# Patient Record
Sex: Female | Born: 1985 | Race: White | Hispanic: No | Marital: Married | Smoking: Former smoker
Health system: Southern US, Community
[De-identification: ages and names within clinical notes are randomized; demographics above are authoritative.]

## PROBLEM LIST (undated history)

## (undated) ENCOUNTER — Inpatient Hospital Stay (HOSPITAL_COMMUNITY): Payer: Self-pay

## (undated) DIAGNOSIS — G43909 Migraine, unspecified, not intractable, without status migrainosus: Secondary | ICD-10-CM

## (undated) DIAGNOSIS — F419 Anxiety disorder, unspecified: Secondary | ICD-10-CM

## (undated) DIAGNOSIS — Z8659 Personal history of other mental and behavioral disorders: Secondary | ICD-10-CM

## (undated) DIAGNOSIS — K529 Noninfective gastroenteritis and colitis, unspecified: Secondary | ICD-10-CM

## (undated) DIAGNOSIS — D241 Benign neoplasm of right breast: Secondary | ICD-10-CM

## (undated) DIAGNOSIS — Z3490 Encounter for supervision of normal pregnancy, unspecified, unspecified trimester: Secondary | ICD-10-CM

## (undated) DIAGNOSIS — K219 Gastro-esophageal reflux disease without esophagitis: Secondary | ICD-10-CM

## (undated) DIAGNOSIS — O24419 Gestational diabetes mellitus in pregnancy, unspecified control: Secondary | ICD-10-CM

## (undated) DIAGNOSIS — F3281 Premenstrual dysphoric disorder: Secondary | ICD-10-CM

## (undated) DIAGNOSIS — Z8632 Personal history of gestational diabetes: Principal | ICD-10-CM

## (undated) DIAGNOSIS — D242 Benign neoplasm of left breast: Secondary | ICD-10-CM

## (undated) DIAGNOSIS — Z8759 Personal history of other complications of pregnancy, childbirth and the puerperium: Secondary | ICD-10-CM

## (undated) HISTORY — DX: Benign neoplasm of right breast: D24.2

## (undated) HISTORY — DX: Personal history of other complications of pregnancy, childbirth and the puerperium: Z87.59

## (undated) HISTORY — DX: Gestational diabetes mellitus in pregnancy, unspecified control: O24.419

## (undated) HISTORY — DX: Encounter for supervision of normal pregnancy, unspecified, unspecified trimester: Z34.90

## (undated) HISTORY — DX: Personal history of other mental and behavioral disorders: Z86.59

## (undated) HISTORY — DX: Personal history of gestational diabetes: Z86.32

## (undated) HISTORY — PX: SEPTOPLASTY: SUR1290

## (undated) HISTORY — PX: MANDIBLE SURGERY: SHX707

## (undated) HISTORY — DX: Premenstrual dysphoric disorder: F32.81

## (undated) HISTORY — DX: Benign neoplasm of right breast: D24.1

## (undated) HISTORY — DX: Noninfective gastroenteritis and colitis, unspecified: K52.9

---

## 2011-09-14 ENCOUNTER — Encounter: Payer: Self-pay | Admitting: Family

## 2011-09-14 ENCOUNTER — Ambulatory Visit (INDEPENDENT_AMBULATORY_CARE_PROVIDER_SITE_OTHER): Payer: Medicaid Other | Admitting: Family

## 2011-09-14 VITALS — BP 133/83 | Wt 186.0 lb

## 2011-09-14 DIAGNOSIS — Z3490 Encounter for supervision of normal pregnancy, unspecified, unspecified trimester: Secondary | ICD-10-CM

## 2011-09-14 DIAGNOSIS — Z3480 Encounter for supervision of other normal pregnancy, unspecified trimester: Secondary | ICD-10-CM

## 2011-09-14 HISTORY — DX: Encounter for supervision of normal pregnancy, unspecified, unspecified trimester: Z34.90

## 2011-09-14 NOTE — Progress Notes (Signed)
Pt transfer from [facility]; no problems during pregnancy; discussed normal growing pains and warning signs.  Records not available at this visit>will obtain.  Schedule anatomy ultrasound;  Reviewed the practice and call coverage.

## 2011-09-18 ENCOUNTER — Ambulatory Visit (HOSPITAL_COMMUNITY)
Admission: RE | Admit: 2011-09-18 | Discharge: 2011-09-18 | Disposition: A | Discharge status: Home / Self Care | Payer: Medicaid Other | Source: Ambulatory Visit | Attending: Family | Admitting: Family

## 2011-09-18 DIAGNOSIS — Z3689 Encounter for other specified antenatal screening: Secondary | ICD-10-CM | POA: Insufficient documentation

## 2011-09-18 DIAGNOSIS — Z3490 Encounter for supervision of normal pregnancy, unspecified, unspecified trimester: Secondary | ICD-10-CM

## 2011-09-19 ENCOUNTER — Encounter: Payer: Self-pay | Admitting: Family

## 2011-10-12 ENCOUNTER — Ambulatory Visit: Payer: Medicaid Other | Admitting: Family

## 2011-10-12 ENCOUNTER — Encounter: Payer: Self-pay | Admitting: Family

## 2011-10-12 VITALS — BP 123/82 | Wt 197.0 lb

## 2011-10-12 DIAGNOSIS — R350 Frequency of micturition: Secondary | ICD-10-CM

## 2011-10-12 DIAGNOSIS — Z3490 Encounter for supervision of normal pregnancy, unspecified, unspecified trimester: Secondary | ICD-10-CM

## 2011-10-12 MED ORDER — NITROFURANTOIN MONOHYD MACRO 100 MG PO CAPS: 100.0000 mg | ORAL_CAPSULE | Freq: Two times a day (BID) | ORAL | Status: AC

## 2011-10-12 NOTE — Progress Notes (Signed)
Routine prenatal check.  Patient has questions about her pulse rate.  Today it is 100, she says its been running around 80 and she is very aware of it, she is concerned about high blood pressure.  She also feels like she is having a UTI, urine dip is negative for leukocytes and blood.  Complains of frequency and burning.

## 2011-10-12 NOTE — Progress Notes (Signed)
Multiple questions and worries about body aches, bilat leg and arm cramps; pulse increases with walking up stairs, no chest pain associated, resolves when activity completed; reassured pt that pregnancy progressing normal ; dysuria>Macrobid, urine culture.

## 2011-10-14 LAB — URINE CULTURE
Colony Count: NO GROWTH
Organism ID, Bacteria: NO GROWTH

## 2011-10-21 ENCOUNTER — Ambulatory Visit (HOSPITAL_COMMUNITY)
Admission: RE | Admit: 2011-10-21 | Discharge: 2011-10-21 | Disposition: A | Discharge status: Home / Self Care | Payer: Medicaid Other | Source: Ambulatory Visit | Attending: Family | Admitting: Family

## 2011-10-21 DIAGNOSIS — Z3490 Encounter for supervision of normal pregnancy, unspecified, unspecified trimester: Secondary | ICD-10-CM

## 2011-10-21 DIAGNOSIS — Z3689 Encounter for other specified antenatal screening: Secondary | ICD-10-CM | POA: Insufficient documentation

## 2011-10-22 ENCOUNTER — Encounter: Payer: Medicaid Other | Admitting: Obstetrics & Gynecology

## 2011-10-26 ENCOUNTER — Encounter: Payer: Self-pay | Admitting: Family

## 2011-11-09 ENCOUNTER — Ambulatory Visit (INDEPENDENT_AMBULATORY_CARE_PROVIDER_SITE_OTHER): Payer: Medicaid Other | Admitting: Advanced Practice Midwife

## 2011-11-09 VITALS — BP 117/77 | Ht 65.0 in | Wt 208.0 lb

## 2011-11-09 DIAGNOSIS — Z3490 Encounter for supervision of normal pregnancy, unspecified, unspecified trimester: Secondary | ICD-10-CM

## 2011-11-09 DIAGNOSIS — Z3400 Encounter for supervision of normal first pregnancy, unspecified trimester: Secondary | ICD-10-CM

## 2011-11-09 LAB — CBC WITH DIFFERENTIAL/PLATELET
Basophils Relative: 1 % (ref 0–1)
Eosinophils Absolute: 0.2 10*3/uL (ref 0.0–0.7)
Eosinophils Relative: 2 % (ref 0–5)
Hemoglobin: 10.4 g/dL — ABNORMAL LOW (ref 12.0–15.0)
Lymphs Abs: 1.7 10*3/uL (ref 0.7–4.0)
MCH: 29.2 pg (ref 26.0–34.0)
MCHC: 33.7 g/dL (ref 30.0–36.0)
MCV: 86.8 fL (ref 78.0–100.0)
Monocytes Relative: 8 % (ref 3–12)
Neutrophils Relative %: 70 % (ref 43–77)
Platelets: 285 10*3/uL (ref 150–400)
RBC: 3.56 MIL/uL — ABNORMAL LOW (ref 3.87–5.11)

## 2011-11-09 NOTE — Patient Instructions (Signed)
Pregnancy - Third Trimester The third trimester of pregnancy (the last 3 months) is a period of the most rapid growth for you and your baby. The baby approaches a length of 20 inches and a weight of 6 to 10 pounds. The baby is adding on fat and getting ready for life outside your body. While inside, babies have periods of sleeping and waking, suck their thumbs, and hiccups. You can often feel small contractions of the uterus. This is false labor. It is also called Braxton-Hicks contractions. This is like a practice for labor. The usual problems in this stage of pregnancy include more difficulty breathing, swelling of the hands and feet from water retention, and having to urinate more often because of the uterus and baby pressing on your bladder.  PRENATAL EXAMS  Blood work may continue to be done during prenatal exams. These tests are done to check on your health and the probable health of your baby. Blood work is used to follow your blood levels (hemoglobin). Anemia (low hemoglobin) is common during pregnancy. Iron and vitamins are given to help prevent this. You may also continue to be checked for diabetes. Some of the past blood tests may be done again.   The size of the uterus is measured during each visit. This makes sure your baby is growing properly according to your pregnancy dates.   Your blood pressure is checked every prenatal visit. This is to make sure you are not getting toxemia.   Your urine is checked every prenatal visit for infection, diabetes and protein.   Your weight is checked at each visit. This is done to make sure gains are happening at the suggested rate and that you and your baby are growing normally.   Sometimes, an ultrasound is performed to confirm the position and the proper growth and development of the baby. This is a test done that bounces harmless sound waves off the baby so your caregiver can more accurately determine due dates.   Discuss the type of pain  medication and anesthesia you will have during your labor and delivery.   Discuss the possibility and anesthesia if a Cesarean Section might be necessary.   Inform your caregiver if there is any mental or physical violence at home.  Sometimes, a specialized non-stress test, contraction stress test and biophysical profile are done to make sure the baby is not having a problem. Checking the amniotic fluid surrounding the baby is called an amniocentesis. The amniotic fluid is removed by sticking a needle into the belly (abdomen). This is sometimes done near the end of pregnancy if an early delivery is required. In this case, it is done to help make sure the baby's lungs are mature enough for the baby to live outside of the womb. If the lungs are not mature and it is unsafe to deliver the baby, an injection of cortisone medication is given to the mother 1 to 2 days before the delivery. This helps the baby's lungs mature and makes it safer to deliver the baby. CHANGES OCCURING IN THE THIRD TRIMESTER OF PREGNANCY Your body goes through many changes during pregnancy. They vary from person to person. Talk to your caregiver about changes you notice and are concerned about.  During the last trimester, you have probably had an increase in your appetite. It is normal to have cravings for certain foods. This varies from person to person and pregnancy to pregnancy.   You may begin to get stretch marks on your hips,   abdomen, and breasts. These are normal changes in the body during pregnancy. There are no exercises or medications to take which prevent this change.   Constipation may be treated with a stool softener or adding bulk to your diet. Drinking lots of fluids, fiber in vegetables, fruits, and whole grains are helpful.   Exercising is also helpful. If you have been very active up until your pregnancy, most of these activities can be continued during your pregnancy. If you have been less active, it is helpful  to start an exercise program such as walking. Consult your caregiver before starting exercise programs.   Avoid all smoking, alcohol, un-prescribed drugs, herbs and "street drugs" during your pregnancy. These chemicals affect the formation and growth of the baby. Avoid chemicals throughout the pregnancy to ensure the delivery of a healthy infant.   Backache, varicose veins and hemorrhoids may develop or get worse.   You will tire more easily in the third trimester, which is normal.   The baby's movements may be stronger and more often.   You may become short of breath easily.   Your belly button may stick out.   A yellow discharge may leak from your breasts called colostrum.   You may have a bloody mucus discharge. This usually occurs a few days to a week before labor begins.  HOME CARE INSTRUCTIONS   Keep your caregiver's appointments. Follow your caregiver's instructions regarding medication use, exercise, and diet.   During pregnancy, you are providing food for you and your baby. Continue to eat regular, well-balanced meals. Choose foods such as meat, fish, milk and other low fat dairy products, vegetables, fruits, and whole-grain breads and cereals. Your caregiver will tell you of the ideal weight gain.   A physical sexual relationship may be continued throughout pregnancy if there are no other problems such as early (premature) leaking of amniotic fluid from the membranes, vaginal bleeding, or belly (abdominal) pain.   Exercise regularly if there are no restrictions. Check with your caregiver if you are unsure of the safety of your exercises. Greater weight gain will occur in the last 2 trimesters of pregnancy. Exercising helps:   Control your weight.   Get you in shape for labor and delivery.   You lose weight after you deliver.   Rest a lot with legs elevated, or as needed for leg cramps or low back pain.   Wear a good support or jogging bra for breast tenderness during  pregnancy. This may help if worn during sleep. Pads or tissues may be used in the bra if you are leaking colostrum.   Do not use hot tubs, steam rooms, or saunas.   Wear your seat belt when driving. This protects you and your baby if you are in an accident.   Avoid raw meat, cat litter boxes and soil used by cats. These carry germs that can cause birth defects in the baby.   It is easier to loose urine during pregnancy. Tightening up and strengthening the pelvic muscles will help with this problem. You can practice stopping your urination while you are going to the bathroom. These are the same muscles you need to strengthen. It is also the muscles you would use if you were trying to stop from passing gas. You can practice tightening these muscles up 10 times a set and repeating this about 3 times per day. Once you know what muscles to tighten up, do not perform these exercises during urination. It is more likely   to cause an infection by backing up the urine.   Ask for help if you have financial, counseling or nutritional needs during pregnancy. Your caregiver will be able to offer counseling for these needs as well as refer you for other special needs.   Make a list of emergency phone numbers and have them available.   Plan on getting help from family or friends when you go home from the hospital.   Make a trial run to the hospital.   Take prenatal classes with the father to understand, practice and ask questions about the labor and delivery.   Prepare the baby's room/nursery.   Do not travel out of the city unless it is absolutely necessary and with the advice of your caregiver.   Wear only low or no heal shoes to have better balance and prevent falling.  MEDICATIONS AND DRUG USE IN PREGNANCY  Take prenatal vitamins as directed. The vitamin should contain 1 milligram of folic acid. Keep all vitamins out of reach of children. Only a couple vitamins or tablets containing iron may be fatal  to a baby or young child when ingested.   Avoid use of all medications, including herbs, over-the-counter medications, not prescribed or suggested by your caregiver. Only take over-the-counter or prescription medicines for pain, discomfort, or fever as directed by your caregiver. Do not use aspirin, ibuprofen (Motrin, Advil, Nuprin) or naproxen (Aleve) unless OK'd by your caregiver.   Let your caregiver also know about herbs you may be using.   Alcohol is related to a number of birth defects. This includes fetal alcohol syndrome. All alcohol, in any form, should be avoided completely. Smoking will cause low birth rate and premature babies.   Street/illegal drugs are very harmful to the baby. They are absolutely forbidden. A baby born to an addicted mother will be addicted at birth. The baby will go through the same withdrawal an adult does.  SEEK MEDICAL CARE IF: You have any concerns or worries during your pregnancy. It is better to call with your questions if you feel they cannot wait, rather than worry about them. DECISIONS ABOUT CIRCUMCISION You may or may not know the sex of your baby. If you know your baby is a boy, it may be time to think about circumcision. Circumcision is the removal of the foreskin of the penis. This is the skin that covers the sensitive end of the penis. There is no proven medical need for this. Often this decision is made on what is popular at the time or based upon religious beliefs and social issues. You can discuss these issues with your caregiver or pediatrician. SEEK IMMEDIATE MEDICAL CARE IF:   An unexplained oral temperature above 102 F (38.9 C) develops, or as your caregiver suggests.   You have leaking of fluid from the vagina (birth canal). If leaking membranes are suspected, take your temperature and tell your caregiver of this when you call.   There is vaginal spotting, bleeding or passing clots. Tell your caregiver of the amount and how many pads are  used.   You develop a bad smelling vaginal discharge with a change in the color from clear to white.   You develop vomiting that lasts more than 24 hours.   You develop chills or fever.   You develop shortness of breath.   You develop burning on urination.   You loose more than 2 pounds of weight or gain more than 2 pounds of weight or as suggested by your   caregiver.   You notice sudden swelling of your face, hands, and feet or legs.   You develop belly (abdominal) pain. Round ligament discomfort is a common non-cancerous (benign) cause of abdominal pain in pregnancy. Your caregiver still must evaluate you.   You develop a severe headache that does not go away.   You develop visual problems, blurred or double vision.   If you have not felt your baby move for more than 1 hour. If you think the baby is not moving as much as usual, eat something with sugar in it and lie down on your left side for an hour. The baby should move at least 4 to 5 times per hour. Call right away if your baby moves less than that.   You fall, are in a car accident or any kind of trauma.   There is mental or physical violence at home.  Document Released: 02/24/2001 Document Revised: 02/19/2011 Document Reviewed: 08/29/2008 [employer] Patient Information 2012 [employer]. 

## 2011-11-09 NOTE — Progress Notes (Signed)
Patient has had some pelvic discomfort, but it is not consistent.  She also has had a cramp twice that was very sharp, like a period cramp.  She has had increase in headaches and loss of appetite since her last visit as well.

## 2011-11-09 NOTE — Progress Notes (Signed)
Doing well. Reviewed treatment for headaches (declines meds now)

## 2011-11-10 LAB — HIV ANTIBODY (ROUTINE TESTING W REFLEX): HIV: NONREACTIVE

## 2011-11-12 LAB — GLUCOSE TOLERANCE, 1 HOUR (50G) W/O FASTING: Glucose, 1 Hour GTT: 150 mg/dL — ABNORMAL HIGH (ref 70–140)

## 2011-11-19 ENCOUNTER — Encounter: Payer: Self-pay | Admitting: Obstetrics & Gynecology

## 2011-11-19 ENCOUNTER — Ambulatory Visit (INDEPENDENT_AMBULATORY_CARE_PROVIDER_SITE_OTHER): Payer: Medicaid Other | Admitting: Obstetrics & Gynecology

## 2011-11-19 VITALS — BP 117/75 | Temp 96.5°F | Wt 212.0 lb

## 2011-11-19 DIAGNOSIS — Z3480 Encounter for supervision of other normal pregnancy, unspecified trimester: Secondary | ICD-10-CM

## 2011-11-19 DIAGNOSIS — R350 Frequency of micturition: Secondary | ICD-10-CM

## 2011-11-19 DIAGNOSIS — O368190 Decreased fetal movements, unspecified trimester, not applicable or unspecified: Secondary | ICD-10-CM

## 2011-11-19 LAB — POCT URINALYSIS DIPSTICK
Bilirubin, UA: NEGATIVE
Blood, UA: NEGATIVE
Glucose, UA: NEGATIVE
Spec Grav, UA: 1.01
pH, UA: 6.5

## 2011-11-19 NOTE — Progress Notes (Signed)
p-99  Lt lower pelvic pain.  Pt is afraid she may have UTI but denies any burning.  She states that baby is moving but not as much today.  Pt is having her 3hr GTT on Monday

## 2011-11-19 NOTE — Progress Notes (Signed)
Pt c/o left lower abdominal pain.  No CVA tenderness.  UA negative.  Cervix closed.  Benign abdominal exam.  Probable round ligament pain.  Pt c/o decreased fetal mvmt.  NST today--reactive GTT on Monday.

## 2011-11-23 ENCOUNTER — Encounter: Payer: Self-pay | Admitting: Obstetrics and Gynecology

## 2011-11-23 ENCOUNTER — Ambulatory Visit (INDEPENDENT_AMBULATORY_CARE_PROVIDER_SITE_OTHER): Payer: Medicaid Other | Admitting: Obstetrics and Gynecology

## 2011-11-23 VITALS — BP 121/73 | Wt 212.0 lb

## 2011-11-23 DIAGNOSIS — R7309 Other abnormal glucose: Secondary | ICD-10-CM

## 2011-11-23 DIAGNOSIS — Z3480 Encounter for supervision of other normal pregnancy, unspecified trimester: Secondary | ICD-10-CM

## 2011-11-23 NOTE — Patient Instructions (Signed)
Gestational Diabetes Mellitus Gestational diabetes mellitus (GDM) is diabetes that occurs only during pregnancy. This happens when the body cannot properly handle the glucose (sugar) that increases in the blood after eating. During pregnancy, insulin resistance (reduced sensitivity to insulin) occurs because of the release of hormones from the placenta. Usually, the pancreas of pregnant women produces enough insulin to overcome the resistance that occurs. However, in gestational diabetes, the insulin is there but it does not work effectively. If the resistance is severe enough that the pancreas does not produce enough insulin, extra glucose builds up in the blood.  WHO IS AT RISK FOR DEVELOPING GESTATIONAL DIABETES?  Women with a history of diabetes in the family.   Women over age 25.   Women who are overweight.   Women in certain ethnic groups (Hispanic, African American, Native American, Asian and Pacific Islander).  WHAT CAN HAPPEN TO THE BABY? If the mother's blood glucose is too high while she is pregnant, the extra sugar will travel through the umbilical cord to the baby. Some of the problems the baby may have are:  Large Baby - If the baby receives too much sugar, the baby will gain more weight. This may cause the baby to be too large to be born normally (vaginally) and a Cesarean section (C-section) may be needed.   Low Blood Glucose (hypoglycemia) - The baby makes extra insulin, in response to the extra sugar its gets from its mother. When the baby is born and no longer needs this extra insulin, the baby's blood glucose level may drop.   Jaundice (yellow coloring of the skin and eyes) - This is fairly common in babies. It is caused from a build-up of the chemical called bilirubin. This is rarely serious, but is seen more often in babies whose mothers had gestational diabetes.  RISKS TO THE MOTHER Women who have had gestational diabetes may be at higher risk for some problems,  including:  Preeclampsia or toxemia, which includes problems with high blood pressure. Blood pressure and protein levels in the urine must be checked frequently.   Infections.   Cesarean section (C-section) for delivery.   Developing Type 2 diabetes later in life. About 30-50% will develop diabetes later, especially if obese.  DIAGNOSIS  The hormones that cause insulin resistance are highest at about 24-28 weeks of pregnancy. If symptoms are experienced, they are much like symptoms you would normally expect during pregnancy.  GDM is often diagnosed using a two part method: 1. After 24-28 weeks of pregnancy, the woman drinks a glucose solution and takes a blood test. If the glucose level is high, a second test will be given.  2. Oral Glucose Tolerance Test (OGTT) which is 3 hours long - After not eating overnight, the blood glucose is checked. The woman drinks a glucose solution, and hourly blood glucose tests are taken.  If the woman has risk factors for GDM, the caregiver may test earlier than 24 weeks of pregnancy. TREATMENT  Treatment of GDM is directed at keeping the mother's blood glucose level normal, and may include:  Meal planning.   Taking insulin or other medicine to control your blood glucose level.   Exercise.   Keeping a daily record of the foods you eat.   Blood glucose monitoring and keeping a record of your blood glucose levels.   May monitor ketone levels in the urine, although this is no longer considered necessary in most pregnancies.  HOME CARE INSTRUCTIONS  While you are pregnant:    Follow your caregiver's advice regarding your prenatal appointments, meal planning, exercise, medicines, vitamins, blood and other tests, and physical activities.   Keep a record of your meals, blood glucose tests, and the amount of insulin you are taking (if any). Show this to your caregiver at every prenatal visit.   If you have GDM, you may have problems with hypoglycemia (low  blood glucose). You may suspect this if you become suddenly dizzy, feel shaky, and/or weak. If you think this is happening and you have a glucose meter, try to test your blood glucose level. Follow your caregiver's advice for when and how to treat your low blood glucose. Generally, the 15:15 rule is followed: Treat by consuming 15 grams of carbohydrates, wait 15 minutes, and recheck blood glucose. Examples of 15 grams of carbohydrates are:   1 cup skim or low-fat milk.    cup juice.   3-4 glucose tablets.   5-6 hard candies.   1 small box raisins.    cup regular soda pop.   Practice good hygiene, to avoid infections.   Do not smoke.  SEEK MEDICAL CARE IF:   You develop abnormal vaginal discharge, with or without itching.   You become weak and tired more than expected.   You seem to sweat a lot.   You have a sudden increase in weight, 5 pounds or more in one week.   You are losing weight, 3 pounds or more in a week.   Your blood glucose level is high, and you need instructions on what to do about it.  SEEK IMMEDIATE MEDICAL CARE IF:   You develop a severe headache.   You faint or pass out.   You develop nausea and vomiting.   You become disoriented or confused.   You have a convulsion.   You develop vision problems.   You develop stomach pain.   You develop vaginal bleeding.   You develop uterine contractions.   You have leaking or a gush of fluid from the vagina.  AFTER YOU HAVE THE BABY:  Go to all of your follow-up appointments, and have blood tests as advised by your caregiver.   Maintain a healthy lifestyle, to prevent diabetes in the future. This includes:   Following a healthy meal plan.   Controlling your weight.   Getting enough exercise and proper rest.   Do not smoke.   Breastfeed your baby if you can. This will lower the chance of you and your baby developing diabetes later in life.  For more information about diabetes, go to the American  Diabetes Association at: www.americandiabetesassociation.org. For more information about gestational diabetes, go to the American Congress of Obstetricians and Gynecologists at: www.acog.org. Document Released: 06/08/2000 Document Revised: 02/19/2011 Document Reviewed: 12/31/2008 [employer] Patient Information 2012 [employer]. 

## 2011-11-23 NOTE — Progress Notes (Signed)
Here for 3 hr GTT. Explained GDM testing and recommended no simple sugars. Had a "glob" of discharge 3 d ago (after VE 4 d ago), none since and no irritation. RLP and ?B-H discussed. Reassured.

## 2011-11-23 NOTE — Progress Notes (Signed)
Patient is here for ob check and 3 hour glucose tolerance testing.

## 2011-11-24 LAB — GLUCOSE TOLERANCE, 3 HOURS
Glucose Tolerance, 1 hour: 216 mg/dL — ABNORMAL HIGH (ref 70–189)
Glucose Tolerance, 2 hour: 145 mg/dL (ref 70–164)
Glucose, GTT - 3 Hour: 158 mg/dL — ABNORMAL HIGH (ref 70–144)

## 2011-12-01 ENCOUNTER — Telehealth: Payer: Self-pay | Admitting: *Deleted

## 2011-12-01 DIAGNOSIS — O24419 Gestational diabetes mellitus in pregnancy, unspecified control: Secondary | ICD-10-CM

## 2011-12-01 MED ORDER — ACCU-CHEK FASTCLIX LANCETS MISC: 1.0000 | Freq: Four times a day (QID) | Status: DC

## 2011-12-01 MED ORDER — GLUCOSE BLOOD VI STRP: ORAL_STRIP | Status: DC

## 2011-12-01 NOTE — Telephone Encounter (Signed)
Pt called requesting a RX be sent to [facility] in [place] for Freestyle light glucose monitoring strips and lancets.  Pt also stated that she went to ED last night for UTI and was given Keflex for 7 days.

## 2011-12-02 ENCOUNTER — Encounter: Payer: Medicaid Other | Attending: Obstetrics & Gynecology | Admitting: *Deleted

## 2011-12-02 VITALS — Ht 65.0 in | Wt 208.0 lb

## 2011-12-02 DIAGNOSIS — O24419 Gestational diabetes mellitus in pregnancy, unspecified control: Secondary | ICD-10-CM

## 2011-12-02 DIAGNOSIS — Z713 Dietary counseling and surveillance: Secondary | ICD-10-CM | POA: Insufficient documentation

## 2011-12-02 DIAGNOSIS — O99810 Abnormal glucose complicating pregnancy: Secondary | ICD-10-CM | POA: Insufficient documentation

## 2011-12-04 ENCOUNTER — Telehealth: Payer: Self-pay

## 2011-12-04 NOTE — Telephone Encounter (Signed)
Called in Diflucan 150 mg p.o to [facility] [phone] for yeast infection due to taking antibiotics.per [id number]. Also confirmed with [facility] that patient should have test stripes and lancets for Accucheck Nano monitor.

## 2011-12-07 ENCOUNTER — Ambulatory Visit (INDEPENDENT_AMBULATORY_CARE_PROVIDER_SITE_OTHER): Payer: Medicaid Other | Admitting: Advanced Practice Midwife

## 2011-12-07 ENCOUNTER — Encounter: Payer: Self-pay | Admitting: Advanced Practice Midwife

## 2011-12-07 VITALS — BP 101/73 | Wt 207.0 lb

## 2011-12-07 DIAGNOSIS — O24419 Gestational diabetes mellitus in pregnancy, unspecified control: Secondary | ICD-10-CM | POA: Insufficient documentation

## 2011-12-07 DIAGNOSIS — O99810 Abnormal glucose complicating pregnancy: Secondary | ICD-10-CM

## 2011-12-07 DIAGNOSIS — R002 Palpitations: Secondary | ICD-10-CM

## 2011-12-07 DIAGNOSIS — Z3480 Encounter for supervision of other normal pregnancy, unspecified trimester: Secondary | ICD-10-CM

## 2011-12-07 NOTE — Patient Instructions (Addendum)
Gestational Diabetes Mellitus Gestational diabetes mellitus (GDM) is diabetes that occurs only during pregnancy. This happens when the body cannot properly handle the glucose (sugar) that increases in the blood after eating. During pregnancy, insulin resistance (reduced sensitivity to insulin) occurs because of the release of hormones from the placenta. Usually, the pancreas of pregnant women produces enough insulin to overcome the resistance that occurs. However, in gestational diabetes, the insulin is there but it does not work effectively. If the resistance is severe enough that the pancreas does not produce enough insulin, extra glucose builds up in the blood.  WHO IS AT RISK FOR DEVELOPING GESTATIONAL DIABETES?  Women with a history of diabetes in the family.   Women over age 25.   Women who are overweight.   Women in certain ethnic groups (Hispanic, African American, Native American, Asian and Pacific Islander).  WHAT CAN HAPPEN TO THE BABY? If the mother's blood glucose is too high while she is pregnant, the extra sugar will travel through the umbilical cord to the baby. Some of the problems the baby may have are:  Large Baby - If the baby receives too much sugar, the baby will gain more weight. This may cause the baby to be too large to be born normally (vaginally) and a Cesarean section (C-section) may be needed.   Low Blood Glucose (hypoglycemia) - The baby makes extra insulin, in response to the extra sugar its gets from its mother. When the baby is born and no longer needs this extra insulin, the baby's blood glucose level may drop.   Jaundice (yellow coloring of the skin and eyes) - This is fairly common in babies. It is caused from a build-up of the chemical called bilirubin. This is rarely serious, but is seen more often in babies whose mothers had gestational diabetes.  RISKS TO THE MOTHER Women who have had gestational diabetes may be at higher risk for some problems,  including:  Preeclampsia or toxemia, which includes problems with high blood pressure. Blood pressure and protein levels in the urine must be checked frequently.   Infections.   Cesarean section (C-section) for delivery.   Developing Type 2 diabetes later in life. About 30-50% will develop diabetes later, especially if obese.  DIAGNOSIS  The hormones that cause insulin resistance are highest at about 24-28 weeks of pregnancy. If symptoms are experienced, they are much like symptoms you would normally expect during pregnancy.  GDM is often diagnosed using a two part method: 1. After 24-28 weeks of pregnancy, the woman drinks a glucose solution and takes a blood test. If the glucose level is high, a second test will be given.  2. Oral Glucose Tolerance Test (OGTT) which is 3 hours long - After not eating overnight, the blood glucose is checked. The woman drinks a glucose solution, and hourly blood glucose tests are taken.  If the woman has risk factors for GDM, the caregiver may test earlier than 24 weeks of pregnancy. TREATMENT  Treatment of GDM is directed at keeping the mother's blood glucose level normal, and may include:  Meal planning.   Taking insulin or other medicine to control your blood glucose level.   Exercise.   Keeping a daily record of the foods you eat.   Blood glucose monitoring and keeping a record of your blood glucose levels.   May monitor ketone levels in the urine, although this is no longer considered necessary in most pregnancies.  HOME CARE INSTRUCTIONS  While you are pregnant:    Follow your caregiver's advice regarding your prenatal appointments, meal planning, exercise, medicines, vitamins, blood and other tests, and physical activities.   Keep a record of your meals, blood glucose tests, and the amount of insulin you are taking (if any). Show this to your caregiver at every prenatal visit.   If you have GDM, you may have problems with hypoglycemia (low  blood glucose). You may suspect this if you become suddenly dizzy, feel shaky, and/or weak. If you think this is happening and you have a glucose meter, try to test your blood glucose level. Follow your caregiver's advice for when and how to treat your low blood glucose. Generally, the 15:15 rule is followed: Treat by consuming 15 grams of carbohydrates, wait 15 minutes, and recheck blood glucose. Examples of 15 grams of carbohydrates are:   1 cup skim or low-fat milk.    cup juice.   3-4 glucose tablets.   5-6 hard candies.   1 small box raisins.    cup regular soda pop.   Practice good hygiene, to avoid infections.   Do not smoke.  SEEK MEDICAL CARE IF:   You develop abnormal vaginal discharge, with or without itching.   You become weak and tired more than expected.   You seem to sweat a lot.   You have a sudden increase in weight, 5 pounds or more in one week.   You are losing weight, 3 pounds or more in a week.   Your blood glucose level is high, and you need instructions on what to do about it.  SEEK IMMEDIATE MEDICAL CARE IF:   You develop a severe headache.   You faint or pass out.   You develop nausea and vomiting.   You become disoriented or confused.   You have a convulsion.   You develop vision problems.   You develop stomach pain.   You develop vaginal bleeding.   You develop uterine contractions.   You have leaking or a gush of fluid from the vagina.  AFTER YOU HAVE THE BABY:  Go to all of your follow-up appointments, and have blood tests as advised by your caregiver.   Maintain a healthy lifestyle, to prevent diabetes in the future. This includes:   Following a healthy meal plan.   Controlling your weight.   Getting enough exercise and proper rest.   Do not smoke.   Breastfeed your baby if you can. This will lower the chance of you and your baby developing diabetes later in life.  For more information about diabetes, go to the American  Diabetes Association at: www.americandiabetesassociation.org. For more information about gestational diabetes, go to the American Congress of Obstetricians and Gynecologists at: www.acog.org. Document Released: 06/08/2000 Document Revised: 02/19/2011 Document Reviewed: 12/31/2008 Exit[employer] Patient Information 2012 [employer]. 

## 2011-12-07 NOTE — Progress Notes (Signed)
p-[id number]  Some vaginal itching.  Was prescribed Diflucan but did not take.

## 2011-12-07 NOTE — Progress Notes (Signed)
FBS 80, 87,90,80   Breakfast 103,105, 117,117  Lunch 104,107,118,111  Dinner 103,96,98,104,104    Reviewed diet issues.  Lots of questions about induction, vaccines, etc. C/O intermittent left upper chest pain with no SOB or dizziness. Has occurred for 5 days. Worried. Not having it right now. Encouraged can go to ED/MAU if worried. Also has some palpitations. Will put in Cardiology consult if pt desires to have it checked out.

## 2011-12-08 NOTE — Progress Notes (Signed)
  Patient was seen on 12/02/2011 for Gestational Diabetes self-management class at the [facility]. The following learning objectives were met by the patient during this course:   States the definition of Gestational Diabetes  States why dietary management is important in controlling blood glucose  Describes the effects each nutrient has on blood glucose levels  Demonstrates ability to create a balanced meal plan  Demonstrates carbohydrate counting   States when to check blood glucose levels  Demonstrates proper blood glucose monitoring techniques  States the effect of stress and exercise on blood glucose levels  States the importance of limiting caffeine and abstaining from alcohol and smoking  Blood glucose monitor given: Accu Chek Nano BG Monitoring Kit Lot # [id number] Exp: 04/2015 Blood glucose reading: 103 mg/dl  Patient instructed to monitor glucose levels: FBS: 60 - <90 2 hour: <120  *Patient received handouts:  Nutrition Diabetes and Pregnancy  Carbohydrate Counting List  Patient will be seen for follow-up as needed.

## 2011-12-08 NOTE — Patient Instructions (Signed)
Goals:  Check glucose levels per MD as instructed  Follow Gestational Diabetes Diet as instructed  Call for follow-up as needed    

## 2011-12-21 ENCOUNTER — Encounter: Payer: Self-pay | Admitting: Advanced Practice Midwife

## 2011-12-21 ENCOUNTER — Ambulatory Visit (INDEPENDENT_AMBULATORY_CARE_PROVIDER_SITE_OTHER): Payer: Medicaid Other | Admitting: Advanced Practice Midwife

## 2011-12-21 VITALS — BP 128/72 | Temp 97.1°F | Wt 208.0 lb

## 2011-12-21 DIAGNOSIS — Z3480 Encounter for supervision of other normal pregnancy, unspecified trimester: Secondary | ICD-10-CM

## 2011-12-21 DIAGNOSIS — O99810 Abnormal glucose complicating pregnancy: Secondary | ICD-10-CM

## 2011-12-21 DIAGNOSIS — O24419 Gestational diabetes mellitus in pregnancy, unspecified control: Secondary | ICD-10-CM

## 2011-12-21 NOTE — Progress Notes (Signed)
Ketones-trace  p-87

## 2011-12-21 NOTE — Patient Instructions (Signed)

## 2011-12-21 NOTE — Progress Notes (Signed)
Doing well. Some frustration and anxiety.  Does not have meter with her but  Book shows FBSs mostly 80s with one 92.   B = 110s-120 with one 133 when she ate a biscuit.  L = mostly teens and under 120   D = Under 120s with one 122. Misses chocolate. Told her she can try 1-2 hershey kisses with a protein like cheese and see how her next sugar does.  If it is elevated, will abstain. Also discussed alternatives like fruit/cream.  Already using cinnamon. Vertex verified by bedside US for pt reassurance. Size = Dates today.

## 2011-12-29 ENCOUNTER — Encounter: Payer: Self-pay | Admitting: *Deleted

## 2011-12-29 ENCOUNTER — Encounter: Payer: Self-pay | Admitting: Cardiology

## 2011-12-30 ENCOUNTER — Ambulatory Visit (INDEPENDENT_AMBULATORY_CARE_PROVIDER_SITE_OTHER): Payer: Medicaid Other | Admitting: Cardiology

## 2011-12-30 ENCOUNTER — Encounter: Payer: Self-pay | Admitting: Cardiology

## 2011-12-30 VITALS — BP 107/67 | HR 92 | Wt 207.0 lb

## 2011-12-30 DIAGNOSIS — R0609 Other forms of dyspnea: Secondary | ICD-10-CM

## 2011-12-30 DIAGNOSIS — R0600 Dyspnea, unspecified: Secondary | ICD-10-CM

## 2011-12-30 DIAGNOSIS — R002 Palpitations: Secondary | ICD-10-CM

## 2011-12-30 DIAGNOSIS — R0989 Other specified symptoms and signs involving the circulatory and respiratory systems: Secondary | ICD-10-CM

## 2011-12-30 DIAGNOSIS — R079 Chest pain, unspecified: Secondary | ICD-10-CM

## 2011-12-30 NOTE — Assessment & Plan Note (Signed)
Plan echocardiogram to assess LV function. 

## 2011-12-30 NOTE — Assessment & Plan Note (Signed)
Symptoms are atypical with questionable etiology. They are not consistent with cardiac etiology. Question musculoskeletal.

## 2011-12-30 NOTE — Assessment & Plan Note (Signed)
Plan check echocardiogram. Symptoms sound to be PACs or PVCs. I have explained that if LV function normal these are benign. We will consider a monitor in the future if her symptoms worsen. We could also consider a beta blocker in the future if her symptoms worsen.

## 2011-12-30 NOTE — Patient Instructions (Addendum)
Your physician wants you to follow-up in: 6 MONTHS WITH DR Jens Som IN [place] You will receive a reminder letter in the mail two months in advance. If you don't receive a letter, please call our office to schedule the follow-up appointment.   Your physician has requested that you have an echocardiogram. Echocardiography is a painless test that uses sound waves to create images of your heart. It provides your doctor with information about the size and shape of your heart and how well your heart's chambers and valves are working. This procedure takes approximately one hour. There are no restrictions for this procedure.AT [facility] OFFICE

## 2011-12-30 NOTE — Progress Notes (Signed)
  HPI: extremely pleasant 26 year old female for evaluation of chest pain, palpitations and dyspnea. The patient has had intermittent chest pain for 2-1/2 years. It is in the left upper chest and radiates across her chest. It can last all day at times. No associated symptoms. The pain can increase with inspiration. It is not related to food or exertion. Resolves spontaneously. She also has had occasional palpitations. These are described as a skip and nonsustained. No syncope. Since she has been pregnant she has noticed increasing dyspnea on exertion. No pedal edema. Because of the above we were asked to evaluate.  Current Outpatient Prescriptions  Medication Sig Dispense Refill  . ACCU-CHEK FASTCLIX LANCETS MISC 1 Device by Percutaneous route 4 (four) times daily.  100 each  12  . acetaminophen (TYLENOL) 325 MG tablet Take 650 mg by mouth as needed.      . diphenhydrAMINE (BENADRYL) 25 MG tablet Take 25 mg by mouth as needed.      Marland Kitchen glucose blood test strip Freestyle light monitoring strips.  Use QID  100 each  12  . loratadine (CLARITIN) 10 MG tablet Take 10 mg by mouth as needed.       . Prenatal Vit-Fe Fumarate-FA (MULTIVITAMIN-PRENATAL) 27-0.8 MG TABS Take 1 tablet by mouth daily.        No Known Allergies  Past Medical History  Diagnosis Date  . Supervision of normal pregnancy 09/14/2011    [name] ([facility] [place]) Genetic Screen  Reports negative  Anatomic [place]  Normal @ 20 wks; poor visual of spine>rescan nml  Glucose Screen 150 >> needs 3 hr GTT  GBS   Feeding Preference  Breast  Contraception   Circumcision       . Gestational diabetes 12/07/2011    Diet controlled, excellent control.   Deliver at 40wks   . Colitis     Past Surgical History  Procedure Date  . 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 Marital Status: Married    Spouse Name: N/A    Number of Children: N/A  . Years of Education: N/A    Occupational History  .      Homemaker   Social History Main Topics  . Smoking status: Former Games developer  . Smokeless tobacco: Not on file  . Alcohol Use: No  . Drug Use: No  . Sexually Active: Yes   Other Topics Concern  . Not on file   Social History Narrative  . No narrative on file    Family History  Problem Relation Age of Onset  . Hypertension Mother     ROS: headaches but no fevers or chills, productive cough, hemoptysis, dysphasia, odynophagia, melena, hematochezia, dysuria, hematuria, rash, seizure activity, orthopnea, PND, pedal edema, claudication. Remaining systems are negative.  Physical Exam:   Blood pressure 107/67, pulse 92, weight 207 lb (93.895 kg), last menstrual period 04/25/2011.  General:  Well developed/well nourished in NAD Skin warm/dry Patient not depressed No peripheral clubbing Back-normal HEENT-normal/normal eyelids Neck supple/normal carotid upstroke bilaterally; no bruits; no JVD; no thyromegaly chest - CTA/ normal expansion CV - RRR/normal S1 and S2; no murmurs, rubs or gallops;  PMI nondisplaced Abdomen -NT/ND, no HSM, + bowel sounds, no bruit, 35 week intrauterine pregnancy 2+ femoral pulses, no bruits Ext-no edema, chords, 2+ DP Neuro-grossly nonfocal  ECG sinus rhythm at a rate of 92. Nonspecific ST changes.

## 2012-01-04 ENCOUNTER — Ambulatory Visit (INDEPENDENT_AMBULATORY_CARE_PROVIDER_SITE_OTHER): Payer: Medicaid Other | Admitting: Obstetrics and Gynecology

## 2012-01-04 VITALS — BP 114/75 | Temp 97.2°F | Wt 209.0 lb

## 2012-01-04 DIAGNOSIS — Z3400 Encounter for supervision of normal first pregnancy, unspecified trimester: Secondary | ICD-10-CM

## 2012-01-04 LAB — OB RESULTS CONSOLE GBS: GBS: NEGATIVE

## 2012-01-04 NOTE — Patient Instructions (Signed)
Pregnancy - Third Trimester  The third trimester of pregnancy (the last 3 months) is a period of the most rapid growth for you and your baby. The baby approaches a length of 20 inches and a weight of 6 to 10 pounds. The baby is adding on fat and getting ready for life outside your body. While inside, babies have periods of sleeping and waking, suck their thumbs, and hiccups. You can often feel small contractions of the uterus. This is false labor. It is also called Braxton-Hicks contractions. This is like a practice for labor. The usual problems in this stage of pregnancy include more difficulty breathing, swelling of the hands and feet from water retention, and having to urinate more often because of the uterus and baby pressing on your bladder.   PRENATAL EXAMS  · Blood work may continue to be done during prenatal exams. These tests are done to check on your health and the probable health of your baby. Blood work is used to follow your blood levels (hemoglobin). Anemia (low hemoglobin) is common during pregnancy. Iron and vitamins are given to help prevent this. You may also continue to be checked for diabetes. Some of the past blood tests may be done again.  · The size of the uterus is measured during each visit. This makes sure your baby is growing properly according to your pregnancy dates.  · Your blood pressure is checked every prenatal visit. This is to make sure you are not getting toxemia.  · Your urine is checked every prenatal visit for infection, diabetes and protein.  · Your weight is checked at each visit. This is done to make sure gains are happening at the suggested rate and that you and your baby are growing normally.  · Sometimes, an ultrasound is performed to confirm the position and the proper growth and development of the baby. This is a test done that bounces harmless sound waves off the baby so your caregiver can more accurately determine due dates.  · Discuss the type of pain medication and  anesthesia you will have during your labor and delivery.  · Discuss the possibility and anesthesia if a Cesarean Section might be necessary.  · Inform your caregiver if there is any mental or physical violence at home.  Sometimes, a specialized non-stress test, contraction stress test and biophysical profile are done to make sure the baby is not having a problem. Checking the amniotic fluid surrounding the baby is called an amniocentesis. The amniotic fluid is removed by sticking a needle into the belly (abdomen). This is sometimes done near the end of pregnancy if an early delivery is required. In this case, it is done to help make sure the baby's lungs are mature enough for the baby to live outside of the womb. If the lungs are not mature and it is unsafe to deliver the baby, an injection of cortisone medication is given to the mother 1 to 2 days before the delivery. This helps the baby's lungs mature and makes it safer to deliver the baby.  CHANGES OCCURING IN THE THIRD TRIMESTER OF PREGNANCY  Your body goes through many changes during pregnancy. They vary from person to person. Talk to your caregiver about changes you notice and are concerned about.  · During the last trimester, you have probably had an increase in your appetite. It is normal to have cravings for certain foods. This varies from person to person and pregnancy to pregnancy.  · You may begin to   get stretch marks on your hips, abdomen, and breasts. These are normal changes in the body during pregnancy. There are no exercises or medications to take which prevent this change.  · Constipation may be treated with a stool softener or adding bulk to your diet. Drinking lots of fluids, fiber in vegetables, fruits, and whole grains are helpful.  · Exercising is also helpful. If you have been very active up until your pregnancy, most of these activities can be continued during your pregnancy. If you have been less active, it is helpful to start an exercise  program such as walking. Consult your caregiver before starting exercise programs.  · Avoid all smoking, alcohol, un-prescribed drugs, herbs and "street drugs" during your pregnancy. These chemicals affect the formation and growth of the baby. Avoid chemicals throughout the pregnancy to ensure the delivery of a healthy infant.  · Backache, varicose veins and hemorrhoids may develop or get worse.  · You will tire more easily in the third trimester, which is normal.  · The baby's movements may be stronger and more often.  · You may become short of breath easily.  · Your belly button may stick out.  · A yellow discharge may leak from your breasts called colostrum.  · You may have a bloody mucus discharge. This usually occurs a few days to a week before labor begins.  HOME CARE INSTRUCTIONS   · Keep your caregiver's appointments. Follow your caregiver's instructions regarding medication use, exercise, and diet.  · During pregnancy, you are providing food for you and your baby. Continue to eat regular, well-balanced meals. Choose foods such as meat, fish, milk and other low fat dairy products, vegetables, fruits, and whole-grain breads and cereals. Your caregiver will tell you of the ideal weight gain.  · A physical sexual relationship may be continued throughout pregnancy if there are no other problems such as early (premature) leaking of amniotic fluid from the membranes, vaginal bleeding, or belly (abdominal) pain.  · Exercise regularly if there are no restrictions. Check with your caregiver if you are unsure of the safety of your exercises. Greater weight gain will occur in the last 2 trimesters of pregnancy. Exercising helps:  · Control your weight.  · Get you in shape for labor and delivery.  · You lose weight after you deliver.  · Rest a lot with legs elevated, or as needed for leg cramps or low back pain.  · Wear a good support or jogging bra for breast tenderness during pregnancy. This may help if worn during  sleep. Pads or tissues may be used in the bra if you are leaking colostrum.  · Do not use hot tubs, steam rooms, or saunas.  · Wear your seat belt when driving. This protects you and your baby if you are in an accident.  · Avoid raw meat, cat litter boxes and soil used by cats. These carry germs that can cause birth defects in the baby.  · It is easier to loose urine during pregnancy. Tightening up and strengthening the pelvic muscles will help with this problem. You can practice stopping your urination while you are going to the bathroom. These are the same muscles you need to strengthen. It is also the muscles you would use if you were trying to stop from passing gas. You can practice tightening these muscles up 10 times a set and repeating this about 3 times per day. Once you know what muscles to tighten up, do not perform these   exercises during urination. It is more likely to cause an infection by backing up the urine.  · Ask for help if you have financial, counseling or nutritional needs during pregnancy. Your caregiver will be able to offer counseling for these needs as well as refer you for other special needs.  · Make a list of emergency phone numbers and have them available.  · Plan on getting help from family or friends when you go home from the hospital.  · Make a trial run to the hospital.  · Take prenatal classes with the father to understand, practice and ask questions about the labor and delivery.  · Prepare the baby's room/nursery.  · Do not travel out of the city unless it is absolutely necessary and with the advice of your caregiver.  · Wear only low or no heal shoes to have better balance and prevent falling.  MEDICATIONS AND DRUG USE IN PREGNANCY  · Take prenatal vitamins as directed. The vitamin should contain 1 milligram of folic acid. Keep all vitamins out of reach of children. Only a couple vitamins or tablets containing iron may be fatal to a baby or young child when ingested.  · Avoid use  of all medications, including herbs, over-the-counter medications, not prescribed or suggested by your caregiver. Only take over-the-counter or prescription medicines for pain, discomfort, or fever as directed by your caregiver. Do not use aspirin, ibuprofen (Motrin®, Advil®, Nuprin®) or naproxen (Aleve®) unless OK'd by your caregiver.  · Let your caregiver also know about herbs you may be using.  · Alcohol is related to a number of birth defects. This includes fetal alcohol syndrome. All alcohol, in any form, should be avoided completely. Smoking will cause low birth rate and premature babies.  · Street/illegal drugs are very harmful to the baby. They are absolutely forbidden. A baby born to an addicted mother will be addicted at birth. The baby will go through the same withdrawal an adult does.  SEEK MEDICAL CARE IF:  You have any concerns or worries during your pregnancy. It is better to call with your questions if you feel they cannot wait, rather than worry about them.  DECISIONS ABOUT CIRCUMCISION  You may or may not know the sex of your baby. If you know your baby is a boy, it may be time to think about circumcision. Circumcision is the removal of the foreskin of the penis. This is the skin that covers the sensitive end of the penis. There is no proven medical need for this. Often this decision is made on what is popular at the time or based upon religious beliefs and social issues. You can discuss these issues with your caregiver or pediatrician.  SEEK IMMEDIATE MEDICAL CARE IF:   · An unexplained oral temperature above 102° F (38.9° C) develops, or as your caregiver suggests.  · You have leaking of fluid from the vagina (birth canal). If leaking membranes are suspected, take your temperature and tell your caregiver of this when you call.  · There is vaginal spotting, bleeding or passing clots. Tell your caregiver of the amount and how many pads are used.  · You develop a bad smelling vaginal discharge with  a change in the color from clear to white.  · You develop vomiting that lasts more than 24 hours.  · You develop chills or fever.  · You develop shortness of breath.  · You develop burning on urination.  · You loose more than 2 pounds of weight   or gain more than 2 pounds of weight or as suggested by your caregiver.  · You notice sudden swelling of your face, hands, and feet or legs.  · You develop belly (abdominal) pain. Round ligament discomfort is a common non-cancerous (benign) cause of abdominal pain in pregnancy. Your caregiver still must evaluate you.  · You develop a severe headache that does not go away.  · You develop visual problems, blurred or double vision.  · If you have not felt your baby move for more than 1 hour. If you think the baby is not moving as much as usual, eat something with sugar in it and lie down on your left side for an hour. The baby should move at least 4 to 5 times per hour. Call right away if your baby moves less than that.  · You fall, are in a car accident or any kind of trauma.  · There is mental or physical violence at home.  Document Released: 02/24/2001 Document Revised: 05/25/2011 Document Reviewed: 08/29/2008  [employer]® Patient Information ©2013 [employer].

## 2012-01-04 NOTE — Progress Notes (Signed)
p=101 

## 2012-01-04 NOTE — Progress Notes (Signed)
A1GDM: CBGs all within target range. Reviewed diet. Discussed delivery by 40 wks. Cultures done. Lots of questions about labor and birth - reviewed at length and reassured.

## 2012-01-05 LAB — GC/CHLAMYDIA PROBE AMP, GENITAL
Chlamydia, DNA Probe: NEGATIVE
GC Probe Amp, Genital: NEGATIVE

## 2012-01-07 LAB — CULTURE, BETA STREP (GROUP B ONLY)

## 2012-01-13 ENCOUNTER — Ambulatory Visit (HOSPITAL_COMMUNITY): Payer: Medicaid Other

## 2012-01-13 ENCOUNTER — Other Ambulatory Visit (HOSPITAL_COMMUNITY): Payer: Medicaid Other

## 2012-01-14 ENCOUNTER — Ambulatory Visit (INDEPENDENT_AMBULATORY_CARE_PROVIDER_SITE_OTHER): Payer: Medicaid Other | Admitting: Obstetrics & Gynecology

## 2012-01-14 ENCOUNTER — Encounter: Payer: Self-pay | Admitting: Obstetrics & Gynecology

## 2012-01-14 VITALS — BP 117/78 | Temp 96.7°F | Wt 212.0 lb

## 2012-01-14 DIAGNOSIS — Z3480 Encounter for supervision of other normal pregnancy, unspecified trimester: Secondary | ICD-10-CM

## 2012-01-14 DIAGNOSIS — O99810 Abnormal glucose complicating pregnancy: Secondary | ICD-10-CM

## 2012-01-14 DIAGNOSIS — Z3490 Encounter for supervision of normal pregnancy, unspecified, unspecified trimester: Secondary | ICD-10-CM

## 2012-01-14 DIAGNOSIS — O24419 Gestational diabetes mellitus in pregnancy, unspecified control: Secondary | ICD-10-CM

## 2012-01-14 NOTE — Progress Notes (Signed)
Routine visit. No OB complaints. She has a rash, present for "months", itchy, using cortisone and benadry. I offered a derm consult, but they want to wait until after delivery. Labor precautions reviewed. She reports good FM. She did not bring her sugars.

## 2012-01-14 NOTE — Progress Notes (Signed)
p-72  Rash on arms and chest.  Wants cervical check today.

## 2012-01-14 NOTE — Progress Notes (Signed)
p-84  Wants cervical check today

## 2012-01-22 ENCOUNTER — Ambulatory Visit (INDEPENDENT_AMBULATORY_CARE_PROVIDER_SITE_OTHER): Payer: Medicaid Other | Admitting: Family

## 2012-01-22 VITALS — BP 123/76 | Wt 209.0 lb

## 2012-01-22 DIAGNOSIS — Z3480 Encounter for supervision of other normal pregnancy, unspecified trimester: Secondary | ICD-10-CM

## 2012-01-22 DIAGNOSIS — Z3490 Encounter for supervision of normal pregnancy, unspecified, unspecified trimester: Secondary | ICD-10-CM

## 2012-01-22 NOTE — Progress Notes (Signed)
p=86 

## 2012-01-22 NOTE — Progress Notes (Signed)
Pt reports continued itching and rash on abdomen and arms; obtain bile acids and cmp; blood sugars wnl; plan for induction of labor at 40 wks.

## 2012-01-23 LAB — COMPREHENSIVE METABOLIC PANEL
AST: 16 U/L (ref 0–37)
Albumin: 3.4 g/dL — ABNORMAL LOW (ref 3.5–5.2)
Alkaline Phosphatase: 132 U/L — ABNORMAL HIGH (ref 39–117)
BUN: 9 mg/dL (ref 6–23)
Creat: 0.69 mg/dL (ref 0.50–1.10)
Glucose, Bld: 42 mg/dL — CL (ref 70–99)
Total Bilirubin: 0.3 mg/dL (ref 0.3–1.2)

## 2012-01-25 ENCOUNTER — Telehealth: Payer: Self-pay | Admitting: *Deleted

## 2012-01-25 NOTE — Telephone Encounter (Signed)
Pt called with concerns that after she had her membranes swept on Friday, that night she had what sounded like her mucus plug expressed.  She has noticed the mucus several times since and she stated that she was concerned.  Reassured pt that it was normal.  Sometimes the plug can come out in chunks.  She has had some inconsistant contractions.  Reviewed the S&S of labor.  Pt is feeling baby move.  I told pt that if she should seeing bleeding like a period to go to the nearest hospital.  I offered pt an appt today for NST but she said due to gas she would just wait until her appt tomorrow.

## 2012-01-26 ENCOUNTER — Ambulatory Visit (INDEPENDENT_AMBULATORY_CARE_PROVIDER_SITE_OTHER): Payer: Medicaid Other | Admitting: Obstetrics & Gynecology

## 2012-01-26 VITALS — BP 123/78 | Temp 96.9°F | Wt 211.0 lb

## 2012-01-26 DIAGNOSIS — O24419 Gestational diabetes mellitus in pregnancy, unspecified control: Secondary | ICD-10-CM

## 2012-01-26 DIAGNOSIS — O99810 Abnormal glucose complicating pregnancy: Secondary | ICD-10-CM

## 2012-01-26 NOTE — Progress Notes (Signed)
p-78  Pt states that she has lost her mucus plug over the last couple of days.  She is here to discuss an induction per [facility]

## 2012-01-26 NOTE — Progress Notes (Signed)
Pt has not complaints.  [name].  Induce at 40 weeks.  Did not bring CBG log, but she said all have been within range.  CBG today 1 hour postprandial = 115 7:30 p.m. Foley bulb on 01/30/12.  Morrie Sheldon on L&D took order for induction

## 2012-01-27 ENCOUNTER — Encounter: Payer: Self-pay | Admitting: *Deleted

## 2012-01-27 ENCOUNTER — Ambulatory Visit (HOSPITAL_COMMUNITY)
Admission: RE | Admit: 2012-01-27 | Discharge: 2012-01-27 | Disposition: A | Discharge status: Home / Self Care | Payer: Medicaid Other | Source: Ambulatory Visit | Attending: Obstetrics & Gynecology | Admitting: Obstetrics & Gynecology

## 2012-01-27 ENCOUNTER — Telehealth (HOSPITAL_COMMUNITY): Payer: Self-pay | Admitting: *Deleted

## 2012-01-27 ENCOUNTER — Encounter: Payer: Medicaid Other | Admitting: Obstetrics & Gynecology

## 2012-01-27 DIAGNOSIS — Z3689 Encounter for other specified antenatal screening: Secondary | ICD-10-CM | POA: Insufficient documentation

## 2012-01-27 DIAGNOSIS — O99810 Abnormal glucose complicating pregnancy: Secondary | ICD-10-CM | POA: Insufficient documentation

## 2012-01-27 DIAGNOSIS — O24419 Gestational diabetes mellitus in pregnancy, unspecified control: Secondary | ICD-10-CM

## 2012-01-27 NOTE — Addendum Note (Signed)
Addended by: Granville Lewis on: 01/27/2012 04:12 PM   Modules accepted: Orders

## 2012-01-27 NOTE — Telephone Encounter (Signed)
Preadmission screen  

## 2012-01-28 ENCOUNTER — Encounter (HOSPITAL_COMMUNITY): Payer: Self-pay | Admitting: Advanced Practice Midwife

## 2012-01-28 ENCOUNTER — Telehealth: Payer: Self-pay | Admitting: *Deleted

## 2012-01-28 ENCOUNTER — Inpatient Hospital Stay (HOSPITAL_COMMUNITY)
Admission: AD | Admit: 2012-01-28 | Discharge: 2012-02-02 | DRG: 766 | Disposition: A | Discharge status: Home / Self Care | Payer: Medicaid Other | Source: Ambulatory Visit | Attending: Family Medicine | Admitting: Family Medicine

## 2012-01-28 DIAGNOSIS — O4100X0 Oligohydramnios, unspecified trimester, not applicable or unspecified: Secondary | ICD-10-CM | POA: Diagnosis present

## 2012-01-28 DIAGNOSIS — O99814 Abnormal glucose complicating childbirth: Secondary | ICD-10-CM | POA: Diagnosis present

## 2012-01-28 DIAGNOSIS — Z98891 History of uterine scar from previous surgery: Secondary | ICD-10-CM

## 2012-01-28 DIAGNOSIS — Z3490 Encounter for supervision of normal pregnancy, unspecified, unspecified trimester: Secondary | ICD-10-CM

## 2012-01-28 DIAGNOSIS — O24419 Gestational diabetes mellitus in pregnancy, unspecified control: Secondary | ICD-10-CM

## 2012-01-28 DIAGNOSIS — O324XX0 Maternal care for high head at term, not applicable or unspecified: Secondary | ICD-10-CM | POA: Diagnosis present

## 2012-01-28 LAB — CBC
HCT: 36.8 % (ref 36.0–46.0)
MCH: 28.5 pg (ref 26.0–34.0)
MCHC: 33.4 g/dL (ref 30.0–36.0)
MCV: 85.2 fL (ref 78.0–100.0)
RDW: 14.1 % (ref 11.5–15.5)

## 2012-01-28 LAB — GLUCOSE, CAPILLARY: Glucose-Capillary: 102 mg/dL — ABNORMAL HIGH (ref 70–99)

## 2012-01-28 LAB — OBSTETRIC PANEL
Antibody Screen: NEGATIVE
Basophils Absolute: 0 10*3/uL (ref 0.0–0.1)
HCT: 33.2 % — ABNORMAL LOW (ref 36.0–46.0)
Hemoglobin: 11.2 g/dL — ABNORMAL LOW (ref 12.0–15.0)
Lymphocytes Relative: 31 % (ref 12–46)
Lymphs Abs: 1.9 10*3/uL (ref 0.7–4.0)
Monocytes Absolute: 0.4 10*3/uL (ref 0.1–1.0)
Neutro Abs: 3.8 10*3/uL (ref 1.7–7.7)
RBC: 3.98 MIL/uL (ref 3.87–5.11)
RDW: 14.7 % (ref 11.5–15.5)
Rubella: 7.4 IU/mL — ABNORMAL HIGH
WBC: 6.3 10*3/uL (ref 4.0–10.5)

## 2012-01-28 MED ORDER — IBUPROFEN 600 MG PO TABS: 600.0000 mg | ORAL_TABLET | Freq: Four times a day (QID) | ORAL | Status: DC | PRN

## 2012-01-28 MED ORDER — HYDROXYZINE HCL 50 MG/ML IM SOLN: 50.0000 mg | Freq: Four times a day (QID) | INTRAMUSCULAR | Status: DC | PRN

## 2012-01-28 MED ORDER — ONDANSETRON HCL 4 MG/2ML IJ SOLN
4.0000 mg | Freq: Four times a day (QID) | INTRAMUSCULAR | Status: DC | PRN
  Administered 2012-01-29: 4 mg via INTRAVENOUS
  Filled 2012-01-28: qty 2

## 2012-01-28 MED ORDER — OXYTOCIN BOLUS FROM INFUSION: 500.0000 mL | INTRAVENOUS | Status: DC

## 2012-01-28 MED ORDER — OXYCODONE-ACETAMINOPHEN 5-325 MG PO TABS: 1.0000 | ORAL_TABLET | ORAL | Status: DC | PRN

## 2012-01-28 MED ORDER — OXYTOCIN 40 UNITS IN LACTATED RINGERS INFUSION - SIMPLE MED
1.0000 m[IU]/min | INTRAVENOUS | Status: DC
  Administered 2012-01-28: 2 m[IU]/min via INTRAVENOUS
  Filled 2012-01-28: qty 1000

## 2012-01-28 MED ORDER — CITRIC ACID-SODIUM CITRATE 334-500 MG/5ML PO SOLN
30.0000 mL | ORAL | Status: DC | PRN
  Administered 2012-01-28 – 2012-01-30 (×2): 30 mL via ORAL
  Filled 2012-01-28 (×2): qty 15

## 2012-01-28 MED ORDER — LACTATED RINGERS IV SOLN
INTRAVENOUS | Status: DC
  Administered 2012-01-29: 11:00:00 via INTRAVENOUS

## 2012-01-28 MED ORDER — LACTATED RINGERS IV SOLN: 500.0000 mL | INTRAVENOUS | Status: DC | PRN

## 2012-01-28 MED ORDER — ACETAMINOPHEN 325 MG PO TABS
650.0000 mg | ORAL_TABLET | ORAL | Status: DC | PRN
  Administered 2012-01-28 – 2012-01-29 (×3): 650 mg via ORAL
  Filled 2012-01-28 (×3): qty 2

## 2012-01-28 MED ORDER — TERBUTALINE SULFATE 1 MG/ML IJ SOLN: 0.2500 mg | Freq: Once | INTRAMUSCULAR | Status: AC | PRN

## 2012-01-28 MED ORDER — FLEET ENEMA 7-19 GM/118ML RE ENEM: 1.0000 | ENEMA | RECTAL | Status: DC | PRN

## 2012-01-28 MED ORDER — HYDROXYZINE HCL 50 MG PO TABS: 50.0000 mg | ORAL_TABLET | Freq: Four times a day (QID) | ORAL | Status: DC | PRN

## 2012-01-28 MED ORDER — NALBUPHINE SYRINGE 5 MG/0.5 ML
5.0000 mg | INJECTION | INTRAMUSCULAR | Status: DC | PRN
  Administered 2012-01-29: 5 mg via INTRAVENOUS
  Filled 2012-01-28: qty 0.5

## 2012-01-28 MED ORDER — OXYTOCIN 40 UNITS IN LACTATED RINGERS INFUSION - SIMPLE MED: 62.5000 mL/h | INTRAVENOUS | Status: DC

## 2012-01-28 MED ORDER — LIDOCAINE HCL (PF) 1 % IJ SOLN
30.0000 mL | INTRAMUSCULAR | Status: DC | PRN
  Filled 2012-01-28: qty 30

## 2012-01-28 NOTE — H&P (Signed)
HPI: Stephanie Hopkins is a 26 y.o. year old G10P0010 female at [redacted]w[redacted]d weeks gestation by LMP 04/25/11 who was sent to [facility] for IOL for low AFI and A1 GDM. AFI was 5.38 cm. Denies LOF, VB. Good FM.   [facility] Genetic Screen  Reports negative  Anatomic [place]  Normal @ 20 wks; poor visual of spine>rescan nml  Glucose Screen 150 >>  3 hr GTT abnormal  GBS  Negative  Feeding Preference  Breast  Contraception   Circumcision    History OB History    Grav Para Term Preterm Abortions TAB SAB Ect Mult Living   2    1          Past Medical History  Diagnosis Date  . Supervision of normal pregnancy 09/14/2011    [name] (transfer from [facility]) Genetic Screen  Reports negative  Anatomic [place]  Normal @ 20 wks; poor visual of spine>rescan nml  Glucose Screen 150 >> needs 3 hr GTT  GBS   Feeding Preference  Breast  Contraception   Circumcision       . Gestational diabetes 12/07/2011    Diet controlled, excellent control.   Deliver at 40wks   . Colitis    Past Surgical History  Procedure Date  . 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 Social History:  reports that she has quit smoking. She does not have any smokeless tobacco history on file. She reports that she does not drink alcohol or use illicit drugs.   Prenatal Transfer Tool  Maternal Diabetes: Yes:  Diabetes Type:  Diet controlled Genetic Screening: Normal Maternal Ultrasounds/Referrals: Normal Fetal Ultrasounds or other Referrals:  None Maternal Substance Abuse:  No Significant Maternal Medications:  None Significant Maternal Lab Results:  None Other Comments:  Low AFI  Review of Systems  Constitutional: Negative for fever and chills.  Eyes: Negative for blurred vision and double vision.  Gastrointestinal: Negative for abdominal pain.  Neurological: Negative  for headaches.    Dilation: 1.5 Effacement (%): 50 Exam by:: Dorathy Kinsman CNM  Blood pressure 116/75, pulse 83, temperature 97.5 F (36.4 C), temperature source Oral, resp. rate 20, height 5\' 5"  (1.651 m), weight 95.709 kg (211 lb), last menstrual period 04/25/2011. Maternal Exam:  Uterine Assessment: Contraction strength is mild.  Contraction frequency is irregular.   Abdomen: Patient reports no abdominal tenderness. Estimated fetal weight is 8lb 7 oz.   Fetal presentation: vertex  Introitus: Normal vulva. Pelvis: adequate for delivery.   Cervix: Cervix evaluated by digital exam.     Fetal Exam Fetal Monitor Review: Mode: ultrasound.   Baseline rate: 130.  Variability: moderate (6-25 bpm).   Pattern: accelerations present and no decelerations.    Fetal State Assessment: Category I - tracings are normal.     Physical Exam  Nursing note and vitals reviewed. Constitutional: She is oriented to person, place, and time. She appears well-developed and well-nourished. No distress.  HENT:  Head: Normocephalic.  Eyes: Conjunctivae normal are normal. Pupils are equal, round, and reactive to light.  Cardiovascular: Normal rate and regular rhythm.   Respiratory: Effort normal and breath sounds normal.  GI: Soft.  Musculoskeletal: She exhibits no edema.  Neurological: She is alert and oriented to person, place, and time. She has normal reflexes.  Skin: Skin is warm.    Prenatal labs: ABO, Rh:  A/POS/-- (11/13 1655) Antibody: NEG (11/13 1655) Rubella: 7.4 (11/13 1655) RPR: NON REAC (11/13 1655)  HBsAg: NEGATIVE (11/13 1655)  HIV: NON REACTIVE (11/13 1655)  GBS: Negative (10/21 0000)  1 hour GTT 150, failed 3 hour. Genetic screening normal  Assessment: 1. Labor: IOL 2. Fetal Wellbeing: Category I  3. Pain Control: none 4. GBS: neg 5. 39.5 week IUP  Plan:  1. Admit to BS per consult with MD 2. Routine L&D orders 3. Analgesia/anesthesia PRN 4. Foley bulb then  pitocin  Macrina Lehnert 01/28/2012, 7:18 PM

## 2012-01-28 NOTE — Progress Notes (Signed)
S: Doing well, reports pain from a headache.  Desires pain medication for headache.  Not feeling contractions.     O: Filed Vitals:   01/28/12 1225 01/28/12 1230 01/28/12 1556  BP: 124/80  124/77  Pulse: 79  65  Temp: 98 F (36.7 C)  97.5 F (36.4 C)  TempSrc: Oral  Oral  Resp:   20  Height:  5\' 5"  (1.651 m)   Weight:  95.709 kg (211 lb)      FHT:  FHR: 125 bpm, variability: moderate,  accelerations:  Present,  decelerations:  Absent UC:   irregular SVE:   Dilation: 1.5 Effacement (%): 50 Exam by:: Dorathy Kinsman CNM  Foley bulb inserted without difficulty.   A / P: Induction of labor due to decreased AFI.  Fetal Wellbeing:  Category I Pain Control:  Tylenol for H/A  Anticipated MOD:  NSVD  Stephanie Hopkins, SNM 01/28/2012, 4:34 PM Supervised by: Dorathy Kinsman, CNM

## 2012-01-28 NOTE — Progress Notes (Signed)
CBG 104 

## 2012-01-28 NOTE — Progress Notes (Signed)
Foley bulb inserted per Carloyn Jaeger student CNM.  Regular diet received after insertion

## 2012-01-28 NOTE — Progress Notes (Signed)
S: Doing well with no complaints.     O: Filed Vitals:   01/28/12 1808 01/28/12 1933 01/28/12 2132 01/28/12 2300  BP: 116/75 121/83 132/77 112/73  Pulse: 83 74 83 95  Temp:  97.9 F (36.6 C) 97.4 F (36.3 C)   TempSrc:  Oral Oral   Resp: 20 16 18 20   Height:      Weight:         FHT:  FHR: 125 bpm, variability: moderate,  accelerations:  Present,  decelerations:  Absent UC:   regular, every 2-4 minutes SVE:   Dilation: 3.5 Effacement (%): 60 Station: -2 Exam by:: Arita Miss CNM student Foley bulb out @ 2019  A / P: Induction of labor due to low AFI S/P Foley bulb  Fetal Wellbeing:  Category I Pain Control:  none, plans IV pain medication and epidural prn Pitocin begin at 2 mU/min, increase by 2 mU/min  Anticipated MOD:  NSVD  Stephanie Hopkins, SNM 01/28/2012, 11:11 PM Supervised by: Sharen Counter, CNM

## 2012-01-28 NOTE — Telephone Encounter (Signed)
Spoke with L & D and induction moved up to today due to low AFI of <6.  Pt notified and is leaving for the MAU now.

## 2012-01-28 NOTE — Progress Notes (Signed)
I was present for the exam and agree with above.  [place], CNM 01/28/2012 7:05 PM

## 2012-01-29 ENCOUNTER — Encounter (HOSPITAL_COMMUNITY): Payer: Self-pay

## 2012-01-29 LAB — GLUCOSE, CAPILLARY
Glucose-Capillary: 86 mg/dL (ref 70–99)
Glucose-Capillary: 100 mg/dL — ABNORMAL HIGH (ref 70–99)

## 2012-01-29 MED ORDER — DIPHENHYDRAMINE HCL 50 MG/ML IJ SOLN: 12.5000 mg | INTRAMUSCULAR | Status: DC | PRN

## 2012-01-29 MED ORDER — LACTATED RINGERS IV SOLN
500.0000 mL | Freq: Once | INTRAVENOUS | Status: AC
  Administered 2012-01-29: 500 mL via INTRAVENOUS
  Administered 2012-01-30: 02:00:00 via INTRAVENOUS

## 2012-01-29 MED ORDER — EPHEDRINE 5 MG/ML INJ
10.0000 mg | INTRAVENOUS | Status: DC | PRN
  Filled 2012-01-29: qty 4

## 2012-01-29 MED ORDER — PHENYLEPHRINE 40 MCG/ML (10ML) SYRINGE FOR IV PUSH (FOR BLOOD PRESSURE SUPPORT): 80.0000 ug | PREFILLED_SYRINGE | INTRAVENOUS | Status: DC | PRN

## 2012-01-29 MED ORDER — LIDOCAINE HCL (PF) 1 % IJ SOLN
INTRAMUSCULAR | Status: DC | PRN
  Administered 2012-01-29 (×2): 5 mL

## 2012-01-29 MED ORDER — OXYTOCIN 40 UNITS IN LACTATED RINGERS INFUSION - SIMPLE MED
1.0000 m[IU]/min | INTRAVENOUS | Status: DC
  Administered 2012-01-29: 2 m[IU]/min via INTRAVENOUS

## 2012-01-29 MED ORDER — FENTANYL 2.5 MCG/ML BUPIVACAINE 1/10 % EPIDURAL INFUSION (WH - ANES)
14.0000 mL/h | INTRAMUSCULAR | Status: DC
  Administered 2012-01-29: 14 mL/h via EPIDURAL
  Filled 2012-01-29: qty 125

## 2012-01-29 MED ORDER — PHENYLEPHRINE 40 MCG/ML (10ML) SYRINGE FOR IV PUSH (FOR BLOOD PRESSURE SUPPORT)
80.0000 ug | PREFILLED_SYRINGE | INTRAVENOUS | Status: DC | PRN
  Filled 2012-01-29: qty 5

## 2012-01-29 MED ORDER — TERBUTALINE SULFATE 1 MG/ML IJ SOLN: 0.2500 mg | Freq: Once | INTRAMUSCULAR | Status: AC | PRN

## 2012-01-29 MED ORDER — EPHEDRINE 5 MG/ML INJ: 10.0000 mg | INTRAVENOUS | Status: DC | PRN

## 2012-01-29 NOTE — Progress Notes (Signed)
Stephanie Hopkins is a 26 y.o. G2P0010 at [redacted]w[redacted]d  Subjective: Patient feeling very little pain. Had short pitocin break to shower and eat a small meal, now ready to continue augmentation  Objective: BP 115/75  Pulse 86  Temp 98.1 F (36.7 C) (Oral)  Resp 18  Ht 5\' 5"  (1.651 m)  Wt 95.709 kg (211 lb)  BMI 35.11 kg/m2  LMP 04/25/2011      FHT:  FHR: 130 bpm, variability: moderate,  accelerations:  Present,  decelerations:  Absent UC:   irregular, every 3-6 minutes SVE:   Dilation: 5 Effacement (%): 70 Station: -2 Exam by:: Vieno Tarrant, MD Resident  Labs: Lab Results  Component Value Date   WBC 7.7 01/28/2012   HGB 12.3 01/28/2012   HCT 36.8 01/28/2012   MCV 85.2 01/28/2012   PLT 198 01/28/2012    Assessment / Plan: Augmentation of labor, progressing well  Labor: Will restart pitocin now. Continue to monitor Preeclampsia:  n/a Fetal Wellbeing:  Category I Pain Control:  Labor support without medications Anticipated MOD:  NSVD  Kodah Maret 01/29/2012, 11:46 AM

## 2012-01-29 NOTE — H&P (Signed)
Chart reviewed and agree with management and plan.  

## 2012-01-29 NOTE — Progress Notes (Signed)
Stephanie Hopkins is a 26 y.o. G2P0010 at [redacted]w[redacted]d   Subjective: Not feeling much pressure with contractions.  Objective: BP 115/77  Pulse 79  Temp 98.1 F (36.7 C) (Oral)  Resp 18  Ht 5\' 5"  (1.651 m)  Wt 95.709 kg (211 lb)  BMI 35.11 kg/m2  LMP 04/25/2011     FHT:  FHR: 120's bpm, variability: moderate,  accelerations:  Present,  decelerations:  Absent UC:   regular, every 2-4 minutes SVE:   Dilation: 5 Effacement (%): 70 Station: -2 Exam by:: Stephanie Zeis, MD Resident   Labs: Lab Results  Component Value Date   WBC 7.7 01/28/2012   HGB 12.3 01/28/2012   HCT 36.8 01/28/2012   MCV 85.2 01/28/2012   PLT 198 01/28/2012    Assessment / Plan: Induction of labor due to oligo,  progressing well on pitocin  Labor: Progressing on Pitocin, will continue to increase then AROM. Discussed AROM with patient at this SVE, and she would like to wait. Encouraged OOB and/or birthing ball. Preeclampsia:  n/a Fetal Wellbeing:  Category I Pain Control:  Labor support without medications Anticipated MOD:  NSVD  Stephanie Hopkins 01/29/2012, 2:13 PM

## 2012-01-29 NOTE — Anesthesia Preprocedure Evaluation (Signed)
Anesthesia Evaluation  Patient identified by MRN, date of birth, ID band Patient awake    Reviewed: Allergy & Precautions, H&P , Patient's Chart, lab work & pertinent test results  Airway Mallampati: III TM Distance: >3 FB Neck ROM: full    Dental No notable dental hx.    Pulmonary neg pulmonary ROS,  breath sounds clear to auscultation  Pulmonary exam normal       Cardiovascular negative cardio ROS  Rhythm:regular Rate:Normal     Neuro/Psych negative neurological ROS  negative psych ROS   GI/Hepatic negative GI ROS, Neg liver ROS,   Endo/Other  negative endocrine ROSdiabetesMorbid obesity  Renal/GU negative Renal ROS     Musculoskeletal   Abdominal   Peds  Hematology negative hematology ROS (+)   Anesthesia Other Findings   Reproductive/Obstetrics (+) Pregnancy                           Anesthesia Physical Anesthesia Plan  ASA: III  Anesthesia Plan: Epidural   Post-op Pain Management:    Induction:   Airway Management Planned:   Additional Equipment:   Intra-op Plan:   Post-operative Plan:   Informed Consent: I have reviewed the patients History and Physical, chart, labs and discussed the procedure including the risks, benefits and alternatives for the proposed anesthesia with the patient or authorized representative who has indicated his/her understanding and acceptance.     Plan Discussed with:   Anesthesia Plan Comments:         Anesthesia Quick Evaluation

## 2012-01-29 NOTE — Progress Notes (Signed)
Patient ID: Stephanie Hopkins, female   DOB: December 14, 1985, 26 y.o.   MRN: [id number]  S:  Pt rates her contraction pain a 4 to 5. Would like to have membranes ruptured.  O:   Filed Vitals:   01/29/12 1825 01/29/12 1829 01/29/12 1830 01/29/12 1833  BP: 138/73 129/88  130/84  Pulse: 95 79 74 70  Temp:    97.6 F (36.4 C)  TempSrc:      Resp:    20  Height:      Weight:      SpO2: 100%  99%     Dilation: 5 Effacement (%): 50 Cervical Position: mid Station: -2 Presentation: Vertex Exam by:: Thad Ranger, MD  FHTs: 125, mod var, accels present, no decels TOCO:  q 2-3 min Pitocin at 20  A/P Induction of labor for A1GDM, oligo - on pitocin - no change in dilation despite frequent contractions - AROM, clear - IUPC placed - FHTs cat I - Continue to increase pitocin  Napoleon Form, MD

## 2012-01-29 NOTE — Anesthesia Procedure Notes (Signed)
Epidural Patient location during procedure: OB Start time: 01/29/2012 6:26 PM  Staffing Anesthesiologist: Brayton Caves R Performed by: anesthesiologist   Preanesthetic Checklist Completed: patient identified, site marked, surgical consent, pre-op evaluation, timeout performed, IV checked, risks and benefits discussed and monitors and equipment checked  Epidural Patient position: sitting Prep: site prepped and draped and DuraPrep Patient monitoring: continuous pulse ox and blood pressure Approach: midline Injection technique: LOR air and LOR saline  Needle:  Needle type: Tuohy  Needle gauge: 17 G Needle length: 9 cm and 9 Needle insertion depth: 5 cm cm Catheter type: closed end flexible Catheter size: 19 Gauge Catheter at skin depth: 10 cm Test dose: negative  Assessment Events: blood not aspirated, injection not painful, no injection resistance, negative IV test and no paresthesia  Additional Notes Patient identified.  Risk benefits discussed including failed block, incomplete pain control, headache, nerve damage, paralysis, blood pressure changes, nausea, vomiting, reactions to medication both toxic or allergic, and postpartum back pain.  Patient expressed understanding and wished to proceed.  All questions were answered.  Sterile technique used throughout procedure and epidural site dressed with sterile barrier dressing. No paresthesia or other complications noted.The patient did not experience any signs of intravascular injection such as tinnitus or metallic taste in mouth nor signs of intrathecal spread such as rapid motor block. Please see nursing notes for vital signs.

## 2012-01-29 NOTE — OB Triage Provider Note (Signed)
S: Doing well, pain is tolerable but may need something by IV.  Planning epidural later.   OCeasar Mons Vitals:   01/29/12 0103 01/29/12 0132 01/29/12 0203 01/29/12 0232  BP: 138/75 112/79 126/81 132/85  Pulse: 77 77 72 82  Temp:   97.9 F (36.6 C)   TempSrc:   Oral   Resp: 18 20 16 16   Height:      Weight:         FHT:  FHR: 120 bpm, variability: moderate,  accelerations:  Present,  decelerations:  Absent UC:   regular, every 2-3 minutes SVE:   Dilation: 3.5 Effacement (%): 60 Station: -2 Exam by:: Arita Miss CNM Student   A / P: Induction of labor due to low AFI,  progressing well on pitocin Gestational Diabetic, A1 well-controlled  Fetal Wellbeing:  Category I Pain Control:  Nubain 5mg  IVP  Anticipated MOD:  NSVD  Margret Chance 01/29/2012, 1:56 AM Supervised  By: Sharen Counter, CNM

## 2012-01-29 NOTE — Progress Notes (Signed)
Patient ID: Stephanie Hopkins, female   DOB: [birth year], 26 y.o.   MRN: [id number]   S:  Pt blocked and comfortable. Feeling some vaginal pressure.  O: Filed Vitals:   01/29/12 1857 01/29/12 1902 01/29/12 1933 01/29/12 2003  BP: 117/71 109/74 118/61 123/67  Pulse: 72 68 55 56  Temp:      TempSrc:      Resp:  18 16 16   Height:      Weight:      SpO2:       Dilation: 6 Effacement (%): 90 Cervical Position: Anterior Station: -1 Presentation: Vertex Exam by:: Thad Ranger MD  FHTs:  120, moderate variability, accels present, no decels MVUs:  180-200s Pitocin at 20  A/P Progressing well on pitocin, contractions adequate, Cat I strip Continue current management.  Napoleon Form, MD

## 2012-01-30 ENCOUNTER — Inpatient Hospital Stay (HOSPITAL_COMMUNITY): Payer: Medicaid Other | Admitting: Anesthesiology

## 2012-01-30 ENCOUNTER — Inpatient Hospital Stay (HOSPITAL_COMMUNITY): Admission: RE | Admit: 2012-01-30 | Payer: Medicaid Other | Source: Ambulatory Visit

## 2012-01-30 ENCOUNTER — Encounter (HOSPITAL_COMMUNITY): Payer: Self-pay | Admitting: Anesthesiology

## 2012-01-30 ENCOUNTER — Encounter (HOSPITAL_COMMUNITY)
Admission: AD | Disposition: A | Discharge status: Home / Self Care | Payer: Self-pay | Source: Ambulatory Visit | Attending: Family Medicine

## 2012-01-30 ENCOUNTER — Encounter (HOSPITAL_COMMUNITY): Payer: Self-pay | Admitting: Family Medicine

## 2012-01-30 DIAGNOSIS — O4100X0 Oligohydramnios, unspecified trimester, not applicable or unspecified: Secondary | ICD-10-CM

## 2012-01-30 DIAGNOSIS — O324XX0 Maternal care for high head at term, not applicable or unspecified: Secondary | ICD-10-CM

## 2012-01-30 DIAGNOSIS — O24419 Gestational diabetes mellitus in pregnancy, unspecified control: Secondary | ICD-10-CM

## 2012-01-30 SURGERY — Surgical Case
Anesthesia: Epidural | Site: Abdomen | Wound class: Clean Contaminated

## 2012-01-30 MED ORDER — CEFAZOLIN SODIUM 1-5 GM-% IV SOLN
INTRAVENOUS | Status: DC | PRN
  Administered 2012-01-30: 2 g via INTRAVENOUS

## 2012-01-30 MED ORDER — SODIUM CHLORIDE 0.9 % IJ SOLN: 3.0000 mL | INTRAMUSCULAR | Status: DC | PRN

## 2012-01-30 MED ORDER — ONDANSETRON HCL 4 MG/2ML IJ SOLN
INTRAMUSCULAR | Status: AC
  Filled 2012-01-30: qty 2

## 2012-01-30 MED ORDER — NALBUPHINE SYRINGE 5 MG/0.5 ML
5.0000 mg | INJECTION | INTRAMUSCULAR | Status: DC | PRN
  Filled 2012-01-30: qty 1

## 2012-01-30 MED ORDER — KETOROLAC TROMETHAMINE 60 MG/2ML IM SOLN
60.0000 mg | Freq: Once | INTRAMUSCULAR | Status: AC | PRN
  Administered 2012-01-30: 60 mg via INTRAMUSCULAR

## 2012-01-30 MED ORDER — MEPERIDINE HCL 25 MG/ML IJ SOLN
INTRAMUSCULAR | Status: AC
  Filled 2012-01-30: qty 1

## 2012-01-30 MED ORDER — WITCH HAZEL-GLYCERIN EX PADS: 1.0000 "application " | MEDICATED_PAD | CUTANEOUS | Status: DC | PRN

## 2012-01-30 MED ORDER — CEFAZOLIN SODIUM-DEXTROSE 2-3 GM-% IV SOLR
INTRAVENOUS | Status: AC
  Filled 2012-01-30: qty 50

## 2012-01-30 MED ORDER — FENTANYL CITRATE 0.05 MG/ML IJ SOLN
INTRAMUSCULAR | Status: DC | PRN
  Administered 2012-01-30: 50 ug via INTRAVENOUS

## 2012-01-30 MED ORDER — KETOROLAC TROMETHAMINE 30 MG/ML IJ SOLN: 30.0000 mg | Freq: Four times a day (QID) | INTRAMUSCULAR | Status: AC | PRN

## 2012-01-30 MED ORDER — ONDANSETRON HCL 4 MG/2ML IJ SOLN
INTRAMUSCULAR | Status: DC | PRN
  Administered 2012-01-30: 4 mg via INTRAVENOUS

## 2012-01-30 MED ORDER — NALOXONE HCL 0.4 MG/ML IJ SOLN: 0.4000 mg | INTRAMUSCULAR | Status: DC | PRN

## 2012-01-30 MED ORDER — DIPHENHYDRAMINE HCL 50 MG/ML IJ SOLN: 25.0000 mg | INTRAMUSCULAR | Status: DC | PRN

## 2012-01-30 MED ORDER — KETOROLAC TROMETHAMINE 60 MG/2ML IM SOLN
INTRAMUSCULAR | Status: AC
  Administered 2012-01-30: 60 mg via INTRAMUSCULAR
  Filled 2012-01-30: qty 2

## 2012-01-30 MED ORDER — 0.9 % SODIUM CHLORIDE (POUR BTL) OPTIME
TOPICAL | Status: DC | PRN
  Administered 2012-01-30: 1000 mL

## 2012-01-30 MED ORDER — DIPHENHYDRAMINE HCL 25 MG PO CAPS: 25.0000 mg | ORAL_CAPSULE | ORAL | Status: DC | PRN

## 2012-01-30 MED ORDER — MORPHINE SULFATE 0.5 MG/ML IJ SOLN
INTRAMUSCULAR | Status: AC
  Filled 2012-01-30: qty 10

## 2012-01-30 MED ORDER — DIPHENHYDRAMINE HCL 50 MG/ML IJ SOLN: 12.5000 mg | INTRAMUSCULAR | Status: DC | PRN

## 2012-01-30 MED ORDER — SIMETHICONE 80 MG PO CHEW: 80.0000 mg | CHEWABLE_TABLET | ORAL | Status: DC | PRN

## 2012-01-30 MED ORDER — CEFAZOLIN SODIUM-DEXTROSE 2-3 GM-% IV SOLR: 2.0000 g | INTRAVENOUS | Status: DC

## 2012-01-30 MED ORDER — SIMETHICONE 80 MG PO CHEW
80.0000 mg | CHEWABLE_TABLET | Freq: Three times a day (TID) | ORAL | Status: DC
  Administered 2012-01-30 – 2012-02-02 (×12): 80 mg via ORAL

## 2012-01-30 MED ORDER — OXYTOCIN 10 UNIT/ML IJ SOLN
INTRAMUSCULAR | Status: AC
  Filled 2012-01-30: qty 4

## 2012-01-30 MED ORDER — SCOPOLAMINE 1 MG/3DAYS TD PT72
MEDICATED_PATCH | TRANSDERMAL | Status: AC
  Filled 2012-01-30: qty 1

## 2012-01-30 MED ORDER — ZOLPIDEM TARTRATE 5 MG PO TABS: 5.0000 mg | ORAL_TABLET | Freq: Every evening | ORAL | Status: DC | PRN

## 2012-01-30 MED ORDER — PRENATAL MULTIVITAMIN CH
1.0000 | ORAL_TABLET | Freq: Every day | ORAL | Status: DC
  Administered 2012-01-30 – 2012-02-02 (×4): 1 via ORAL
  Filled 2012-01-30 (×4): qty 1

## 2012-01-30 MED ORDER — SCOPOLAMINE 1 MG/3DAYS TD PT72: 1.0000 | MEDICATED_PATCH | Freq: Once | TRANSDERMAL | Status: DC

## 2012-01-30 MED ORDER — DIPHENHYDRAMINE HCL 25 MG PO CAPS
25.0000 mg | ORAL_CAPSULE | Freq: Four times a day (QID) | ORAL | Status: DC | PRN
  Filled 2012-01-30: qty 1

## 2012-01-30 MED ORDER — OXYCODONE-ACETAMINOPHEN 5-325 MG PO TABS
1.0000 | ORAL_TABLET | ORAL | Status: DC | PRN
  Administered 2012-01-30 – 2012-01-31 (×3): 1 via ORAL
  Administered 2012-01-31: 2 via ORAL
  Administered 2012-01-31 (×5): 1 via ORAL
  Administered 2012-02-01 (×3): 2 via ORAL
  Administered 2012-02-01: 1 via ORAL
  Administered 2012-02-01 – 2012-02-02 (×2): 2 via ORAL
  Administered 2012-02-02: 1 via ORAL
  Filled 2012-01-30: qty 2
  Filled 2012-01-30 (×2): qty 1
  Filled 2012-01-30: qty 2
  Filled 2012-01-30 (×3): qty 1
  Filled 2012-01-30: qty 2
  Filled 2012-01-30: qty 1
  Filled 2012-01-30 (×2): qty 2
  Filled 2012-01-30 (×5): qty 1
  Filled 2012-01-30: qty 2

## 2012-01-30 MED ORDER — MORPHINE SULFATE (PF) 0.5 MG/ML IJ SOLN
INTRAMUSCULAR | Status: DC | PRN
  Administered 2012-01-30: 2000 ug via INTRAVENOUS
  Administered 2012-01-30: 3000 ug via INTRAVENOUS

## 2012-01-30 MED ORDER — MEPERIDINE HCL 25 MG/ML IJ SOLN: 6.2500 mg | INTRAMUSCULAR | Status: DC | PRN

## 2012-01-30 MED ORDER — METOCLOPRAMIDE HCL 5 MG/ML IJ SOLN: 10.0000 mg | Freq: Three times a day (TID) | INTRAMUSCULAR | Status: DC | PRN

## 2012-01-30 MED ORDER — SODIUM BICARBONATE 8.4 % IV SOLN
INTRAVENOUS | Status: DC | PRN
  Administered 2012-01-30 (×2): 5 mL via EPIDURAL

## 2012-01-30 MED ORDER — ONDANSETRON HCL 4 MG/2ML IJ SOLN: 4.0000 mg | INTRAMUSCULAR | Status: DC | PRN

## 2012-01-30 MED ORDER — NALOXONE HCL 1 MG/ML IJ SOLN
1.0000 ug/kg/h | INTRAMUSCULAR | Status: DC | PRN
  Filled 2012-01-30: qty 2

## 2012-01-30 MED ORDER — OXYTOCIN 40 UNITS IN LACTATED RINGERS INFUSION - SIMPLE MED: 62.5000 mL/h | INTRAVENOUS | Status: AC

## 2012-01-30 MED ORDER — FENTANYL CITRATE 0.05 MG/ML IJ SOLN
INTRAMUSCULAR | Status: AC
  Filled 2012-01-30: qty 2

## 2012-01-30 MED ORDER — OXYTOCIN 10 UNIT/ML IJ SOLN
40.0000 [IU] | INTRAVENOUS | Status: DC | PRN
  Administered 2012-01-30: 40 [IU] via INTRAVENOUS

## 2012-01-30 MED ORDER — MENTHOL 3 MG MT LOZG: 1.0000 | LOZENGE | OROMUCOSAL | Status: DC | PRN

## 2012-01-30 MED ORDER — IBUPROFEN 600 MG PO TABS
600.0000 mg | ORAL_TABLET | Freq: Four times a day (QID) | ORAL | Status: DC
  Administered 2012-01-30 – 2012-02-02 (×12): 600 mg via ORAL
  Filled 2012-01-30 (×12): qty 1

## 2012-01-30 MED ORDER — LANOLIN HYDROUS EX OINT: 1.0000 "application " | TOPICAL_OINTMENT | CUTANEOUS | Status: DC | PRN

## 2012-01-30 MED ORDER — SENNOSIDES-DOCUSATE SODIUM 8.6-50 MG PO TABS
2.0000 | ORAL_TABLET | Freq: Every day | ORAL | Status: DC
  Administered 2012-01-30 – 2012-02-01 (×3): 2 via ORAL

## 2012-01-30 MED ORDER — IBUPROFEN 600 MG PO TABS: 600.0000 mg | ORAL_TABLET | Freq: Four times a day (QID) | ORAL | Status: DC | PRN

## 2012-01-30 MED ORDER — ONDANSETRON HCL 4 MG PO TABS
4.0000 mg | ORAL_TABLET | ORAL | Status: DC | PRN
  Administered 2012-01-30 (×2): 4 mg via ORAL
  Filled 2012-01-30 (×2): qty 1

## 2012-01-30 MED ORDER — CHLOROPROCAINE HCL 3 % IJ SOLN
INTRAMUSCULAR | Status: DC | PRN
  Administered 2012-01-30: 600 mg

## 2012-01-30 MED ORDER — LACTATED RINGERS IV SOLN: INTRAVENOUS | Status: DC

## 2012-01-30 MED ORDER — CHLOROPROCAINE HCL 3 % IJ SOLN
INTRAMUSCULAR | Status: AC
  Filled 2012-01-30: qty 20

## 2012-01-30 MED ORDER — MEPERIDINE HCL 25 MG/ML IJ SOLN
INTRAMUSCULAR | Status: DC | PRN
  Administered 2012-01-30: 12.5 mg via INTRAVENOUS

## 2012-01-30 MED ORDER — TETANUS-DIPHTH-ACELL PERTUSSIS 5-2.5-18.5 LF-MCG/0.5 IM SUSP: 0.5000 mL | Freq: Once | INTRAMUSCULAR | Status: DC

## 2012-01-30 MED ORDER — DIBUCAINE 1 % RE OINT: 1.0000 "application " | TOPICAL_OINTMENT | RECTAL | Status: DC | PRN

## 2012-01-30 MED ORDER — ONDANSETRON HCL 4 MG/2ML IJ SOLN: 4.0000 mg | Freq: Three times a day (TID) | INTRAMUSCULAR | Status: DC | PRN

## 2012-01-30 SURGICAL SUPPLY — 34 items
BENZOIN TINCTURE PRP APPL 2/3 (GAUZE/BANDAGES/DRESSINGS) ×4 IMPLANT
CLOTH BEACON ORANGE TIMEOUT ST (SAFETY) ×2 IMPLANT
DRAPE SURG 17X23 STRL (DRAPES) IMPLANT
DRSG COVADERM 4X10 (GAUZE/BANDAGES/DRESSINGS) ×2 IMPLANT
DURAPREP 26ML APPLICATOR (WOUND CARE) ×2 IMPLANT
ELECT REM PT RETURN 9FT ADLT (ELECTROSURGICAL) ×2
ELECTRODE REM PT RTRN 9FT ADLT (ELECTROSURGICAL) ×1 IMPLANT
EXTRACTOR VACUUM KIWI (MISCELLANEOUS) IMPLANT
GLOVE BIO SURGEON ST LM GN SZ9 (GLOVE) ×2 IMPLANT
GLOVE BIOGEL PI IND STRL 9 (GLOVE) ×2 IMPLANT
GLOVE BIOGEL PI INDICATOR 9 (GLOVE) ×2
GOWN PREVENTION PLUS LG XLONG (DISPOSABLE) ×2 IMPLANT
GOWN PREVENTION PLUS XLARGE (GOWN DISPOSABLE) ×2 IMPLANT
GOWN STRL REIN 3XL LVL4 (GOWN DISPOSABLE) ×2 IMPLANT
NEEDLE HYPO 25X5/8 SAFETYGLIDE (NEEDLE) ×2 IMPLANT
NS IRRIG 1000ML POUR BTL (IV SOLUTION) ×2 IMPLANT
PACK C SECTION WH (CUSTOM PROCEDURE TRAY) ×2 IMPLANT
PAD ABD 7.5X8 STRL (GAUZE/BANDAGES/DRESSINGS) ×2 IMPLANT
PAD OB MATERNITY 4.3X12.25 (PERSONAL CARE ITEMS) ×2 IMPLANT
RETRACTOR WND ALEXIS 25 LRG (MISCELLANEOUS) IMPLANT
RTRCTR C-SECT PINK 25CM LRG (MISCELLANEOUS) ×2 IMPLANT
RTRCTR WOUND ALEXIS 25CM LRG (MISCELLANEOUS)
SLEEVE SCD COMPRESS KNEE MED (MISCELLANEOUS) ×2 IMPLANT
SPONGE LAP 18X18 X RAY DECT (DISPOSABLE) ×2 IMPLANT
STRIP CLOSURE SKIN 1/2X4 (GAUZE/BANDAGES/DRESSINGS) ×4 IMPLANT
SUT CHROMIC 0 CTX 36 (SUTURE) ×6 IMPLANT
SUT VIC AB 0 CT1 27 (SUTURE) ×1
SUT VIC AB 0 CT1 27XBRD ANBCTR (SUTURE) ×1 IMPLANT
SUT VIC AB 2-0 CT1 27 (SUTURE) ×2
SUT VIC AB 2-0 CT1 TAPERPNT 27 (SUTURE) ×2 IMPLANT
SUT VIC AB 4-0 KS 27 (SUTURE) ×2 IMPLANT
SYR BULB IRRIGATION 50ML (SYRINGE) IMPLANT
TRAY FOLEY CATH 14FR (SET/KITS/TRAYS/PACK) IMPLANT
WATER STERILE IRR 1000ML POUR (IV SOLUTION) IMPLANT

## 2012-01-30 NOTE — Op Note (Signed)
Stephanie Hopkins  PROCEDURE DATE: 01/30/2012  PREOPERATIVE DIAGNOSIS: Intrauterine pregnancy at  40w 0d gestation; failure to progress: arrest of descent  POSTOPERATIVE DIAGNOSIS: The same  PROCEDURE:     Primary Low Transverse Cesarean Section  SURGEON:  Dr. Christin Bach  ASSISTANT:  Dr. Napoleon Form  INDICATIONS: Stephanie Hopkins is a 26 y.o. G2P0010 at 40w 0d who presented for induction of labor for oligohydramnios and gestational diabetes. After cervical ripening with cytotec, the patient was started on pitocin. She progressed to complete dilation and +2 station and pushed for 3 hours. Vacuum assisted vaginal delivery was attempted with pulling through 3 contractions with one pop-off without significant progress. The patient agreed to proceed to cesarean delivery secondary to failure to progress: arrest of descent.  The risks of cesarean section discussed with the patient included but were not limited to: bleeding which may require transfusion or reoperation; infection which may require antibiotics; injury to bowel, bladder, ureters or other surrounding organs; injury to the fetus; need for additional procedures including hysterectomy in the event of a life-threatening hemorrhage; placental abnormalities wth subsequent pregnancies, incisional problems, thromboembolic phenomenon and other postoperative/anesthesia complications. The patient concurred with the proposed plan, giving informed written consent for the procedure.    FINDINGS:  Viable female infant in cephalic presentation.  Apgars 4 and 9, weight, 8 pounds and 4.4 ounces, arterial cord pH 7.27.  Clear amniotic fluid.  Intact placenta, three vessel cord.  Normal uterus, fallopian tubes and ovaries bilaterally.  ANESTHESIA:    Epidural INTRAVENOUS FLUIDS:  1700 ml ESTIMATED BLOOD LOSS: 500 ml URINE OUTPUT:  450 ml SPECIMENS: Placenta sent to L&D, Cord blood donation COMPLICATIONS: None immediate  PROCEDURE IN DETAIL:  The patient had  sequential compression devices applied to her lower extremities and a foley catheter placed into her bladder. She was then taken to the operating room where her epidural anesthesia was rebolused and was found to be adequate.  Antibiotics were administered. She was then placed in a dorsal supine position with a leftward tilt, and prepped and draped in a sterile manner.  After an adequate timeout was performed, a Pfannenstiel skin incision was made with scalpel and carried through to the underlying layer of fascia. The fascia was incised in the midline and this incision was extended bilaterally using the Mayo scissors. Kocher clamps were applied to the superior aspect of the fascial incision and the underlying rectus muscles were dissected off bluntly and sharply. A similar process was carried out on the inferior aspect of the facial incision. The rectus muscles were separated in the midline bluntly and the peritoneum was entered sharply and extended superiorly and inferiorly with Metzenbaum scissors. The Alexis self-retaining retractor was introduced into the abdominal cavity. Attention was turned to the lower uterine segment where a ransverse hysterotomy was made with a scalpel and extended bilaterally bluntly. The infant was successfully delivered with extension of the hysterotomy incision on the right side. The cord was clamped and cut and infant was handed over to awaiting neonatology team. Cord blood was collected for donation. Uterine massage was then administered and the placenta delivered intact with three-vessel cord. The uterus was cleared of clot and debris.  The hysterotomy was closed with 0 Chromic in a running locked fashion, and an imbricating layer was also placed with a 0 Chromic. The bladder flap was closed with 2.0 Vicryl. Overall, excellent hemostasis was noted. The pelvis copiously irrigated and cleared of all clot and debris. Hemostasis was confirmed on all surfaces.  The peritoneum was  reapproximated using 2.0 vicryl in running stitch. The fascia was then closed using 0 Vicryl in a running fashion.  The subcutaneous layer was reapproximated with plain gut interrupted suture, and the skin was closed in a subcuticular fashion using 4.0 Vicryl. The patient tolerated the procedure well. Sponge, lap, instrument and needle counts were correct x 2. She was taken to the recovery room in stable condition.   Napoleon Form  01/30/2012 3:36 AM

## 2012-01-30 NOTE — Transfer of Care (Signed)
Immediate Anesthesia Transfer of Care Note  Patient: Stephanie Hopkins  Procedure(s) Performed: Procedure(s) (LRB) with comments: CESAREAN SECTION (N/A) - Primary Cesarean Section Delivery Baby Boy @ [phone], Apgars 4/9   Patient Location: PACU  Anesthesia Type:Spinal  Level of Consciousness: awake, alert  and oriented  Airway & Oxygen Therapy: Patient Spontanous Breathing  Post-op Assessment: Report given to PACU RN and Post -op Vital signs reviewed and stable  Post vital signs: Reviewed and stable  Complications: No apparent anesthesia complications

## 2012-01-30 NOTE — Consult Note (Signed)
Neonatology Note:   Attendance at C-section:    I was asked to attend this primary C/S at term due to CPD, failed vacuum delivery. The mother is a G1P0 A pos, GBS neg with diet-controlled GDM. ROM 9 hours prior to delivery, fluid clear. Infant was floppy at birth and he grimaced as if to cry, but then became apneic. We bulb suctioned and gave vigorous stimulation without improvement, so PPV was applied for about 1 min. HR was about 40 and rose with PPV; once it was above 100, PPV was stopped and stimulation was done again, bulb suctioned for some clear secretions. Baby did not breathe, so PPV reapplied for another minute. When HR was above 100 again, we stopped PPV and gave stimulation, this time with baby starting to cry (about 4 min). Color pink, perfusion good. Tone was normal by 5 min of life. Ap 4/9. Lungs with a few rales in DR. Allowed to stay for skin to skin time, but advised to check blood glucose at 30-60 min due to history of GDM. To CN to care of Pediatrician.   Deatra James, MD

## 2012-01-30 NOTE — Progress Notes (Signed)
Patient ID: Stephanie Hopkins, female   DOB: 1985/08/31, 26 y.o.   MRN: [id number]  After reaching complete dilation/+2 station, pt pushed for 3 hours without further progress of descent.   The comparative risks and benefits of vacuum assisted vaginal delivery and cesarean section were discussed, including, for vacuum delivery. Overall concern for a large for gestational age baby and possible shoulder dystocia were also discussed.   The Patient desired attempt of vacuum delivery. Risks were explained, including perineal pain, lacerations, hematomas, blood loss and anemia, urinary retention, and long-term problems with urinary and fecal incontinence; fetal  scalp lacerations, cephalohematomas, subgaleal hematomas, intracranial hemorrhage, facial nerve palsies, hyperbilirubinemia, and retinal hemorrhage.   The patient's bladder was emptied. Vacuum cup was positioned over the sagittal suture 3 cm anterior to posterior fontanelle. Pressure was then increased to 500 mm Hg and patient was instructed to push. Pulling was administered along the pelvic curve during pushing through 3 contractions with 1 pop-off. Minimal progress was made. At this time, after further discussion with Dr. Emelda Fear, patient and spouse decided to proceed with cesarean section. Fetal heart tones remained reassuring throughout.  Napoleon Form, MD

## 2012-01-30 NOTE — Anesthesia Postprocedure Evaluation (Signed)
  Anesthesia Post-op Note  Patient: Stephanie Hopkins  Procedure(s) Performed: Procedure(s) (LRB) with comments: CESAREAN SECTION (N/A) - Primary Cesarean Section Delivery Baby Boy @ [phone], Apgars 4/9   Patient Location: Mother/Baby  Anesthesia Type:Epidural  Level of Consciousness: awake, alert  and oriented  Airway and Oxygen Therapy: Patient Spontanous Breathing  Post-op Pain: mild  Post-op Assessment: Patient's Cardiovascular Status Stable, Respiratory Function Stable, Patent Airway, No signs of Nausea or vomiting and Pain level controlled  Post-op Vital Signs: stable  Complications: No apparent anesthesia complications

## 2012-01-30 NOTE — Progress Notes (Signed)
MD at the bedside to discuss the risks and benefits of vacuum assisted and caesarean section deliveries. Pt states understanding.

## 2012-01-31 LAB — CBC
HCT: 30 % — ABNORMAL LOW (ref 36.0–46.0)
MCH: 28.4 pg (ref 26.0–34.0)
MCV: 87 fL (ref 78.0–100.0)
RBC: 3.45 MIL/uL — ABNORMAL LOW (ref 3.87–5.11)
WBC: 11.6 10*3/uL — ABNORMAL HIGH (ref 4.0–10.5)

## 2012-01-31 MED ORDER — FERROUS SULFATE 325 (65 FE) MG PO TABS
325.0000 mg | ORAL_TABLET | Freq: Every day | ORAL | Status: DC
  Administered 2012-02-01 – 2012-02-02 (×2): 325 mg via ORAL
  Filled 2012-01-31 (×2): qty 1

## 2012-01-31 NOTE — Progress Notes (Signed)
Subjective: Postpartum Day 1: Cesarean Delivery Patient reports incisional pain, tolerating PO, + flatus and no problems voiding.   Breastfeeding, states very little colostrum yet. Baby latching on well.  Objective: Vital signs in last 24 hours: Temp:  [97.7 F (36.5 C)-98.5 F (36.9 C)] 97.7 F (36.5 C) (11/17 0550) Pulse Rate:  [62-79] 62  (11/17 0550) Resp:  [18-24] 18  (11/17 0550) BP: (104-124)/(55-79) 107/59 mmHg (11/17 0550) SpO2:  [96 %-99 %] 96 % (11/17 0130)  Physical Exam:  General: alert, cooperative and no distress Lochia: appropriate Uterine Fundus: firm Incision: no significant drainage, dressing still in place DVT Evaluation: No evidence of DVT seen on physical exam. Negative Homan's sign. No cords or calf tenderness.   Basename 01/31/12 0541 01/28/12 1225  HGB 9.8* 12.3  HCT 30.0* 36.8    Assessment/Plan: Status post Cesarean section. Doing well postoperatively.  Continue current care. Will have patient remove bandage in shower today Home tomorrow or Tuesday Lactation consult, patient to bring breast pump from home.  Napoleon Form 01/31/2012, 10:45 AM

## 2012-01-31 NOTE — Progress Notes (Signed)
I have seen this patient and agree with the above midwife student's note.  LEFTWICH-KIRBY, Grabiela Wohlford Certified Nurse-Midwife 

## 2012-01-31 NOTE — Progress Notes (Signed)
I was present and involved in both discussion  Informed consent, and vacuum efforts, and agree with process and above note.

## 2012-01-31 NOTE — OB Triage Provider Note (Signed)
I have seen this patient and agree with the above midwife student's note.  LEFTWICH-KIRBY, Gorgeous [place] Certified Nurse-Midwife 

## 2012-02-01 ENCOUNTER — Encounter (HOSPITAL_COMMUNITY): Payer: Self-pay | Admitting: Obstetrics and Gynecology

## 2012-02-01 LAB — GLUCOSE, CAPILLARY: Glucose-Capillary: 99 mg/dL (ref 70–99)

## 2012-02-01 NOTE — Progress Notes (Signed)
Post Partum Day  Subjective: no complaints, up ad lib, voiding, tolerating PO and + flatus  Objective: Blood pressure 104/62, pulse 81, temperature 97.3 F (36.3 C), temperature source Oral, resp. rate 18, height 5\' 5"  (1.651 m), weight 95.709 kg (211 lb), last menstrual period 04/25/2011, SpO2 96.00%  Physical Exam:  General: alert, cooperative and no distress Lochia: appropriate Uterine Fundus: firm Incision: healing well DVT Evaluation: No evidence of DVT seen on physical exam.  Basename 01/31/12 0541  HGB 9.8*  HCT 30.0*   Assessment/Plan: Breastfeeding Plan for discharge tomorrow and Contraception natural birth planning. Plan for outpatient circumcision.   LOS: 4 days   Hopkins, Stephanie 02/01/2012, 9:04 AM

## 2012-02-01 NOTE — Progress Notes (Signed)
Seen and agree Graceann Boileau CNM 

## 2012-02-01 NOTE — Progress Notes (Signed)
Ur chart review completed.  

## 2012-02-02 MED ORDER — OXYCODONE-ACETAMINOPHEN 5-325 MG PO TABS: 1.0000 | ORAL_TABLET | ORAL | Status: DC | PRN

## 2012-02-02 MED ORDER — INFLUENZA VIRUS VACC SPLIT PF IM SUSP
0.5000 mL | Freq: Once | INTRAMUSCULAR | Status: AC
  Administered 2012-02-02: 0.5 mL via INTRAMUSCULAR

## 2012-02-02 MED ORDER — HYDROCORTISONE 1 % EX CREA
TOPICAL_CREAM | Freq: Four times a day (QID) | CUTANEOUS | Status: DC
  Filled 2012-02-02: qty 28

## 2012-02-02 MED ORDER — MEASLES, MUMPS & RUBELLA VAC ~~LOC~~ INJ
0.5000 mL | INJECTION | Freq: Once | SUBCUTANEOUS | Status: AC
  Administered 2012-02-02: 0.5 mL via SUBCUTANEOUS
  Filled 2012-02-02 (×2): qty 0.5

## 2012-02-02 MED ORDER — TETANUS-DIPHTH-ACELL PERTUSSIS 5-2.5-18.5 LF-MCG/0.5 IM SUSP
0.5000 mL | Freq: Once | INTRAMUSCULAR | Status: AC
  Administered 2012-02-02: 0.5 mL via INTRAMUSCULAR

## 2012-02-02 NOTE — Discharge Summary (Signed)
Obstetric Discharge Summary Reason for Admission: induction of labor Prenatal Procedures: none Intrapartum Procedures: cesarean: low cervical, transverse Postpartum Procedures: none Complications-Operative and Postpartum: none Hemoglobin  Date Value Range Status  01/31/2012 9.8* 12.0 - 15.0 g/dL Final     HCT  Date Value Range Status  01/31/2012 30.0* 36.0 - 46.0 % Final    Physical Exam:  General: alert, cooperative and no distress Lochia: appropriate Uterine Fundus: firm Incision: healing well, no significant drainage, no significant erythema DVT Evaluation: No evidence of DVT seen on physical exam.  Discharge Diagnoses: Term Pregnancy-delivered  Discharge Information: Date: 02/02/2012 Activity: unrestricted Diet: routine Medications: Percocet Condition: stable and improved Instructions: refer to practice specific booklet Discharge to: home Follow-up Information    Follow up with [facility]. In 1 week.   Contact information:   [address] [place] [address]          Newborn Data: Live born female  Birth Weight: 8 lb 4.5 oz (3755 g) APGAR: 4, 9  Home with mother.  Stephanie Hopkins 02/02/2012, 6:44 AM

## 2012-02-03 ENCOUNTER — Telehealth: Payer: Self-pay | Admitting: *Deleted

## 2012-02-03 DIAGNOSIS — R110 Nausea: Secondary | ICD-10-CM

## 2012-02-03 MED ORDER — ONDANSETRON HCL 8 MG PO TABS: 8.0000 mg | ORAL_TABLET | Freq: Three times a day (TID) | ORAL | Status: DC | PRN

## 2012-02-03 NOTE — Telephone Encounter (Signed)
Pt called requesting a RX for Zofran because her pain medicine makes her nauseated.  Per Dr Marice Potter may send in RX for Zofran with no RF's.

## 2012-02-03 NOTE — Discharge Summary (Signed)
Attestation of Attending Supervision of Advanced Practitioner (CNM/NP): Evaluation and management procedures were performed by the Advanced Practitioner under my supervision and collaboration.  I have reviewed the Advanced Practitioner's note and chart, and I agree with the management and plan.  HARRAWAY-SMITH, Najat Olazabal 12:11 PM

## 2012-02-04 NOTE — Op Note (Signed)
I agree with documentation of cesarean. I was present and in charge throughout cesarean

## 2012-02-05 ENCOUNTER — Encounter: Payer: Self-pay | Admitting: Advanced Practice Midwife

## 2012-02-05 ENCOUNTER — Ambulatory Visit (INDEPENDENT_AMBULATORY_CARE_PROVIDER_SITE_OTHER): Payer: Medicaid Other | Admitting: Advanced Practice Midwife

## 2012-02-05 VITALS — BP 146/95 | HR 106 | Temp 97.1°F | Resp 16 | Ht 65.0 in | Wt 201.0 lb

## 2012-02-05 DIAGNOSIS — G8918 Other acute postprocedural pain: Secondary | ICD-10-CM

## 2012-02-05 MED ORDER — OXYCODONE-ACETAMINOPHEN 5-325 MG PO TABS: 1.0000 | ORAL_TABLET | ORAL | Status: DC | PRN

## 2012-02-08 ENCOUNTER — Telehealth: Payer: Self-pay | Admitting: *Deleted

## 2012-02-08 DIAGNOSIS — B3731 Acute candidiasis of vulva and vagina: Secondary | ICD-10-CM

## 2012-02-08 DIAGNOSIS — B373 Candidiasis of vulva and vagina: Secondary | ICD-10-CM

## 2012-02-08 MED ORDER — FLUCONAZOLE 150 MG PO TABS: ORAL_TABLET | ORAL | Status: DC

## 2012-02-08 NOTE — Telephone Encounter (Signed)
Pt called c/o white discharge and vaginal being very itchy.  RX for Diflucan sent to [facility] [place] per Dr Marice Potter.

## 2012-02-15 ENCOUNTER — Ambulatory Visit: Payer: Medicaid Other | Admitting: Advanced Practice Midwife

## 2012-02-16 ENCOUNTER — Telehealth: Payer: Self-pay | Admitting: *Deleted

## 2012-02-16 NOTE — Telephone Encounter (Signed)
Pt called c/oing urinary burning and wanted an ATB called to pharmacy.  Explained that she would need to be seen here or @ Urgent Care.  She would need to have a urine culture.  Suggested AZO OTC until she can get to the DR.  I explained that this was a temporary fix to help with spasms not an ATB.

## 2012-02-19 ENCOUNTER — Encounter: Payer: Self-pay | Admitting: Advanced Practice Midwife

## 2012-02-19 ENCOUNTER — Ambulatory Visit (INDEPENDENT_AMBULATORY_CARE_PROVIDER_SITE_OTHER): Payer: Medicaid Other | Admitting: Advanced Practice Midwife

## 2012-02-19 VITALS — BP 124/84 | HR 83 | Resp 16 | Ht 65.0 in | Wt 191.0 lb

## 2012-02-19 DIAGNOSIS — T8140XA Infection following a procedure, unspecified, initial encounter: Secondary | ICD-10-CM

## 2012-02-19 DIAGNOSIS — T8149XA Infection following a procedure, other surgical site, initial encounter: Secondary | ICD-10-CM

## 2012-02-19 DIAGNOSIS — R350 Frequency of micturition: Secondary | ICD-10-CM

## 2012-02-19 LAB — POCT URINALYSIS DIPSTICK
Nitrite, UA: NEGATIVE
Protein, UA: NEGATIVE
Urobilinogen, UA: NEGATIVE

## 2012-02-19 MED ORDER — SULFAMETHOXAZOLE-TRIMETHOPRIM 800-160 MG PO TABS: 1.0000 | ORAL_TABLET | Freq: Two times a day (BID) | ORAL | Status: DC

## 2012-02-19 MED ORDER — KETOROLAC TROMETHAMINE 10 MG PO TABS: 10.0000 mg | ORAL_TABLET | Freq: Four times a day (QID) | ORAL | Status: DC | PRN

## 2012-02-19 NOTE — Progress Notes (Signed)
  Subjective:    Patient ID: Stephanie Hopkins, female    DOB: 1985/05/17, 26 y.o.   MRN: [id number]  HPI: Reports dysuria and frequency three days ago, improved w/ increasing fluids and ? purulent incision drainage. Denies fever, chills, flank pain. Not taking AZO. Feeling much better than last visit. Worried about Hx MRSA infections. Concerned that she way have   Review of SystemsROS: Pertinent findings noted in HPI.     Objective:   Physical Exam General: NAD, A&Ox4. Normal mood. Abd: 1.5 cm area right of ML w/ scant purulent drainage. No erythema. Slightly tender. Abd NT. Most steristrips in place. Very adherent.  Back: No CVAT     Assessment & Plan:   1. Urinary frequency  POCT Urinalysis Dipstick, Urine Culture, sulfamethoxazole-trimethoprim (BACTRIM DS) 800-160 MG per tablet  2. Wound, surgical, infected  sulfamethoxazole-trimethoprim (BACTRIM DS) 800-160 MG per tablet, ketorolac (TORADOL) 10 MG tablet   F/U 1 week or PRN for fever or worsening of Sx. Take steristrips off in shower. Much easier when wet. Dry incision thoroughly.   [place], [place]

## 2012-02-21 LAB — URINE CULTURE: Colony Count: >=100000

## 2012-02-22 ENCOUNTER — Telehealth: Payer: Self-pay | Admitting: *Deleted

## 2012-02-22 DIAGNOSIS — T8149XA Infection following a procedure, other surgical site, initial encounter: Secondary | ICD-10-CM

## 2012-02-22 MED ORDER — PROPOXYPHENE N-APAP 100-650 MG PO TABS: 1.0000 | ORAL_TABLET | Freq: Four times a day (QID) | ORAL | Status: DC | PRN

## 2012-02-22 NOTE — Telephone Encounter (Deleted)
Pt called and stated that the Toradol that she received on Friday makes her nauseated and doesn't seem to be taking care of the pain from the infected incision.  OK'd per Dr Marice Potter may send in a RX for Darvocet n-100 # 20 one every 4-6 hrs.  If pain is not improved will need to see pt in the office.

## 2012-02-22 NOTE — Telephone Encounter (Signed)
Pt called stating that she was RX Toradol on Friday and that it makes her nauseated and is requesting something different.  Since many things cannot be called into pharmacy [facility] 800mg  [facility]  If not improved will need to see pt in the office for further treatment.

## 2012-02-23 NOTE — Progress Notes (Addendum)
  Subjective:    Patient ID: Stephanie Hopkins, female    DOB: April 21, 1985, 26 y.o.   MRN: [id number]  HPI: Pt is 1 week PP C/S here for incision check. Ibuprofen not controlling incision pain. Denies fever, chills, bleeding, drainage from incision, abd pain. Breastfeeding going OK. Milk coming in. Some soreness.   Review of SystemsPertinent findings in HPI.      Objective:   Physical Exam General: A&Ox4. Tearful, emotional. Changes position slowly w/ guarding.  Breast: Mild engorgement. No masses or erythema. Nipples normal color, intact.  Abd: All steristrips in place. Old blood and ?scant drainage right of ML. Two steristrips removed. No erythema, active bleeding or drainage. Very slight opening of incision. Otherwise incision well-approximated. Abd NT     Assessment & Plan:   1. Post-op pain  OxyCODONE-acetaminophen (ROXICET) 5-325 MG per tablet   Plan: Continue Ibuprofen on schedule. Let steristrips come off in shower. Dry incision thoroughly. Reviewed Sx of infection. Discussed transition to motherhood, baby blues, expectations for C/S recovery vs signs of complications. Husband present and supportive. Encouraged rest, healthy diet. F/U 1 week or PRN.  Kapil Petropoulos  No PIH Sx. Repeat BP normal. PIH precautions.

## 2012-02-26 ENCOUNTER — Ambulatory Visit: Payer: Medicaid Other

## 2012-02-29 ENCOUNTER — Ambulatory Visit: Payer: Medicaid Other | Admitting: Advanced Practice Midwife

## 2012-03-03 ENCOUNTER — Ambulatory Visit (INDEPENDENT_AMBULATORY_CARE_PROVIDER_SITE_OTHER): Payer: Medicaid Other | Admitting: Obstetrics & Gynecology

## 2012-03-03 ENCOUNTER — Encounter: Payer: Self-pay | Admitting: Obstetrics & Gynecology

## 2012-03-03 VITALS — BP 124/88 | HR 92 | Temp 96.6°F | Resp 16 | Ht 65.0 in | Wt 188.0 lb

## 2012-03-03 DIAGNOSIS — Z09 Encounter for follow-up examination after completed treatment for conditions other than malignant neoplasm: Secondary | ICD-10-CM

## 2012-03-03 MED ORDER — HYDROCODONE-ACETAMINOPHEN 5-500 MG PO TABS: 1.0000 | ORAL_TABLET | Freq: Four times a day (QID) | ORAL | Status: DC | PRN

## 2012-03-03 NOTE — Progress Notes (Signed)
  Subjective:    Patient ID: Stephanie Hopkins, female    DOB: 1985-04-05, 26 y.o.   MRN: [id number]  HPI  [name] is now 4 weeks po s/p c/s and she is here for a wound check. She has taken a week of Bactrim followed by a week of clinda and is now here because she worries that her wound is infected. She complains of some crying spells but denies HI and SI. She declines medicine for this. She complains of abd tenderness not relieved by IBU or torodol and she would like some vicodin. She also complains that keeping a minipad under panus irritates her skin.  Review of Systems     Objective:   Physical Exam  Incision healing great. 2 cm open area at the right end, healing by secondary intention. Absolutely no evidence of infection.     Assessment & Plan:  Post op stable Wound healing well Vicodin #10, no refill

## 2012-03-18 ENCOUNTER — Encounter: Payer: Self-pay | Admitting: Advanced Practice Midwife

## 2012-03-18 ENCOUNTER — Ambulatory Visit (INDEPENDENT_AMBULATORY_CARE_PROVIDER_SITE_OTHER): Payer: Medicaid Other | Admitting: Advanced Practice Midwife

## 2012-03-18 VITALS — BP 119/86 | HR 70 | Temp 98.6°F | Resp 16 | Ht 65.0 in | Wt 187.0 lb

## 2012-03-18 DIAGNOSIS — F53 Postpartum depression: Secondary | ICD-10-CM | POA: Insufficient documentation

## 2012-03-18 DIAGNOSIS — O99345 Other mental disorders complicating the puerperium: Secondary | ICD-10-CM

## 2012-03-18 DIAGNOSIS — F329 Major depressive disorder, single episode, unspecified: Secondary | ICD-10-CM

## 2012-03-18 DIAGNOSIS — O900 Disruption of cesarean delivery wound: Secondary | ICD-10-CM

## 2012-03-18 DIAGNOSIS — F3289 Other specified depressive episodes: Secondary | ICD-10-CM

## 2012-03-18 MED ORDER — SERTRALINE HCL 25 MG PO TABS: 25.0000 mg | ORAL_TABLET | Freq: Every day | ORAL | Status: DC

## 2012-03-18 NOTE — Progress Notes (Signed)
Subjective:     Stephanie Hopkins is a 27 y.o. female who presents for a postpartum visit. She is 6 weeks postpartum following a low cervical transverse Cesarean section. I have fully reviewed the prenatal and intrapartum course. The delivery was at 40.0 gestational weeks. Outcome: primary cesarean section, low transverse incision. Anesthesia: epidural. Postpartum course has been complicated by mild incisional infection separation and infection, improving. Baby's course has been normal. Baby is feeding by both breast and bottle -  . Bleeding no bleeding. Bowel function is normal. Bladder function is normal. Patient is not sexually active. Contraception method is abstinence. Postpartum depression screening: borderline. Reports significant anxiety and mood swings, poor and frequently interrupted sleep due to feeding and anxiety. Reports that she is coping adequately, but wants Tx for anxiety. Husband supportive, but does not know how to help and returned to work full time, busy. Pt denies SI, HI.   The following portions of the patient's history were reviewed and updated as appropriate: allergies, current medications, past family history, past medical history, past social history, past surgical history and problem list.  Review of Systems Pertinent items are noted in HPI.   Objective:    BP 119/86  Pulse 70  Temp 98.6 F (37 C) (Oral)  Resp 16  Ht 5\' 5"  (1.651 m)  Wt 187 lb (84.823 kg)  BMI 31.12 kg/m2  Breastfeeding? Yes  General appearance: alert, cooperative, appears stated age, fatigued and slightly tearful Thyroid: Normal size. Lungs: clear to auscultation bilaterally Heart: regular rate and rhythm, S1, S2 normal, no murmur, click, rub or gallop Abdomen: soft, non-tender; bowel sounds normal; no masses,  no organomegaly. 2 cm separation still present at right of ML of incision. Looks much batter than last exam by me. Small amount of clear, odorless drainage present. NT. Shallow separation.  Healing by secondary intention.  Neurologic: Alert and oriented X 3, normal strength and tone. Normal symmetric reflexes. Normal coordination and gait  Assessment:    Normal postpartum exam except for: 1. Postpartum depression  sertraline (ZOLOFT) 25 MG tablet  2. Postpartum care and examination    3. Separation of cesarean wound with drainage, postpartum      Plan:    1. Contraception: coitus interruptus and condoms 2. Discussed importance of spacing pregnancies at least 2 years apart to reduce risk of uterine rupture.  3. Follow up in: 6 weeks for PP Depression check or as needed.  4. Referred to counseling. If inadequate response, will start Zoloft. Desires low dose.  5. Lengthy discussion about utilizing support system, delegating to family and friends, getting more sleep. 6. Support given.    [place], CNM 03/18/2012 5:01 PM

## 2012-05-12 ENCOUNTER — Inpatient Hospital Stay (HOSPITAL_COMMUNITY)
Admission: AD | Admit: 2012-05-12 | Discharge: 2012-05-13 | Disposition: A | Discharge status: Home / Self Care | Payer: Self-pay | Source: Ambulatory Visit | Attending: Obstetrics & Gynecology | Admitting: Obstetrics & Gynecology

## 2012-05-12 DIAGNOSIS — O900 Disruption of cesarean delivery wound: Secondary | ICD-10-CM

## 2012-05-12 DIAGNOSIS — O99893 Other specified diseases and conditions complicating puerperium: Secondary | ICD-10-CM | POA: Insufficient documentation

## 2012-05-12 DIAGNOSIS — G43109 Migraine with aura, not intractable, without status migrainosus: Secondary | ICD-10-CM | POA: Insufficient documentation

## 2012-05-12 DIAGNOSIS — O909 Complication of the puerperium, unspecified: Secondary | ICD-10-CM | POA: Insufficient documentation

## 2012-05-12 DIAGNOSIS — M79609 Pain in unspecified limb: Secondary | ICD-10-CM | POA: Insufficient documentation

## 2012-05-12 DIAGNOSIS — L089 Local infection of the skin and subcutaneous tissue, unspecified: Secondary | ICD-10-CM | POA: Insufficient documentation

## 2012-05-12 DIAGNOSIS — B372 Candidiasis of skin and nail: Secondary | ICD-10-CM | POA: Insufficient documentation

## 2012-05-12 DIAGNOSIS — M79605 Pain in left leg: Secondary | ICD-10-CM

## 2012-05-12 MED ORDER — IBUPROFEN 600 MG PO TABS: 600.0000 mg | ORAL_TABLET | Freq: Four times a day (QID) | ORAL | Status: DC | PRN

## 2012-05-12 MED ORDER — FLUCONAZOLE 150 MG PO TABS
150.0000 mg | ORAL_TABLET | Freq: Once | ORAL | Status: AC
  Administered 2012-05-13: 150 mg via ORAL
  Filled 2012-05-12: qty 1

## 2012-05-12 MED ORDER — OXYCODONE-ACETAMINOPHEN 5-325 MG PO TABS
2.0000 | ORAL_TABLET | ORAL | Status: AC
  Administered 2012-05-13: 2 via ORAL
  Filled 2012-05-12: qty 2

## 2012-05-12 NOTE — MAU Provider Note (Signed)
Chief Complaint: Headache   First Provider Initiated Contact with Patient 05/12/12 2249     SUBJECTIVE HPI: Stephanie Hopkins is a 27 y.o. G2P1011 15 weeks postpartum after C/S delivery who presents to maternity admissions reporting poor healing of C/S wound, migraine, and left knee pain.  She reports she has called the [place] office several times but no provider has been available to speak with her and she was told to come to the MAU.  Her C/S incision has an open place on the left side that has never healed.  It is not painful but does have clear drainage sometimes and now it has a red rash around the area that is new. She denies recent injury to her left knee but it has been hurting with movement and weight bearing x2 weeks.  She has frequent migraines that improved with pregnancy but have returned and vary between weekly and monthly.  She had a neurology work-up a few years ago which was normal. She does report recent sleep deprivation r/t increased frequency of breastfeeding.  She denies vaginal bleeding, vaginal itching/burning, urinary symptoms, dizziness, n/v, or fever/chills.     Past Medical History  Diagnosis Date  . Supervision of normal pregnancy 09/14/2011    [name] (transfer from [facility]) Genetic Screen  Reports negative  Anatomic [place]  Normal @ 20 wks; poor visual of spine>rescan nml  Glucose Screen 150 >> needs 3 hr GTT  GBS   Feeding Preference  Breast  Contraception   Circumcision       . Gestational diabetes 12/07/2011    Diet controlled, excellent control.   Deliver at 40wks   . Colitis    Past Surgical History  Procedure Laterality Date  . 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 Cesarean section  01/30/2012    Procedure: CESAREAN SECTION;  Surgeon: Tilda Burrow, MD;  Location: [facility];  Service: Gynecology;  Laterality: N/A;  Primary Cesarean Section Delivery Baby Boy @ [phone], Apgars 4/9    History   Social History  . Marital  Status: Married    Spouse Name: N/A    Number of Children: N/A  . Years of Education: N/A   Occupational History  .      Homemaker   Social History Main Topics  . Smoking status: Former Games developer  . Smokeless tobacco: Not on file  . Alcohol Use: No  . Drug Use: No  . Sexually Active: Yes   Other Topics Concern  . Not on file   Social History Narrative  . No narrative on file   No current facility-administered medications on file prior to encounter.   Current Outpatient Prescriptions on File Prior to Encounter  Medication Sig Dispense Refill  . acetaminophen (TYLENOL) 500 MG tablet Take 1,000 mg by mouth every 6 (six) hours as needed. For leg cramps and headaches      . Lactobacillus (ACIDOPHILUS PO) Take 1 tablet by mouth daily as needed. For cold sores      . Prenatal Vit-Fe Fumarate-FA (MULTIVITAMIN-PRENATAL) 27-0.8 MG TABS Take 1 tablet by mouth daily.       Allergies  Allergen Reactions  . Bactrim (Sulfamethoxazole W-Trimethoprim) Rash  . Latex Itching and Rash    ROS: Pertinent items in HPI  OBJECTIVE Blood pressure 120/81, pulse 104, temperature 98 F (36.7 C), temperature source Oral, resp. rate 20, height 5\' 4"  (1.626 m), weight 82.271 kg (181 lb 6 oz), currently breastfeeding.  GENERAL: Well-developed, well-nourished female in no acute distress.  HEENT: Normocephalic HEART: normal rate RESP: normal effort ABDOMEN: Soft, non-tender, small, 1cm by 0.25 cm wound dehiscence noted on left side of C/S incision. Pink granulation tissue noted in wound without purulent drainage.  No tracking or significant depth to wound.  Surrounding this area is a ring of erythema with mild edema and scaling of skin noted.  Area is clean and dry.   EXTREMITIES: Left knee pain noted with movement/weight bearing, no edema and ROM normal NEURO: Alert and oriented SPECULUM EXAM: Deferred   ASSESSMENT 1. Separation of cesarean wound with drainage, postpartum   2. Musculoskeletal pain of  lower extremity, left   3. Migraine with aura   4. Candidal skin infection     PLAN Dr Thad Ranger to room to evaluate wound and knee pain Percocet x2 tabs in MAU and encouraged pt to go home and try to sleep Discharge home Ibuprofen for knee pain Tylenol/ibuprofen, rest, fluids for h/a Nystatin powder to incision area TID Message sent to [facility] to make f/u appointment with MD next week Return to MAU as needed  Sharen Counter Certified Nurse-Midwife 05/12/2012  11:22 PM

## 2012-05-12 NOTE — MAU Note (Addendum)
PT SAYS  SHE HAS HX OF MIGRAINES-- THIS H/A STARTED AT 1030AM-  TOOK TYLENOL WITH CAEFFINE AND AGAIN AT 230PM- NO RELIEF.    ALSO HAD C/S ON 01-30-2012- DR FERGUSON-   INCISION IS OPEN- APPROX  DIME SIZE ON RIGHT  WITH RASH AROUND IT .ALSO SAYS HAS ODOR.    FEELS PALPITATIONS - STARTED THIS AM  WHEN HEAD HURTS-   HAS BEEN  HAVING CHEST PAIN SINCE 1030AM.    SAYS HER  CALVES AND BEHIND HER KNEES HURT- BOTH  - BUT LEFT IS WORSE.    LAST TIME AT CLINIC- 8 WEEKS AGO- [place]W [place].      BREAST FEEDING.

## 2012-05-12 NOTE — MAU Note (Addendum)
Pt states having migranes, and increased pain this am. Also states she is concerned about c/s incision healing, states it has a foul odor. Also c/o leg pain behind L Knee  And R calf pain.

## 2012-05-13 ENCOUNTER — Other Ambulatory Visit: Payer: Self-pay | Admitting: Advanced Practice Midwife

## 2012-05-13 MED ORDER — NYSTATIN 100000 UNIT/GM EX POWD: CUTANEOUS | Status: AC

## 2012-05-18 ENCOUNTER — Telehealth: Payer: Self-pay

## 2012-05-18 NOTE — MAU Provider Note (Signed)
Attestation of Attending Supervision of Advanced Practitioner (CNM/NP): Evaluation and management procedures were performed by the Advanced Practitioner under my supervision and collaboration. I have reviewed the Advanced Practitioner's note and chart, and I agree with the management and plan.  Drevon Plog H. 12:32 PM   

## 2012-05-18 NOTE — Telephone Encounter (Signed)
Called patient and she cannot do Thursday 03-13. She can only do Tuesday. I will call patient back if someone cancel for this coming Tuesday afternoon and patient will double check her husband schedule to see if she can come on Thursday if noone cancel for Tuesday.

## 2012-05-18 NOTE — Telephone Encounter (Signed)
Message copied by Hazle Quant on Wed May 18, 2012  2:19 PM ------      Message from: LEFTWICH-KIRBY, Misty Stanley A      Created: Fri May 13, 2012  3:32 AM      Regarding: Pt needs f/u appointment with MD next week      Contact: [phone]       27 y.o. G2P1011 was seen in MAU for C/S wound healing problems, knee pain, and migraines.  She was treated for yeast around C/S incision site but needs an appointment with one of the doctors to evaluate healing and give treatment options.  Please call her with first available appointment.  Thank you. ------

## 2012-05-24 ENCOUNTER — Telehealth: Payer: Self-pay | Admitting: *Deleted

## 2012-05-24 MED ORDER — FLUCONAZOLE 150 MG PO TABS: 150.0000 mg | ORAL_TABLET | Freq: Once | ORAL | Status: DC

## 2012-05-24 NOTE — Telephone Encounter (Signed)
Pt had called earlier today and spoke w/Tonya. She reported vag yeast sx and requested Rx. I spoke w/Dr. Penne Lash and received order. I called pt and left message that her Rx has been sent to her pharmacy. She may call back if she has additional questions.

## 2012-06-02 ENCOUNTER — Encounter: Payer: Self-pay | Admitting: Obstetrics & Gynecology

## 2012-06-02 ENCOUNTER — Ambulatory Visit (INDEPENDENT_AMBULATORY_CARE_PROVIDER_SITE_OTHER): Payer: Self-pay | Admitting: Obstetrics & Gynecology

## 2012-06-02 VITALS — BP 117/82 | HR 100 | Resp 16 | Ht 65.0 in | Wt 181.0 lb

## 2012-06-02 DIAGNOSIS — O900 Disruption of cesarean delivery wound: Secondary | ICD-10-CM

## 2012-06-02 NOTE — Progress Notes (Signed)
Pt presents for evaluation of wound.  Pt had primary c/s in November and has had wound separation issues since.  Currently there is 1 mc area of granulation tissue on the left aspect of the incision.  It is not separated or deep.  Pt has drainage but thee is not currently.  No odor.  No problems with depression.  Filed Vitals:   06/02/12 1412  BP: 117/82  Pulse: 100  Resp: 16  Height: 5\' 5"  (1.651 m)  Weight: 181 lb (82.101 kg)   Abdominal exam:  Soft, NT, ND Inc:  Granulation tissue at left aspect as described above  A/P Granulation tissue at c/s incision site Weekly silver nitrate until healed.

## 2012-06-09 ENCOUNTER — Encounter: Payer: Self-pay | Admitting: Obstetrics & Gynecology

## 2012-06-09 ENCOUNTER — Ambulatory Visit (INDEPENDENT_AMBULATORY_CARE_PROVIDER_SITE_OTHER): Payer: Self-pay | Admitting: Obstetrics & Gynecology

## 2012-06-09 VITALS — BP 124/82 | HR 80 | Resp 16 | Wt 182.0 lb

## 2012-06-09 DIAGNOSIS — T8131XD Disruption of external operation (surgical) wound, not elsewhere classified, subsequent encounter: Secondary | ICD-10-CM

## 2012-06-09 DIAGNOSIS — Z5189 Encounter for other specified aftercare: Secondary | ICD-10-CM

## 2012-06-09 NOTE — Progress Notes (Signed)
Pt presents for 2nd application of silver nitrate for the granulation tissue.  Area is 25-50% smaller.  Silver nitrate applied.  Pt needs pregnancy test.  Using coitus interuptus for birth control.  Pt encouraged to wear condoms.  Pt refuses other forms of birth control currently.

## 2012-06-09 NOTE — Patient Instructions (Signed)
Contraception Choices  Contraception (birth control) is the use of any methods or devices to prevent pregnancy. Below are some methods to help avoid pregnancy.  HORMONAL METHODS   · Contraceptive implant. This is a thin, plastic tube containing progesterone hormone. It does not contain estrogen hormone. Your caregiver inserts the tube in the inner part of the upper arm. The tube can remain in place for up to 3 years. After 3 years, the implant must be removed. The implant prevents the ovaries from releasing an egg (ovulation), thickens the cervical mucus which prevents sperm from entering the uterus, and thins the lining of the inside of the uterus.  · Progesterone-only injections. These injections are given every 3 months by your caregiver to prevent pregnancy. This synthetic progesterone hormone stops the ovaries from releasing eggs. It also thickens cervical mucus and changes the uterine lining. This makes it harder for sperm to survive in the uterus.  · Birth control pills. These pills contain estrogen and progesterone hormone. They work by stopping the egg from forming in the ovary (ovulation). Birth control pills are prescribed by a caregiver. Birth control pills can also be used to treat heavy periods.  · Minipill. This type of birth control pill contains only the progesterone hormone. They are taken every day of each month and must be prescribed by your caregiver.  · Birth control patch. The patch contains hormones similar to those in birth control pills. It must be changed once a week and is prescribed by a caregiver.  · Vaginal ring. The ring contains hormones similar to those in birth control pills. It is left in the vagina for 3 weeks, removed for 1 week, and then a new one is put back in place. The patient must be comfortable inserting and removing the ring from the vagina. A caregiver's prescription is necessary.  · Emergency contraception. Emergency contraceptives prevent pregnancy after unprotected  sexual intercourse. This pill can be taken right after sex or up to 5 days after unprotected sex. It is most effective the sooner you take the pills after having sexual intercourse. Emergency contraceptive pills are available without a prescription. Check with your pharmacist. Do not use emergency contraception as your only form of birth control.  BARRIER METHODS   · Female condom. This is a thin sheath (latex or rubber) that is worn over the penis during sexual intercourse. It can be used with spermicide to increase effectiveness.  · Female condom. This is a soft, loose-fitting sheath that is put into the vagina before sexual intercourse.  · Diaphragm. This is a soft, latex, dome-shaped barrier that must be fitted by a caregiver. It is inserted into the vagina, along with a spermicidal jelly. It is inserted before intercourse. The diaphragm should be left in the vagina for 6 to 8 hours after intercourse.  · Cervical cap. This is a round, soft, latex or plastic cup that fits over the cervix and must be fitted by a caregiver. The cap can be left in place for up to 48 hours after intercourse.  · Sponge. This is a soft, circular piece of polyurethane foam. The sponge has spermicide in it. It is inserted into the vagina after wetting it and before sexual intercourse.  · Spermicides. These are chemicals that kill or block sperm from entering the cervix and uterus. They come in the form of creams, jellies, suppositories, foam, or tablets. They do not require a prescription. They are inserted into the vagina with an applicator before having sexual intercourse.   The process must be repeated every time you have sexual intercourse.  INTRAUTERINE CONTRACEPTION  · Intrauterine device (IUD). This is a T-shaped device that is put in a woman's uterus during a menstrual period to prevent pregnancy. There are 2 types:  · Copper IUD. This type of IUD is wrapped in copper wire and is placed inside the uterus. Copper makes the uterus and  fallopian tubes produce a fluid that kills sperm. It can stay in place for 10 years.  · Hormone IUD. This type of IUD contains the hormone progestin (synthetic progesterone). The hormone thickens the cervical mucus and prevents sperm from entering the uterus, and it also thins the uterine lining to prevent implantation of a fertilized egg. The hormone can weaken or kill the sperm that get into the uterus. It can stay in place for 5 years.  PERMANENT METHODS OF CONTRACEPTION  · Female tubal ligation. This is when the woman's fallopian tubes are surgically sealed, tied, or blocked to prevent the egg from traveling to the uterus.  · Female sterilization. This is when the female has the tubes that carry sperm tied off (vasectomy). This blocks sperm from entering the vagina during sexual intercourse. After the procedure, the man can still ejaculate fluid (semen).  NATURAL PLANNING METHODS  · Natural family planning. This is not having sexual intercourse or using a barrier method (condom, diaphragm, cervical cap) on days the woman could become pregnant.  · Calendar method. This is keeping track of the length of each menstrual cycle and identifying when you are fertile.  · Ovulation method. This is avoiding sexual intercourse during ovulation.  · Symptothermal method. This is avoiding sexual intercourse during ovulation, using a thermometer and ovulation symptoms.  · Post-ovulation method. This is timing sexual intercourse after you have ovulated.  Regardless of which type or method of contraception you choose, it is important that you use condoms to protect against the transmission of sexually transmitted diseases (STDs). Talk with your caregiver about which form of contraception is most appropriate for you.  Document Released: 03/02/2005 Document Revised: 05/25/2011 Document Reviewed: 07/09/2010  ExitCare® Patient Information ©2013 [employer].

## 2012-11-08 ENCOUNTER — Emergency Department (HOSPITAL_BASED_OUTPATIENT_CLINIC_OR_DEPARTMENT_OTHER): Payer: Self-pay

## 2012-11-08 ENCOUNTER — Encounter (HOSPITAL_BASED_OUTPATIENT_CLINIC_OR_DEPARTMENT_OTHER): Payer: Self-pay | Admitting: *Deleted

## 2012-11-08 ENCOUNTER — Emergency Department (HOSPITAL_BASED_OUTPATIENT_CLINIC_OR_DEPARTMENT_OTHER)
Admission: EM | Admit: 2012-11-08 | Discharge: 2012-11-08 | Disposition: A | Discharge status: Home / Self Care | Payer: Self-pay | Attending: Emergency Medicine | Admitting: Emergency Medicine

## 2012-11-08 DIAGNOSIS — Z8632 Personal history of gestational diabetes: Secondary | ICD-10-CM | POA: Insufficient documentation

## 2012-11-08 DIAGNOSIS — Z87891 Personal history of nicotine dependence: Secondary | ICD-10-CM | POA: Insufficient documentation

## 2012-11-08 DIAGNOSIS — Z8719 Personal history of other diseases of the digestive system: Secondary | ICD-10-CM | POA: Insufficient documentation

## 2012-11-08 DIAGNOSIS — M79605 Pain in left leg: Secondary | ICD-10-CM

## 2012-11-08 DIAGNOSIS — Z91040 Latex allergy status: Secondary | ICD-10-CM | POA: Insufficient documentation

## 2012-11-08 DIAGNOSIS — Z79899 Other long term (current) drug therapy: Secondary | ICD-10-CM | POA: Insufficient documentation

## 2012-11-08 DIAGNOSIS — M79609 Pain in unspecified limb: Secondary | ICD-10-CM | POA: Insufficient documentation

## 2012-11-08 MED ORDER — IBUPROFEN 800 MG PO TABS
800.0000 mg | ORAL_TABLET | Freq: Once | ORAL | Status: AC
  Administered 2012-11-08: 800 mg via ORAL
  Filled 2012-11-08: qty 1

## 2012-11-08 NOTE — ED Notes (Signed)
Patient transported to Ultrasound 

## 2012-11-08 NOTE — ED Notes (Signed)
MD at bedside giving test results and plan of care.

## 2012-11-08 NOTE — ED Notes (Signed)
Left leg pain her thigh and calf. Started appx 2 weeks ago.

## 2012-11-08 NOTE — ED Provider Notes (Signed)
CSN: [id number]     Arrival date & time 11/08/12  1455 History   First MD Initiated Contact with Patient 11/08/12 1537     Chief Complaint  Patient presents with  . Leg Pain   (Consider location/radiation/quality/duration/timing/severity/associated sxs/prior Treatment) Patient is a 27 y.o. female presenting with leg pain. The history is provided by the patient.  Leg Pain Location:  Leg Time since incident:  3 weeks Injury: no   Leg location:  L leg Pain details:    Quality:  Unable to specify   Radiates to:  Does not radiate   Severity:  Severe   Onset quality:  Gradual   Duration:  3 weeks   Timing:  Constant   Progression:  Worsening Chronicity:  Chronic Prior injury to area:  No Relieved by:  Nothing Associated symptoms: no back pain, no fever, no muscle weakness, no neck pain, no numbness, no stiffness and no swelling     Past Medical History  Diagnosis Date  . Supervision of normal pregnancy 09/14/2011    [name] (transfer [facility] [place]) Genetic Screen  Reports negative  Anatomic [place]  Normal @ 20 wks; poor visual of spine>rescan nml  Glucose Screen 150 >> needs 3 hr GTT  GBS   Feeding Preference  Breast  Contraception   Circumcision       . Gestational diabetes 12/07/2011    Diet controlled, excellent control.   Deliver at 40wks   . Colitis    Past Surgical History  Procedure Laterality Date  . 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 Cesarean section  01/30/2012    Procedure: CESAREAN SECTION;  Surgeon: Tilda Burrow, MD;  Location: [facility];  Service: Gynecology;  Laterality: N/A;  Primary Cesarean Section Delivery Baby Boy @ [phone], Apgars 4/9    Family History  Problem Relation Age of Onset  . Hypertension Mother    History  Substance Use Topics  . Smoking status: Former Games developer  . Smokeless tobacco: Not on file  . Alcohol Use: No   OB History   Grav Para Term Preterm Abortions TAB SAB Ect Mult Living   2 1 1  1     1       Review of Systems  Constitutional: Negative for fever.  HENT: Negative for neck pain.   Respiratory: Negative for shortness of breath.   Cardiovascular: Negative for chest pain and leg swelling.  Musculoskeletal: Negative for back pain, joint swelling and stiffness.  Skin: Negative for color change, rash and wound.  All other systems reviewed and are negative.    Allergies  Adhesive; Bactrim; and Latex  Home Medications   Current Outpatient Rx  Name  Route  Sig  Dispense  Refill  . acetaminophen (TYLENOL) 500 MG tablet   Oral   Take 1,000 mg by mouth every 6 (six) hours as needed. For leg cramps and headaches         . Biotin 1 MG CAPS   Oral   Take 1 capsule by mouth 3 (three) times daily with meals.         Marland Kitchen ibuprofen (ADVIL,MOTRIN) 600 MG tablet   Oral   Take 1 tablet (600 mg total) by mouth every 6 (six) hours as needed for pain.   30 tablet   1   . Lactobacillus (ACIDOPHILUS PO)   Oral   Take 1 tablet by mouth daily as needed. For cold sores         .  nystatin (MYCOSTATIN) powder      Apply to affected area 3 times daily   15 g   0   . Prenatal Vit-Fe Fumarate-FA (MULTIVITAMIN-PRENATAL) 27-0.8 MG TABS   Oral   Take 1 tablet by mouth daily.          BP 130/85  Pulse 96  Temp(Src) 98.6 F (37 C) (Oral)  Resp 16  Ht 5\' 4"  (1.626 m)  Wt 173 lb (78.472 kg)  BMI 29.68 kg/m2  SpO2 100% Physical Exam  Nursing note and vitals reviewed. Constitutional: She is oriented to person, place, and time. She appears well-developed and well-nourished.  HENT:  Head: Normocephalic and atraumatic.  Right Ear: External ear normal.  Left Ear: External ear normal.  Nose: Nose normal.  Eyes: Right eye exhibits no discharge. Left eye exhibits no discharge.  Cardiovascular: Normal rate, regular rhythm and normal heart sounds.   Pulses:      Dorsalis pedis pulses are 2+ on the right side, and 2+ on the left side.  Pulmonary/Chest: Effort normal and breath  sounds normal.  Abdominal: Soft. There is no tenderness.  Musculoskeletal:       Thoracic back: She exhibits no tenderness and no bony tenderness.       Lumbar back: She exhibits no tenderness and no bony tenderness.       Left upper leg: She exhibits tenderness (mild, posteriorly). She exhibits no bony tenderness and no swelling.       Left lower leg: She exhibits tenderness (mild, posteriorly). She exhibits no bony tenderness and no swelling.  Neurological: She is alert and oriented to person, place, and time.  Skin: Skin is warm and dry.    ED Course  Procedures (including critical care time) Labs Review Labs Reviewed - No data to display Imaging Review US Venous Img Lower Unilateral Left  11/08/2012   *RADIOLOGY REPORT*  Clinical Data: Leg pain  LEFT LOWER EXTREMITY VENOUS DUPLEX ULTRASOUND  Technique:  Gray-scale sonography with graded compression, as well as color Doppler and duplex ultrasound, were performed to evaluate the deep venous system of the lower extremity from the level of the common femoral vein through the popliteal and proximal calf veins. Spectral Doppler was utilized to evaluate flow at rest and with distal augmentation maneuvers.  Comparison:  None.  Findings: Spontaneous flow with phasicity and augmentation is identified.  No intraluminal filling defect is noted.  Adequate compressibility is noted throughout.  IMPRESSION: No evidence of deep venous thrombosis.   Original Report Authenticated By: Alcide Clever, M.D.    MDM   1. Left leg pain    27 year old female with 3 weeks of left lower extremity pain. Pain is worst and posterior aspects of the thigh and calf. She has not noticed any swelling. She had a couple ultrasound during a pregnancy last year that were negative per her. She states she has had leg pain for about one year but it is different over this past few weeks. Neurovascularly intact. No back pain or back symptoms. No bony tenderness, thus no need for x-ray  at this point. No trauma. Given Motrin for pain and an ultrasound was obtained. No DVT found. Will f/u as outpatient for her chronic pain.    Audree Camel, MD 11/09/12 [phone]

## 2013-06-02 ENCOUNTER — Telehealth: Payer: Self-pay | Admitting: Cardiology

## 2013-06-02 NOTE — Telephone Encounter (Signed)
Left message for pt to call.

## 2013-06-02 NOTE — Telephone Encounter (Signed)
Spoke with pt, dr Stanford Breed has approved the pt to wear an event monitor.  She would like to know the cost before we schedule. Aware the monitor tech I need to talk to is out of the office today Will talk with shelley and call the pt back on Monday Pt agreed with this plan.

## 2013-06-02 NOTE — Telephone Encounter (Signed)
New Message:  Pt is c/o her heart feels like it is skipping a beat. Pt states she is having arrythmias. Pt would like to speak to the nurse.

## 2013-06-02 NOTE — Telephone Encounter (Signed)
Spoke with pt, she is having palpitations as she did before when she was pregnant.  They usually occur 3x a week and usually at night. There is skipping of the heart that will last 1 to 2 hours.  She would like to avoid meds at this time. Will discuss with dr Stanford Breed if wants monitor prior to appt with the pa 06-14-13

## 2013-06-05 NOTE — Telephone Encounter (Signed)
Spoke with pt, aware the monitor cost is about $700. She is unable to afford and reports she has cut out her caffeine and the palpitations have decreased. She wants to keep the appt she has and will call back if anything changes.

## 2013-06-14 ENCOUNTER — Ambulatory Visit: Payer: Self-pay | Admitting: Physician Assistant

## 2013-08-10 ENCOUNTER — Encounter: Payer: Self-pay | Admitting: *Deleted

## 2013-08-10 ENCOUNTER — Ambulatory Visit (INDEPENDENT_AMBULATORY_CARE_PROVIDER_SITE_OTHER): Payer: BC Managed Care – PPO | Admitting: Advanced Practice Midwife

## 2013-08-10 ENCOUNTER — Encounter: Payer: Self-pay | Admitting: Advanced Practice Midwife

## 2013-08-10 VITALS — BP 115/76 | HR 101 | Wt 179.0 lb

## 2013-08-10 DIAGNOSIS — O26849 Uterine size-date discrepancy, unspecified trimester: Secondary | ICD-10-CM

## 2013-08-10 DIAGNOSIS — O34219 Maternal care for unspecified type scar from previous cesarean delivery: Secondary | ICD-10-CM

## 2013-08-10 DIAGNOSIS — Z3480 Encounter for supervision of other normal pregnancy, unspecified trimester: Secondary | ICD-10-CM

## 2013-08-10 NOTE — Progress Notes (Signed)
New OB visit. Has had pink spotting. Transabdominal and transvaginal US done with size < dates.  Unable to see FHR.  Will recheck in one week. Wants to do First trimester screen. Desires repeat C/S.Will do first trimester labs and glucola next week with repeat US.

## 2013-08-10 NOTE — Patient Instructions (Signed)
Pregnancy - First Trimester  During sexual intercourse, millions of sperm go into the vagina. Only 1 sperm will penetrate and fertilize the female egg while it is in the Fallopian tube. One week later, the fertilized egg implants into the wall of the uterus. An embryo begins to develop into a baby. At 6 to 8 weeks, the eyes and face are formed and the heartbeat can be seen on ultrasound. At the end of 12 weeks (first trimester), all the baby's organs are formed. Now that you are pregnant, you will want to do everything you can to have a healthy baby. Two of the most important things are to get good prenatal care and follow your caregiver's instructions. Prenatal care is all the medical care you receive before the baby's birth. It is given to prevent, find, and treat problems during the pregnancy and childbirth.  PRENATAL EXAMS  · During prenatal visits, your weight, blood pressure, and urine are checked. This is done to make sure you are healthy and progressing normally during the pregnancy.  · A pregnant woman should gain 25 to 35 pounds during the pregnancy. However, if you are overweight or underweight, your caregiver will advise you regarding your weight.  · Your caregiver will ask and answer questions for you.  · Blood work, cervical cultures, other necessary tests, and a Pap test are done during your prenatal exams. These tests are done to check on your health and the probable health of your baby. Tests are strongly recommended and done for HIV with your permission. This is the virus that causes AIDS. These tests are done because medicines can be given to help prevent your baby from being born with this infection should you have been infected without knowing it. Blood work is also used to find out your blood type, previous infections, and follow your blood levels (hemoglobin).  · Low hemoglobin (anemia) is common during pregnancy. Iron and vitamins are given to help prevent this. Later in the pregnancy, blood  tests for diabetes will be done along with any other tests if any problems develop.  · You may need other tests to make sure you and the baby are doing well.  CHANGES DURING THE FIRST TRIMESTER   Your body goes through many changes during pregnancy. They vary from person to person. Talk to your caregiver about changes you notice and are concerned about. Changes can include:  · Your menstrual period stops.  · The egg and sperm carry the genes that determine what you look like. Genes from you and your partner are forming a baby. The female genes determine whether the baby is a boy or a girl.  · Your body increases in girth and you may feel bloated.  · Feeling sick to your stomach (nauseous) and throwing up (vomiting). If the vomiting is uncontrollable, call your caregiver.  · Your breasts will begin to enlarge and become tender.  · Your nipples may stick out more and become darker.  · The need to urinate more. Painful urination may mean you have a bladder infection.  · Tiring easily.  · Loss of appetite.  · Cravings for certain kinds of food.  · At first, you may gain or lose a couple of pounds.  · You may have changes in your emotions from day to day (excited to be pregnant or concerned something may go wrong with the pregnancy and baby).  · You may have more vivid and strange dreams.  HOME CARE INSTRUCTIONS   ·   It is very important to avoid all smoking, alcohol and non-prescribed drugs during your pregnancy. These affect the formation and growth of the baby. Avoid chemicals while pregnant to ensure the delivery of a healthy infant.  · Start your prenatal visits by the 12th week of pregnancy. They are usually scheduled monthly at first, then more often in the last 2 months before delivery. Keep your caregiver's appointments. Follow your caregiver's instructions regarding medicine use, blood and lab tests, exercise, and diet.  · During pregnancy, you are providing food for you and your baby. Eat regular, well-balanced  meals. Choose foods such as meat, fish, milk and other low fat dairy products, vegetables, fruits, and whole-grain breads and cereals. Your caregiver will tell you of the ideal weight gain.  · You can help morning sickness by keeping soda crackers at the bedside. Eat a couple before arising in the morning. You may want to use the crackers without salt on them.  · Eating 4 to 5 small meals rather than 3 large meals a day also may help the nausea and vomiting.  · Drinking liquids between meals instead of during meals also seems to help nausea and vomiting.  · A physical sexual relationship may be continued throughout pregnancy if there are no other problems. Problems may be early (premature) leaking of amniotic fluid from the membranes, vaginal bleeding, or belly (abdominal) pain.  · Exercise regularly if there are no restrictions. Check with your caregiver or physical therapist if you are unsure of the safety of some of your exercises. Greater weight gain will occur in the last 2 trimesters of pregnancy. Exercising will help:  · Control your weight.  · Keep you in shape.  · Prepare you for labor and delivery.  · Help you lose your pregnancy weight after you deliver your baby.  · Wear a good support or jogging bra for breast tenderness during pregnancy. This may help if worn during sleep too.  · Ask when prenatal classes are available. Begin classes when they are offered.  · Do not use hot tubs, steam rooms, or saunas.  · Wear your seat belt when driving. This protects you and your baby if you are in an accident.  · Avoid raw meat, uncooked cheese, cat litter boxes, and soil used by cats throughout the pregnancy. These carry germs that can cause birth defects in the baby.  · The first trimester is a good time to visit your dentist for your dental health. Getting your teeth cleaned is okay. Use a softer toothbrush and brush gently during pregnancy.  · Ask for help if you have financial, counseling, or nutritional needs  during pregnancy. Your caregiver will be able to offer counseling for these needs as well as refer you for other special needs.  · Do not take any medicines or herbs unless told by your caregiver.  · Inform your caregiver if there is any mental or physical domestic violence.  · Make a list of emergency phone numbers of family, friends, hospital, and police and fire departments.  · Write down your questions. Take them to your prenatal visit.  · Do not douche.  · Do not cross your legs.  · If you have to stand for long periods of time, rotate you feet or take small steps in a circle.  · You may have more vaginal secretions that may require a sanitary pad. Do not use tampons or scented sanitary pads.  MEDICINES AND DRUG USE IN PREGNANCY  ·   Take prenatal vitamins as directed. The vitamin should contain 1 milligram of folic acid. Keep all vitamins out of reach of children. Only a couple vitamins or tablets containing iron may be fatal to a baby or young child when ingested.  · Avoid use of all medicines, including herbs, over-the-counter medicines, not prescribed or suggested by your caregiver. Only take over-the-counter or prescription medicines for pain, discomfort, or fever as directed by your caregiver. Do not use aspirin, ibuprofen, or naproxen unless directed by your caregiver.  · Let your caregiver also know about herbs you may be using.  · Alcohol is related to a number of birth defects. This includes fetal alcohol syndrome. All alcohol, in any form, should be avoided completely. Smoking will cause low birth rate and premature babies.  · Street or illegal drugs are very harmful to the baby. They are absolutely forbidden. A baby born to an addicted mother will be addicted at birth. The baby will go through the same withdrawal an adult does.  · Let your caregiver know about any medicines that you have to take and for what reason you take them.  SEEK MEDICAL CARE IF:   You have any concerns or worries during your  pregnancy. It is better to call with your questions if you feel they cannot wait, rather than worry about them.  SEEK IMMEDIATE MEDICAL CARE IF:   · An unexplained oral temperature above 102° F (38.9° C) develops, or as your caregiver suggests.  · You have leaking of fluid from the vagina (birth canal). If leaking membranes are suspected, take your temperature and inform your caregiver of this when you call.  · There is vaginal spotting or bleeding. Notify your caregiver of the amount and how many pads are used.  · You develop a bad smelling vaginal discharge with a change in the color.  · You continue to feel sick to your stomach (nauseated) and have no relief from remedies suggested. You vomit blood or coffee ground-like materials.  · You lose more than 2 pounds of weight in 1 week.  · You gain more than 2 pounds of weight in 1 week and you notice swelling of your face, hands, feet, or legs.  · You gain 5 pounds or more in 1 week (even if you do not have swelling of your hands, face, legs, or feet).  · You get exposed to German measles and have never had them.  · You are exposed to fifth disease or chickenpox.  · You develop belly (abdominal) pain. Round ligament discomfort is a common non-cancerous (benign) cause of abdominal pain in pregnancy. Your caregiver still must evaluate this.  · You develop headache, fever, diarrhea, pain with urination, or shortness of breath.  · You fall or are in a car accident or have any kind of trauma.  · There is mental or physical violence in your home.  Document Released: 02/24/2001 Document Revised: 11/25/2011 Document Reviewed: 08/28/2008  [employer]® Patient Information ©2014 [employer].

## 2013-08-10 NOTE — Progress Notes (Signed)
Has had 2 episodes of pink discharge since finding out she was pregnancy

## 2013-08-11 LAB — GC/CHLAMYDIA PROBE AMP
CT Probe RNA: NEGATIVE
GC Probe RNA: NEGATIVE

## 2013-08-12 LAB — CULTURE, OB URINE
COLONY COUNT: NO GROWTH
Organism ID, Bacteria: NO GROWTH

## 2013-08-14 NOTE — Clinical Documentation Improvement (Signed)
Chart accessed for purpose of chart audit in reference to [facility] on [address].

## 2013-08-18 ENCOUNTER — Encounter: Payer: Self-pay | Admitting: Advanced Practice Midwife

## 2013-08-18 ENCOUNTER — Ambulatory Visit (INDEPENDENT_AMBULATORY_CARE_PROVIDER_SITE_OTHER): Payer: BC Managed Care – PPO | Admitting: Advanced Practice Midwife

## 2013-08-18 VITALS — BP 131/89 | HR 95 | Wt 180.0 lb

## 2013-08-18 DIAGNOSIS — O021 Missed abortion: Secondary | ICD-10-CM

## 2013-08-18 DIAGNOSIS — O26849 Uterine size-date discrepancy, unspecified trimester: Secondary | ICD-10-CM

## 2013-08-18 NOTE — Progress Notes (Signed)
Presents for follow up US.  Has some spotting, no pain. US showed no growth in GS.  Yolk sac is almost as big as GS now. No embryo seen. Diagnosis:  Missed AB   Discussed with patient and spouse. Offered Cytotec. Wants to wait expectantly for now. Precautions reviewed. RTO 4 wks or sooner as needed

## 2013-08-18 NOTE — Progress Notes (Signed)
Follow up U/S  GS measures 17x13  Yoke sac 9.8 x 9.8 mm

## 2013-08-18 NOTE — Patient Instructions (Signed)
Miscarriage A miscarriage is the sudden loss of an unborn baby (fetus) before the 20th week of pregnancy. Most miscarriages happen in the first 3 months of pregnancy. Sometimes, it happens before a woman even knows she is pregnant. A miscarriage is also called a "spontaneous miscarriage" or "early pregnancy loss." Having a miscarriage can be an emotional experience. Talk with your caregiver about any questions you may have about miscarrying, the grieving process, and your future pregnancy plans. CAUSES   Problems with the fetal chromosomes that make it impossible for the baby to develop normally. Problems with the baby's genes or chromosomes are most often the result of errors that occur, by chance, as the embryo divides and grows. The problems are not inherited from the parents.  Infection of the cervix or uterus.   Hormone problems.   Problems with the cervix, such as having an incompetent cervix. This is when the tissue in the cervix is not strong enough to hold the pregnancy.   Problems with the uterus, such as an abnormally shaped uterus, uterine fibroids, or congenital abnormalities.   Certain medical conditions.   Smoking, drinking alcohol, or taking illegal drugs.   Trauma.  Often, the cause of a miscarriage is unknown.  SYMPTOMS   Vaginal bleeding or spotting, with or without cramps or pain.  Pain or cramping in the abdomen or lower back.  Passing fluid, tissue, or blood clots from the vagina. DIAGNOSIS  Your caregiver will perform a physical exam. You may also have an ultrasound to confirm the miscarriage. Blood or urine tests may also be ordered. TREATMENT   Sometimes, treatment is not necessary if you naturally pass all the fetal tissue that was in the uterus. If some of the fetus or placenta remains in the body (incomplete miscarriage), tissue left behind may become infected and must be removed. Usually, a dilation and curettage (D and C) procedure is performed.  During a D and C procedure, the cervix is widened (dilated) and any remaining fetal or placental tissue is gently removed from the uterus.  Antibiotic medicines are prescribed if there is an infection. Other medicines may be given to reduce the size of the uterus (contract) if there is a lot of bleeding.  If you have Rh negative blood and your baby was Rh positive, you will need a Rh immunoglobulin shot. This shot will protect any future baby from having Rh blood problems in future pregnancies. HOME CARE INSTRUCTIONS   Your caregiver may order bed rest or may allow you to continue light activity. Resume activity as directed by your caregiver.  Have someone help with home and family responsibilities during this time.   Keep track of the number of sanitary pads you use each day and how soaked (saturated) they are. Write down this information.   Do not use tampons. Do not douche or have sexual intercourse until approved by your caregiver.   Only take over-the-counter or prescription medicines for pain or discomfort as directed by your caregiver.   Do not take aspirin. Aspirin can cause bleeding.   Keep all follow-up appointments with your caregiver.   If you or your partner have problems with grieving, talk to your caregiver or seek counseling to help cope with the pregnancy loss. Allow enough time to grieve before trying to get pregnant again.  SEEK IMMEDIATE MEDICAL CARE IF:   You have severe cramps or pain in your back or abdomen.  You have a fever.  You pass large blood clots (walnut-sized   or larger) ortissue from your vagina. Save any tissue for your caregiver to inspect.   Your bleeding increases.   You have a thick, bad-smelling vaginal discharge.  You become lightheaded, weak, or you faint.   You have chills.  MAKE SURE YOU:  Understand these instructions.  Will watch your condition.  Will get help right away if you are not doing well or get  worse. Document Released: 08/26/2000 Document Revised: 06/27/2012 Document Reviewed: 04/21/2011 [facility] Patient Information 2014 [place].

## 2013-08-21 ENCOUNTER — Telehealth: Payer: Self-pay | Admitting: *Deleted

## 2013-08-21 DIAGNOSIS — O039 Complete or unspecified spontaneous abortion without complication: Secondary | ICD-10-CM

## 2013-08-21 MED ORDER — IBUPROFEN 800 MG PO TABS: 800.0000 mg | ORAL_TABLET | Freq: Three times a day (TID) | ORAL | Status: DC | PRN

## 2013-08-21 NOTE — Telephone Encounter (Signed)
Pt called stating that she had started to bleed and having severe cramping.  She was seen in office Friday with a missed AB.  She is requesting something for pain.  Per protocol Ibuprofen 800 mg sent to pharmacy and pt will use heating pad for cramping.  Instructed to call or go to [facility] if pain becomes intolerable or filling a maxi pad every 15 minutes.  Pt voices understanding.  She is instructed to stay well hydrated.

## 2013-09-15 ENCOUNTER — Emergency Department (HOSPITAL_BASED_OUTPATIENT_CLINIC_OR_DEPARTMENT_OTHER)
Admission: EM | Admit: 2013-09-15 | Discharge: 2013-09-15 | Disposition: A | Discharge status: Home / Self Care | Payer: BC Managed Care – PPO | Attending: Emergency Medicine | Admitting: Emergency Medicine

## 2013-09-15 ENCOUNTER — Emergency Department (HOSPITAL_BASED_OUTPATIENT_CLINIC_OR_DEPARTMENT_OTHER): Payer: BC Managed Care – PPO

## 2013-09-15 ENCOUNTER — Encounter (HOSPITAL_BASED_OUTPATIENT_CLINIC_OR_DEPARTMENT_OTHER): Payer: Self-pay | Admitting: Emergency Medicine

## 2013-09-15 DIAGNOSIS — R1031 Right lower quadrant pain: Secondary | ICD-10-CM | POA: Insufficient documentation

## 2013-09-15 DIAGNOSIS — M79605 Pain in left leg: Secondary | ICD-10-CM

## 2013-09-15 DIAGNOSIS — Z87891 Personal history of nicotine dependence: Secondary | ICD-10-CM | POA: Insufficient documentation

## 2013-09-15 DIAGNOSIS — Z8719 Personal history of other diseases of the digestive system: Secondary | ICD-10-CM | POA: Insufficient documentation

## 2013-09-15 DIAGNOSIS — Z791 Long term (current) use of non-steroidal anti-inflammatories (NSAID): Secondary | ICD-10-CM | POA: Insufficient documentation

## 2013-09-15 DIAGNOSIS — M79609 Pain in unspecified limb: Secondary | ICD-10-CM | POA: Insufficient documentation

## 2013-09-15 DIAGNOSIS — R1032 Left lower quadrant pain: Secondary | ICD-10-CM | POA: Insufficient documentation

## 2013-09-15 DIAGNOSIS — Z79899 Other long term (current) drug therapy: Secondary | ICD-10-CM | POA: Insufficient documentation

## 2013-09-15 DIAGNOSIS — N83209 Unspecified ovarian cyst, unspecified side: Secondary | ICD-10-CM | POA: Insufficient documentation

## 2013-09-15 DIAGNOSIS — Z91040 Latex allergy status: Secondary | ICD-10-CM | POA: Insufficient documentation

## 2013-09-15 DIAGNOSIS — Z3202 Encounter for pregnancy test, result negative: Secondary | ICD-10-CM | POA: Insufficient documentation

## 2013-09-15 LAB — URINALYSIS, ROUTINE W REFLEX MICROSCOPIC
Bilirubin Urine: NEGATIVE
Glucose, UA: NEGATIVE mg/dL
Ketones, ur: NEGATIVE mg/dL
LEUKOCYTES UA: NEGATIVE
Nitrite: NEGATIVE
PH: 6 (ref 5.0–8.0)
Protein, ur: NEGATIVE mg/dL
SPECIFIC GRAVITY, URINE: 1.005 (ref 1.005–1.030)
Urobilinogen, UA: 0.2 mg/dL (ref 0.0–1.0)

## 2013-09-15 LAB — URINE MICROSCOPIC-ADD ON

## 2013-09-15 LAB — CBC WITH DIFFERENTIAL/PLATELET
BASOS PCT: 1 % (ref 0–1)
Basophils Absolute: 0 10*3/uL (ref 0.0–0.1)
Eosinophils Absolute: 0.3 10*3/uL (ref 0.0–0.7)
Eosinophils Relative: 4 % (ref 0–5)
HCT: 39.3 % (ref 36.0–46.0)
HEMOGLOBIN: 13.2 g/dL (ref 12.0–15.0)
LYMPHS ABS: 2.2 10*3/uL (ref 0.7–4.0)
Lymphocytes Relative: 33 % (ref 12–46)
MCH: 30.1 pg (ref 26.0–34.0)
MCHC: 33.6 g/dL (ref 30.0–36.0)
MCV: 89.5 fL (ref 78.0–100.0)
Monocytes Absolute: 0.5 10*3/uL (ref 0.1–1.0)
Monocytes Relative: 8 % (ref 3–12)
NEUTROS ABS: 3.7 10*3/uL (ref 1.7–7.7)
Neutrophils Relative %: 55 % (ref 43–77)
Platelets: 276 10*3/uL (ref 150–400)
RBC: 4.39 MIL/uL (ref 3.87–5.11)
RDW: 12.6 % (ref 11.5–15.5)
WBC: 6.7 10*3/uL (ref 4.0–10.5)

## 2013-09-15 LAB — WET PREP, GENITAL
CLUE CELLS WET PREP: NONE SEEN
Trich, Wet Prep: NONE SEEN
Yeast Wet Prep HPF POC: NONE SEEN

## 2013-09-15 LAB — PREGNANCY, URINE: PREG TEST UR: NEGATIVE

## 2013-09-15 MED ORDER — NAPROXEN 500 MG PO TABS: 500.0000 mg | ORAL_TABLET | Freq: Two times a day (BID) | ORAL | Status: DC

## 2013-09-15 MED ORDER — HYDROCODONE-ACETAMINOPHEN 5-325 MG PO TABS: 2.0000 | ORAL_TABLET | ORAL | Status: DC | PRN

## 2013-09-15 NOTE — Discharge Instructions (Signed)
Your pelvic ultrasound today shows that you have a cyst on your right ovary. The ultrasound of your left leg shows no blood clot. Follow up with your doctor for the problems you are having. We are treating your pain.

## 2013-09-15 NOTE — ED Provider Notes (Signed)
CSN: [id number]     Arrival date & time 09/15/13  1302 History   First MD Initiated Contact with Patient 09/15/13 1410     Chief Complaint  Patient presents with  . Leg Pain     (Consider location/radiation/quality/duration/timing/severity/associated sxs/prior Treatment) Patient is a 28 y.o. female presenting with leg pain. The history is provided by the patient.  Leg Pain Location:  Leg Time since incident:  5 days Injury: no   Leg location:  L upper leg Pain details:    Quality:  Aching and shooting   Severity:  Moderate   Onset quality:  Gradual   Duration:  5 days   Timing:  Constant   Progression:  Worsening Chronicity:  New Dislocation: no   Foreign body present:  No foreign bodies Prior injury to area:  No Relieved by:  Nothing Worsened by:  Bearing weight and activity Ineffective treatments:  Acetaminophen and NSAIDs Associated symptoms: no back pain and no fever    Stephanie Hopkins is a gastroenteritis female who presents to the ED with left leg pain that started 5 days ago and is located in the thigh. Sometimes it goes up the leg and sometimes down the leg. She has a bruise to the upper leg that she does not know how she got.  She also complains of low abdominal pain that started 3 days ago. She had sex and the pain got a lot worse. She had SAB @ [redacted] weeks gestation 08/25/2013.   Past Medical History  Diagnosis Date  . Supervision of normal pregnancy 09/14/2011    [facility] (transfer from [place]) Genetic Screen  Reports negative  Anatomic [place]  Normal @ 20 wks; poor visual of spine>rescan nml  Glucose Screen 150 >> needs 3 hr GTT  GBS   Feeding Preference  Breast  Contraception   Circumcision       . Colitis    Past Surgical History  Procedure Laterality Date  . 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 Cesarean section  01/30/2012    Procedure: CESAREAN SECTION;  Surgeon: Jonnie Kind, MD;  Location: [facility];  Service:  Gynecology;  Laterality: N/A;  Primary Cesarean Section Delivery Baby Boy @ [phone], Apgars 4/9    Family History  Problem Relation Age of Onset  . Hypertension Mother    History  Substance Use Topics  . Smoking status: Former Research scientist (life sciences)  . Smokeless tobacco: Not on file  . Alcohol Use: No   OB History   Grav Para Term Preterm Abortions TAB SAB Ect Mult Living   3 1 1  1     1      Review of Systems  Constitutional: Negative for fever and chills.  HENT: Negative.   Eyes: Negative for visual disturbance.  Respiratory: Negative for cough and wheezing.   Cardiovascular: Positive for palpitations.  Gastrointestinal: Positive for abdominal pain. Negative for nausea and vomiting.  Genitourinary: Positive for frequency and dyspareunia. Negative for dysuria, urgency, vaginal bleeding and vaginal discharge.  Musculoskeletal: Negative for back pain and myalgias.  Skin: Negative for rash.  Neurological: Negative for seizures, syncope and headaches.  Psychiatric/Behavioral: Negative for confusion. The patient is not nervous/anxious.       Allergies  Adhesive; Bactrim; and Latex  Home Medications   Prior to Admission medications   Medication Sig Start Date End Date Taking? Authorizing Provider  acetaminophen (TYLENOL) 500 MG tablet Take 1,000 mg by mouth every  6 (six) hours as needed. For leg cramps and headaches    Historical Provider, MD  diphenhydrAMINE (BENADRYL) 25 mg capsule Take 25 mg by mouth as needed.    Historical Provider, MD  ibuprofen (ADVIL,MOTRIN) 800 MG tablet Take 800 mg by mouth every 8 (eight) hours as needed.    Historical Provider, MD  ibuprofen (ADVIL,MOTRIN) 800 MG tablet Take 1 tablet (800 mg total) by mouth every 8 (eight) hours as needed. 08/21/13   Emily Filbert, MD  loratadine (CLARITIN) 10 MG tablet Take 10 mg by mouth daily.    Historical Provider, MD  Prenatal Vit-Fe Fumarate-FA (MULTIVITAMIN-PRENATAL) 27-0.8 MG TABS Take 1 tablet by mouth daily.    Historical  Provider, MD   BP 116/79  Pulse 69  Temp(Src) 97.7 F (36.5 C) (Oral)  Resp 20  Ht 5\' 5"  (1.651 m)  Wt 182 lb (82.555 kg)  BMI 30.29 kg/m2  SpO2 100%  LMP 06/09/2013  Breastfeeding? Unknown Physical Exam  Nursing note and vitals reviewed. Constitutional: She is oriented to person, place, and time. She appears well-developed and well-nourished. No distress.  HENT:  Head: Normocephalic and atraumatic.  Eyes: Conjunctivae and EOM are normal.  Neck: Neck supple.  Cardiovascular: Normal rate, regular rhythm and normal heart sounds.   Pulmonary/Chest: Effort normal.  Abdominal: Soft. Bowel sounds are normal. There is tenderness in the right lower quadrant and left lower quadrant. There is no rigidity, no rebound, no guarding and no CVA tenderness.  Genitourinary:  External genitalia without lesions. Scant blood vaginal vault. Mild CMT, right adnexal tenderness. Uterus without palpable enlargement.   Musculoskeletal: Normal range of motion.       Legs: There is a small area of ecchymosis to the left upper leg. Full range of motion of the knee without pain. Mild tenderness of the left thigh with deep palpation. Pedal pulses equal, adequate circulation, good touch sensation.   Neurological: She is alert and oriented to person, place, and time. No cranial nerve deficit.  Skin: Skin is warm and dry.  Psychiatric: She has a normal mood and affect. Her behavior is normal.    ED Course  Procedures (including critical care time) Labs Review Results for orders placed during the hospital encounter of 09/15/13 (from the past 24 hour(s))  URINALYSIS, ROUTINE W REFLEX MICROSCOPIC     Status: Abnormal   Collection Time    09/15/13  1:18 PM      Result Value Ref Range   Color, Urine YELLOW  YELLOW   APPearance CLEAR  CLEAR   Specific Gravity, Urine 1.005  1.005 - 1.030   pH 6.0  5.0 - 8.0   Glucose, UA NEGATIVE  NEGATIVE mg/dL   Hgb urine dipstick SMALL (*) NEGATIVE   Bilirubin Urine  NEGATIVE  NEGATIVE   Ketones, ur NEGATIVE  NEGATIVE mg/dL   Protein, ur NEGATIVE  NEGATIVE mg/dL   Urobilinogen, UA 0.2  0.0 - 1.0 mg/dL   Nitrite NEGATIVE  NEGATIVE   Leukocytes, UA NEGATIVE  NEGATIVE  PREGNANCY, URINE     Status: None   Collection Time    09/15/13  1:18 PM      Result Value Ref Range   Preg Test, Ur NEGATIVE  NEGATIVE  URINE MICROSCOPIC-ADD ON     Status: None   Collection Time    09/15/13  1:18 PM      Result Value Ref Range   Squamous Epithelial / LPF RARE  RARE   WBC, UA 0-2  <3  WBC/hpf   RBC / HPF 0-2  <3 RBC/hpf   Bacteria, UA RARE  RARE  CBC WITH DIFFERENTIAL     Status: None   Collection Time    09/15/13  3:00 PM      Result Value Ref Range   WBC 6.7  4.0 - 10.5 K/uL   RBC 4.39  3.87 - 5.11 MIL/uL   Hemoglobin 13.2  12.0 - 15.0 g/dL   HCT 39.3  36.0 - 46.0 %   MCV 89.5  78.0 - 100.0 fL   MCH 30.1  26.0 - 34.0 pg   MCHC 33.6  30.0 - 36.0 g/dL   RDW 12.6  11.5 - 15.5 %   Platelets 276  150 - 400 K/uL   Neutrophils Relative % 55  43 - 77 %   Neutro Abs 3.7  1.7 - 7.7 K/uL   Lymphocytes Relative 33  12 - 46 %   Lymphs Abs 2.2  0.7 - 4.0 K/uL   Monocytes Relative 8  3 - 12 %   Monocytes Absolute 0.5  0.1 - 1.0 K/uL   Eosinophils Relative 4  0 - 5 %   Eosinophils Absolute 0.3  0.0 - 0.7 K/uL   Basophils Relative 1  0 - 1 %   Basophils Absolute 0.0  0.0 - 0.1 K/uL  WET PREP, GENITAL     Status: Abnormal   Collection Time    09/15/13  3:02 PM      Result Value Ref Range   Yeast Wet Prep HPF POC NONE SEEN  NONE SEEN   Trich, Wet Prep NONE SEEN  NONE SEEN   Clue Cells Wet Prep HPF POC NONE SEEN  NONE SEEN   WBC, Wet Prep HPF POC FEW (*) NONE SEEN    US Transvaginal Non-ob  09/15/2013   CLINICAL DATA:  Dyspareunia for 3 days  EXAM: TRANSABDOMINAL AND TRANSVAGINAL ULTRASOUND OF PELVIS  TECHNIQUE: Both transabdominal and transvaginal ultrasound examinations of the pelvis were performed. Transabdominal technique was performed for global imaging of the  pelvis including uterus, ovaries, adnexal regions, and pelvic cul-de-sac. It was necessary to proceed with endovaginal exam following the transabdominal exam to visualize the ovaries and endometrium.  COMPARISON:  None  FINDINGS: Uterus  Measurements: 7.7 x 3.9 x 4.8 cm. No fibroids or other mass visualized.  Endometrium  Thickness: 11 mm.  No focal abnormality visualized.  Right ovary  Measurements: 3.2 x 2.8 x 3 cm. There is a 2.8 x 2.3 x 2.1 cm complex hypoechoic right ovarian mass with a few internal septations. There is no internal Doppler flow or mural nodule.  Left ovary  Measurements: 2.3 x 1.7 x 1.8 cm. Normal appearance/no adnexal mass.  Other findings  No free fluid.  IMPRESSION: Mildly complex hypoechoic right ovarian mass measuring 2.8 x 2.3 x 2.1 cm which may represent a hemorrhagic cyst or complex cystic mass. Recommend follow-up pelvic ultrasound in 6-8 weeks.   Electronically Signed   By: Kathreen Devoid   On: 09/15/2013 16:18   US Pelvis Complete  09/15/2013   CLINICAL DATA:  Dyspareunia for 3 days  EXAM: TRANSABDOMINAL AND TRANSVAGINAL ULTRASOUND OF PELVIS  TECHNIQUE: Both transabdominal and transvaginal ultrasound examinations of the pelvis were performed. Transabdominal technique was performed for global imaging of the pelvis including uterus, ovaries, adnexal regions, and pelvic cul-de-sac. It was necessary to proceed with endovaginal exam following the transabdominal exam to visualize the ovaries and endometrium.  COMPARISON:  None  FINDINGS: Uterus  Measurements: 7.7 x 3.9 x 4.8 cm. No fibroids or other mass visualized.  Endometrium  Thickness: 11 mm.  No focal abnormality visualized.  Right ovary  Measurements: 3.2 x 2.8 x 3 cm. There is a 2.8 x 2.3 x 2.1 cm complex hypoechoic right ovarian mass with a few internal septations. There is no internal Doppler flow or mural nodule.  Left ovary  Measurements: 2.3 x 1.7 x 1.8 cm. Normal appearance/no adnexal mass.  Other findings  No free fluid.   IMPRESSION: Mildly complex hypoechoic right ovarian mass measuring 2.8 x 2.3 x 2.1 cm which may represent a hemorrhagic cyst or complex cystic mass. Recommend follow-up pelvic ultrasound in 6-8 weeks.   Electronically Signed   By: Kathreen Devoid   On: 09/15/2013 16:18   US Venous Img Lower Unilateral Left  09/15/2013   CLINICAL DATA:  Left leg pain x1 year, bruising  EXAM: LEFT LOWER EXTREMITY VENOUS DOPPLER ULTRASOUND  TECHNIQUE: Gray-scale sonography with graded compression, as well as color Doppler and duplex ultrasound were performed to evaluate the lower extremity deep venous systems from the level of the common femoral vein and including the common femoral, femoral, profunda femoral, popliteal and calf veins including the posterior tibial, peroneal and gastrocnemius veins when visible. The superficial great saphenous vein was also interrogated. Spectral Doppler was utilized to evaluate flow at rest and with distal augmentation maneuvers in the common femoral, femoral and popliteal veins.  COMPARISON:  11/08/2012  FINDINGS: Visualized left lower extremity deep venous system appears patent.  Normal compressibility. Patent color Doppler flow. Satisfactory spectral Doppler with respiratory variation and response to augmentation.  Greater saphenous vein, where visualized, is patent and compressible.  IMPRESSION: No deep venous thrombosis in the visualized left lower extremity.   Electronically Signed   By: Julian Hy M.D.   On: 09/15/2013 17:08    MDM  28 y.o. female with right thigh tenderness without DVT. Lower abdominal pain with left hemorrhagic ovarian cyst. Will treat for pain and inflammation. She will follow up with her GYN. I have reviewed this patient's vital signs, nurses notes, appropriate labs and imaging.  I have discussed findings and plan of care with the patient and she voices understanding and agrees to plan. Stable for discharge without fever, DVT, torsion or ectopic pregnancy.      Medication List    TAKE these medications       HYDROcodone-acetaminophen 5-325 MG per tablet  Commonly known as:  NORCO/VICODIN  Take 2 tablets by mouth every 4 (four) hours as needed.     naproxen 500 MG tablet  Commonly known as:  NAPROSYN  Take 1 tablet (500 mg total) by mouth 2 (two) times daily.      ASK your doctor about these medications       acetaminophen 500 MG tablet  Commonly known as:  TYLENOL  Take 1,000 mg by mouth every 6 (six) hours as needed. For leg cramps and headaches     diphenhydrAMINE 25 mg capsule  Commonly known as:  BENADRYL  Take 25 mg by mouth as needed.     ibuprofen 800 MG tablet  Commonly known as:  ADVIL,MOTRIN  Take 800 mg by mouth every 8 (eight) hours as needed.     ibuprofen 800 MG tablet  Commonly known as:  ADVIL,MOTRIN  Take 1 tablet (800 mg total) by mouth every 8 (eight) hours as needed.     loratadine 10 MG tablet  Commonly known as:  CLARITIN  Take 10 mg by mouth daily.     multivitamin-prenatal 27-0.8 MG Tabs tablet  Take 1 tablet by mouth daily.            [address] [place], [place] 09/16/13 [phone]

## 2013-09-15 NOTE — ED Notes (Signed)
Patient transported to Ultrasound 

## 2013-09-15 NOTE — ED Notes (Addendum)
Left leg pain for a week. No known injury. States she has noticed bruises on her legs recently. Abdominal pain after having sex 3 days ago. Recent miscarriage.

## 2013-09-16 LAB — HIV ANTIBODY (ROUTINE TESTING W REFLEX): HIV 1&2 Ab, 4th Generation: NONREACTIVE

## 2013-09-16 NOTE — ED Provider Notes (Signed)
Medical screening examination/treatment/procedure(s) were performed by non-physician practitioner and as supervising physician I was immediately available for consultation/collaboration.   EKG Interpretation None        Fredia Sorrow, MD 09/16/13 1512

## 2013-09-19 LAB — GC/CHLAMYDIA PROBE AMP

## 2013-09-29 ENCOUNTER — Ambulatory Visit: Payer: BC Managed Care – PPO | Admitting: Physician Assistant

## 2013-10-10 ENCOUNTER — Ambulatory Visit (INDEPENDENT_AMBULATORY_CARE_PROVIDER_SITE_OTHER): Payer: BC Managed Care – PPO | Admitting: Obstetrics & Gynecology

## 2013-10-10 ENCOUNTER — Encounter: Payer: Self-pay | Admitting: Obstetrics & Gynecology

## 2013-10-10 VITALS — BP 108/78 | HR 88 | Resp 16 | Ht 64.0 in | Wt 179.0 lb

## 2013-10-10 DIAGNOSIS — R635 Abnormal weight gain: Secondary | ICD-10-CM | POA: Diagnosis not present

## 2013-10-10 DIAGNOSIS — Z124 Encounter for screening for malignant neoplasm of cervix: Secondary | ICD-10-CM

## 2013-10-10 DIAGNOSIS — Z01419 Encounter for gynecological examination (general) (routine) without abnormal findings: Secondary | ICD-10-CM

## 2013-10-10 DIAGNOSIS — Z1151 Encounter for screening for human papillomavirus (HPV): Secondary | ICD-10-CM

## 2013-10-10 DIAGNOSIS — Z0000 Encounter for general adult medical examination without abnormal findings: Secondary | ICD-10-CM

## 2013-10-10 NOTE — Progress Notes (Signed)
Subjective:    Stephanie Hopkins is a 28 y.o. female who presents for an annual exam. She had a complete miscarriage in June of this year. She had a period 09-24-13 and then had some spotting about 4 days ago.  The patient is sexually active. GYN screening history: last pap: was normal. The patient wears seatbelts: yes. The patient participates in regular exercise: yes. Has the patient ever been transfused or tattooed?: yes. The patient reports that there is not domestic violence in her life.   Menstrual History: OB History   Grav Para Term Preterm Abortions TAB SAB Ect Mult Living   3 1 1  1     1       Menarche age: 73  Patient's last menstrual period was 09/24/2013.    The following portions of the patient's history were reviewed and updated as appropriate: allergies, current medications, past family history, past medical history, past social history, past surgical history and problem list.  Review of Systems A comprehensive review of systems was negative.  She is using withdrawal for contraception at this point but does plan to conceive in the next few months   Objective:    BP 108/78  Pulse 88  Resp 16  Ht 5\' 4"  (1.626 m)  Wt 179 lb (81.194 kg)  BMI 30.71 kg/m2  LMP 09/24/2013  General Appearance:    Alert, cooperative, no distress, appears stated age  Head:    Normocephalic, without obvious abnormality, atraumatic  Eyes:    PERRL, conjunctiva/corneas clear, EOM's intact, fundi    benign, both eyes  Ears:    Normal TM's and external ear canals, both ears  Nose:   Nares normal, septum midline, mucosa normal, no drainage    or sinus tenderness  Throat:   Lips, mucosa, and tongue normal; teeth and gums normal  Neck:   Supple, symmetrical, trachea midline, no adenopathy;    thyroid:  no enlargement/tenderness/nodules; no carotid   bruit or JVD  Back:     Symmetric, no curvature, ROM normal, no CVA tenderness  Lungs:     Clear to auscultation bilaterally, respirations unlabored   Chest Wall:    No tenderness or deformity   Heart:    Regular rate and rhythm, S1 and S2 normal, no murmur, rub   or gallop  Breast Exam:    No tenderness, masses, or nipple abnormality  Abdomen:     Soft, non-tender, bowel sounds active all four quadrants,    no masses, no organomegaly  Genitalia:    Normal female without lesion, discharge or tenderness, NSSR, NT, mobile, normal adnexal exam     Extremities:   Extremities normal, atraumatic, no cyanosis or edema  Pulses:   2+ and symmetric all extremities  Skin:   Skin color, texture, turgor normal, no rashes or lesions  Lymph nodes:   Cervical, supraclavicular, and axillary nodes normal  Neurologic:   CNII-XII intact, normal strength, sensation and reflexes    throughout  .    Assessment:    Healthy female exam.    Plan:     Breast self exam technique reviewed and patient encouraged to perform self-exam monthly. Chlamydia specimen. GC specimen. Thin prep Pap smear.  TSH Continue PNVs

## 2013-10-10 NOTE — Addendum Note (Signed)
Addended by: Asencion Islam on: 10/10/2013 04:06 PM   Modules accepted: Orders

## 2013-10-11 ENCOUNTER — Telehealth: Payer: Self-pay | Admitting: *Deleted

## 2013-10-11 LAB — TSH: TSH: 1.468 u[IU]/mL (ref 0.350–4.500)

## 2013-10-11 NOTE — Telephone Encounter (Signed)
Attempt to leave message on voicemail but not allowing incoming calls.

## 2013-10-12 LAB — CYTOLOGY - PAP

## 2014-01-15 ENCOUNTER — Encounter: Payer: Self-pay | Admitting: Obstetrics & Gynecology

## 2014-02-06 ENCOUNTER — Encounter (HOSPITAL_BASED_OUTPATIENT_CLINIC_OR_DEPARTMENT_OTHER): Payer: Self-pay | Admitting: *Deleted

## 2014-02-06 ENCOUNTER — Emergency Department (HOSPITAL_BASED_OUTPATIENT_CLINIC_OR_DEPARTMENT_OTHER): Payer: BC Managed Care – PPO

## 2014-02-06 ENCOUNTER — Emergency Department (HOSPITAL_BASED_OUTPATIENT_CLINIC_OR_DEPARTMENT_OTHER)
Admission: EM | Admit: 2014-02-06 | Discharge: 2014-02-07 | Disposition: A | Discharge status: Home / Self Care | Payer: BC Managed Care – PPO | Attending: Emergency Medicine | Admitting: Emergency Medicine

## 2014-02-06 DIAGNOSIS — Z91040 Latex allergy status: Secondary | ICD-10-CM | POA: Insufficient documentation

## 2014-02-06 DIAGNOSIS — R51 Headache: Secondary | ICD-10-CM | POA: Diagnosis present

## 2014-02-06 DIAGNOSIS — Z79899 Other long term (current) drug therapy: Secondary | ICD-10-CM | POA: Diagnosis not present

## 2014-02-06 DIAGNOSIS — Z791 Long term (current) use of non-steroidal anti-inflammatories (NSAID): Secondary | ICD-10-CM | POA: Insufficient documentation

## 2014-02-06 DIAGNOSIS — J029 Acute pharyngitis, unspecified: Secondary | ICD-10-CM | POA: Insufficient documentation

## 2014-02-06 DIAGNOSIS — G43909 Migraine, unspecified, not intractable, without status migrainosus: Secondary | ICD-10-CM | POA: Diagnosis not present

## 2014-02-06 MED ORDER — SODIUM CHLORIDE 0.9 % IV SOLN
INTRAVENOUS | Status: DC
  Administered 2014-02-06: 23:00:00 via INTRAVENOUS

## 2014-02-06 MED ORDER — SODIUM CHLORIDE 0.9 % IV BOLUS (SEPSIS)
1000.0000 mL | Freq: Once | INTRAVENOUS | Status: AC
  Administered 2014-02-06: 1000 mL via INTRAVENOUS

## 2014-02-06 MED ORDER — DEXAMETHASONE SODIUM PHOSPHATE 10 MG/ML IJ SOLN
10.0000 mg | Freq: Once | INTRAMUSCULAR | Status: AC
  Administered 2014-02-06: 10 mg via INTRAVENOUS
  Filled 2014-02-06: qty 1

## 2014-02-06 MED ORDER — DIPHENHYDRAMINE HCL 50 MG/ML IJ SOLN
25.0000 mg | Freq: Once | INTRAMUSCULAR | Status: AC
  Administered 2014-02-06: 25 mg via INTRAVENOUS
  Filled 2014-02-06: qty 1

## 2014-02-06 MED ORDER — PROMETHAZINE HCL 25 MG/ML IJ SOLN
12.5000 mg | Freq: Once | INTRAMUSCULAR | Status: AC
  Administered 2014-02-06: 12.5 mg via INTRAVENOUS
  Filled 2014-02-06: qty 1

## 2014-02-06 NOTE — ED Provider Notes (Signed)
CSN: [id number]     Arrival date & time 02/06/14  1945 History  This chart was scribed for Fredia Sorrow, MD by Rayfield Citizen, ED Scribe. This patient was seen in room MH01/MH01 and the patient's care was started at 10:09 PM.    Chief Complaint  Patient presents with  . Migraine   Patient is a 28 y.o. female presenting with migraines. The history is provided by the patient. No language interpreter was used.  Migraine This is a recurrent problem. The current episode started 12 to 24 hours ago. The problem has not changed since onset.Associated symptoms include headaches and shortness of breath. Pertinent negatives include no chest pain and no abdominal pain. Nothing aggravates the symptoms. The symptoms are relieved by ice and lying down. She has tried a cold compress and acetaminophen for the symptoms. The treatment provided no relief.    HPI Comments: Stephanie Hopkins is a 28 y.o. female with past medical history of migraines who presents to the Emergency Department complaining of migraine headache, rated 10/10, beginning this morning upon waking at 0600. She describes her pain as throbbing behind her left eye. She reports photophobia.She also notes a left eye twitch with head pain or fatigue, beginning three weeks ago, which is abnormal for her. She attempted to manage her pain with OTC pain meds with no relief; the only thing that occasionally improves her pain is cold pressure against her face. She denies nausea or vomiting, numbness or weakness in the extremities.   She reports that she had a CT six years PTA but is concerned about her level of pain at present; her migraines have become localized behind her left eye over the last three migraines (i.e. within the last month or two).  Past Medical History  Diagnosis Date  . Supervision of normal pregnancy 09/14/2011    [facility] (transfer from [place]) Genetic Screen  Reports negative  Anatomic [place]  Normal @ 20  wks; poor visual of spine>rescan nml  Glucose Screen 150 >> needs 3 hr GTT  GBS   Feeding Preference  Breast  Contraception   Circumcision       . Colitis    Past Surgical History  Procedure Laterality Date  . 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 Cesarean section  01/30/2012    Procedure: CESAREAN SECTION;  Surgeon: Jonnie Kind, MD;  Location: [facility];  Service: Gynecology;  Laterality: N/A;  Primary Cesarean Section Delivery Baby Boy @ [phone], Apgars 4/9    Family History  Problem Relation Age of Onset  . Hypertension Mother    History  Substance Use Topics  . Smoking status: Former Research scientist (life sciences)  . Smokeless tobacco: Not on file  . Alcohol Use: No   OB History    Gravida Para Term Preterm AB TAB SAB Ectopic Multiple Living   3 1 1  1     1      Review of Systems  Constitutional: Negative for fever and chills.  HENT: Positive for sore throat. Negative for congestion and rhinorrhea.   Eyes: Positive for visual disturbance.  Respiratory: Positive for shortness of breath. Negative for cough.   Cardiovascular: Negative for chest pain.  Gastrointestinal: Negative for nausea, vomiting, abdominal pain and diarrhea.  Genitourinary: Negative for dysuria, frequency and hematuria.  Musculoskeletal: Negative for back pain.  Skin: Negative for rash.  Neurological: Positive for headaches.  Hematological: Does not bruise/bleed easily.  Psychiatric/Behavioral: Negative for confusion.  Allergies  Adhesive; Bactrim; and Latex  Home Medications   Prior to Admission medications   Medication Sig Start Date End Date Taking? Authorizing Provider  acetaminophen (TYLENOL) 500 MG tablet Take 1,000 mg by mouth every 6 (six) hours as needed. For leg cramps and headaches    Historical Provider, MD  cetirizine (ZYRTEC) 10 MG tablet Take 10 mg by mouth daily.    Historical Provider, MD  diphenhydrAMINE (BENADRYL) 25 mg capsule Take 25 mg by mouth as needed.    Historical Provider, MD  ibuprofen  (ADVIL,MOTRIN) 800 MG tablet Take 800 mg by mouth every 8 (eight) hours as needed.    Historical Provider, MD  naproxen (NAPROSYN) 500 MG tablet Take 1 tablet (500 mg total) by mouth 2 (two) times daily. 09/15/13   Hope Bunnie Pion, NP  Prenatal Vit-Fe Fumarate-FA (MULTIVITAMIN-PRENATAL) 27-0.8 MG TABS Take 1 tablet by mouth daily.    Historical Provider, MD   BP 118/67 mmHg  Pulse 60  Temp(Src) 98.2 F (36.8 C) (Oral)  Resp 18  Ht 5\' 5"  (1.651 m)  Wt 179 lb (81.194 kg)  BMI 29.79 kg/m2  SpO2 100%  LMP 02/04/2014 Physical Exam  Constitutional: She is oriented to person, place, and time. She appears well-developed and well-nourished.  HENT:  Head: Normocephalic and atraumatic.  Mucous membranes moist  Eyes: EOM are normal. Pupils are equal, round, and reactive to light. No scleral icterus.  Neck: No tracheal deviation present.  Cardiovascular: Normal rate, regular rhythm and normal heart sounds.  Exam reveals no gallop and no friction rub.   No murmur heard. Pulmonary/Chest: Effort normal and breath sounds normal. She has no wheezes. She has no rales.  Abdominal: Soft. Bowel sounds are normal. There is no tenderness.  Musculoskeletal: She exhibits no edema.  Neurological: She is alert and oriented to person, place, and time.  All cranial nerves intact  Skin: Skin is warm and dry.  Psychiatric: She has a normal mood and affect. Her behavior is normal.  Nursing note and vitals reviewed.   ED Course  Procedures   DIAGNOSTIC STUDIES: Oxygen Saturation is 100% on RA, normal by my interpretation.    COORDINATION OF CARE:  Patient will attempt to reach her husband to drive her home tonight.   10:15 PM Discussed treatment plan with pt at bedside and pt agreed to plan.   Labs Review Labs Reviewed - No data to display  Imaging Review Ct Head Wo Contrast  02/06/2014   CLINICAL DATA:  Severe headache for 1 day.  No injury.  EXAM: CT HEAD WITHOUT CONTRAST  TECHNIQUE: Contiguous axial  images were obtained from the base of the skull through the vertex without intravenous contrast.  COMPARISON:  None.  FINDINGS: The ventricles and sulci are normal for age. No intraparenchymal hemorrhage, mass effect nor midline shift. Patchy supratentorial white matter hypodensities are within normal range for patient's age and though non-specific suggest sequelae of chronic small vessel ischemic disease. No acute large vascular territory infarcts.  No abnormal extra-axial fluid collections. Basal cisterns are patent. Moderate calcific atherosclerosis of the carotid siphons.  No skull fracture. The included ocular globes and orbital contents are non-suspicious. Imaged paranasal sinuses appear well aerated. Trace bilateral mastoid effusions.  IMPRESSION: No acute intracranial process; normal noncontrast CT of the head.  Trace bilateral mastoid effusions.   Electronically Signed   By: Elon Alas   On: 02/06/2014 22:52     EKG Interpretation None      MDM  Final diagnoses:  Migraine    Patient with history of similar headaches over the past 1-2 months. History of migraines in the past but the pattern of the last 2 months is been different. Head CT was done this patient was concerned about some abnormality particularly with the twitching of the left eye. Head CT without any significant findings. If symptoms persist a consideration for follow-up MRI and [facility] neurology referral made. Patient's migraine symptoms treated with migraine cocktail significant improvement. Patient is nontoxic no acute distress. No significant focal neuro deficits noted.   I personally performed the services described in this documentation, which was scribed in my presence. The recorded information has been reviewed and is accurate.       Fredia Sorrow, MD 02/06/14 [phone]

## 2014-02-06 NOTE — ED Notes (Signed)
Migraine headache since this am. Light and noise sensitive.

## 2014-02-06 NOTE — Discharge Instructions (Signed)
Migraine Headache A migraine headache is an intense, throbbing pain on one or both sides of your head. A migraine can last for 30 minutes to several hours. CAUSES  The exact cause of a migraine headache is not always known. However, a migraine may be caused when nerves in the brain become irritated and release chemicals that cause inflammation. This causes pain. Certain things may also trigger migraines, such as:  Alcohol.  Smoking.  Stress.  Menstruation.  Aged cheeses.  Foods or drinks that contain nitrates, glutamate, aspartame, or tyramine.  Lack of sleep.  Chocolate.  Caffeine.  Hunger.  Physical exertion.  Fatigue.  Medicines used to treat chest pain (nitroglycerine), birth control pills, estrogen, and some blood pressure medicines. SIGNS AND SYMPTOMS  Pain on one or both sides of your head.  Pulsating or throbbing pain.  Severe pain that prevents daily activities.  Pain that is aggravated by any physical activity.  Nausea, vomiting, or both.  Dizziness.  Pain with exposure to bright lights, loud noises, or activity.  General sensitivity to bright lights, loud noises, or smells. Before you get a migraine, you may get warning signs that a migraine is coming (aura). An aura may include:  Seeing flashing lights.  Seeing bright spots, halos, or zigzag lines.  Having tunnel vision or blurred vision.  Having feelings of numbness or tingling.  Having trouble talking.  Having muscle weakness. DIAGNOSIS  A migraine headache is often diagnosed based on:  Symptoms.  Physical exam.  A CT scan or MRI of your head. These imaging tests cannot diagnose migraines, but they can help rule out other causes of headaches. TREATMENT Medicines may be given for pain and nausea. Medicines can also be given to help prevent recurrent migraines.  HOME CARE INSTRUCTIONS  Only take over-the-counter or prescription medicines for pain or discomfort as directed by your  health care provider. The use of long-term narcotics is not recommended.  Lie down in a dark, quiet room when you have a migraine.  Keep a journal to find out what may trigger your migraine headaches. For example, write down:  What you eat and drink.  How much sleep you get.  Any change to your diet or medicines.  Limit alcohol consumption.  Quit smoking if you smoke.  Get 7-9 hours of sleep, or as recommended by your health care provider.  Limit stress.  Keep lights dim if bright lights bother you and make your migraines worse. SEEK IMMEDIATE MEDICAL CARE IF:   Your migraine becomes severe.  You have a fever.  You have a stiff neck.  You have vision loss.  You have muscular weakness or loss of muscle control.  You start losing your balance or have trouble walking.  You feel faint or pass out.  You have severe symptoms that are different from your first symptoms. MAKE SURE YOU:   Understand these instructions.  Will watch your condition.  Will get help right away if you are not doing well or get worse. Document Released: 03/02/2005 Document Revised: 07/17/2013 Document Reviewed: 11/07/2012 [facility] Patient Information 2015 [place], [place]. This information is not intended to replace advice given to you by your health care provider. Make sure you discuss any questions you have with your health care provider.  Make appointment to follow-up with [facility] neurology the symptoms persist their evaluation will be important. May need an MRI. Return for any new or worse symptoms. Rest is much as possible.

## 2014-02-28 ENCOUNTER — Encounter (HOSPITAL_BASED_OUTPATIENT_CLINIC_OR_DEPARTMENT_OTHER): Payer: Self-pay

## 2014-02-28 ENCOUNTER — Emergency Department (HOSPITAL_BASED_OUTPATIENT_CLINIC_OR_DEPARTMENT_OTHER)
Admission: EM | Admit: 2014-02-28 | Discharge: 2014-02-28 | Disposition: A | Discharge status: Home / Self Care | Payer: BC Managed Care – PPO | Attending: Emergency Medicine | Admitting: Emergency Medicine

## 2014-02-28 DIAGNOSIS — Z87891 Personal history of nicotine dependence: Secondary | ICD-10-CM | POA: Diagnosis not present

## 2014-02-28 DIAGNOSIS — R1012 Left upper quadrant pain: Secondary | ICD-10-CM | POA: Diagnosis present

## 2014-02-28 DIAGNOSIS — Z9889 Other specified postprocedural states: Secondary | ICD-10-CM | POA: Insufficient documentation

## 2014-02-28 DIAGNOSIS — Z79899 Other long term (current) drug therapy: Secondary | ICD-10-CM | POA: Insufficient documentation

## 2014-02-28 DIAGNOSIS — Z8719 Personal history of other diseases of the digestive system: Secondary | ICD-10-CM | POA: Insufficient documentation

## 2014-02-28 DIAGNOSIS — Z3202 Encounter for pregnancy test, result negative: Secondary | ICD-10-CM | POA: Diagnosis not present

## 2014-02-28 DIAGNOSIS — R312 Other microscopic hematuria: Secondary | ICD-10-CM | POA: Diagnosis not present

## 2014-02-28 DIAGNOSIS — Z91040 Latex allergy status: Secondary | ICD-10-CM | POA: Insufficient documentation

## 2014-02-28 DIAGNOSIS — Z791 Long term (current) use of non-steroidal anti-inflammatories (NSAID): Secondary | ICD-10-CM | POA: Insufficient documentation

## 2014-02-28 DIAGNOSIS — R3129 Other microscopic hematuria: Secondary | ICD-10-CM

## 2014-02-28 LAB — URINALYSIS, ROUTINE W REFLEX MICROSCOPIC
BILIRUBIN URINE: NEGATIVE
Glucose, UA: NEGATIVE mg/dL
Ketones, ur: NEGATIVE mg/dL
Leukocytes, UA: NEGATIVE
Nitrite: NEGATIVE
Protein, ur: NEGATIVE mg/dL
SPECIFIC GRAVITY, URINE: 1.005 (ref 1.005–1.030)
UROBILINOGEN UA: 0.2 mg/dL (ref 0.0–1.0)
pH: 6 (ref 5.0–8.0)

## 2014-02-28 LAB — URINE MICROSCOPIC-ADD ON

## 2014-02-28 LAB — PREGNANCY, URINE: Preg Test, Ur: NEGATIVE

## 2014-02-28 MED ORDER — CEPHALEXIN 500 MG PO CAPS: 500.0000 mg | ORAL_CAPSULE | Freq: Three times a day (TID) | ORAL | Status: DC

## 2014-02-28 NOTE — ED Notes (Signed)
C/o left and right abd pain x 1.5 week-denies v/d

## 2014-02-28 NOTE — ED Provider Notes (Signed)
CSN: [id number]     Arrival date & time 02/28/14  2043 History  This chart was scribed for Shaune Pollack, MD by Randa Evens, ED Scribe. This patient was seen in room MH09/MH09 and the patient's care was started at 10:40 PM.    Chief Complaint  Patient presents with  . Abdominal Pain   Patient is a 28 y.o. female presenting with abdominal pain. The history is provided by the patient. No language interpreter was used.  Abdominal Pain Pain location:  LUQ Pain quality: fullness and stabbing   Pain radiates to:  Does not radiate Pain severity:  Mild Onset quality:  Gradual Duration:  10 days Timing:  Intermittent Chronicity:  New Worsened by:  Movement Associated symptoms: no dysuria, no nausea and no vomiting    HPI Comments: Monque Haggar is a 28 y.o. female who presents to the Emergency Department complaining of gradually worsening intermittent LUQ abdominal pain onset 1.5 week ago. She states that the past few days it has been getting worse. She describes the pain as fullness and stabbing. She states that movement makes the pain worse. Pt states she has tried ibuprofen and tylenol that provided some relief. Denies dysuria, frequency, nausea or vomiting. Pt states she is not on birthcontrol and is sexually active. LNMP 02/04/2014   Past Medical History  Diagnosis Date  . Supervision of normal pregnancy 09/14/2011    [facility] (transfer from [place]) Genetic Screen  Reports negative  Anatomic [place]  Normal @ 20 wks; poor visual of spine>rescan nml  Glucose Screen 150 >> needs 3 hr GTT  GBS   Feeding Preference  Breast  Contraception   Circumcision       . Colitis    Past Surgical History  Procedure Laterality Date  . 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 Cesarean section  01/30/2012    Procedure: CESAREAN SECTION;  Surgeon: Jonnie Kind, MD;  Location: [facility];  Service: Gynecology;  Laterality: N/A;  Primary Cesarean Section Delivery  Baby Boy @ [phone], Apgars 4/9    Family History  Problem Relation Age of Onset  . Hypertension Mother    History  Substance Use Topics  . Smoking status: Former Research scientist (life sciences)  . Smokeless tobacco: Not on file  . Alcohol Use: No   OB History    Gravida Para Term Preterm AB TAB SAB Ectopic Multiple Living   3 1 1  1     1      Review of Systems  Gastrointestinal: Positive for abdominal pain. Negative for nausea and vomiting.  Genitourinary: Negative for dysuria and frequency.  All other systems reviewed and are negative.    Allergies  Adhesive; Bactrim; and Latex  Home Medications   Prior to Admission medications   Medication Sig Start Date End Date Taking? Authorizing Provider  acetaminophen (TYLENOL) 500 MG tablet Take 1,000 mg by mouth every 6 (six) hours as needed. For leg cramps and headaches    Historical Provider, MD  cetirizine (ZYRTEC) 10 MG tablet Take 10 mg by mouth daily.    Historical Provider, MD  diphenhydrAMINE (BENADRYL) 25 mg capsule Take 25 mg by mouth as needed.    Historical Provider, MD  ibuprofen (ADVIL,MOTRIN) 800 MG tablet Take 800 mg by mouth every 8 (eight) hours as needed.    Historical Provider, MD  naproxen (NAPROSYN) 500 MG tablet Take 1 tablet (500 mg total) by mouth 2 (two) times daily. 09/15/13  Hope Bunnie Pion, NP  Prenatal Vit-Fe Fumarate-FA (MULTIVITAMIN-PRENATAL) 27-0.8 MG TABS Take 1 tablet by mouth daily.    Historical Provider, MD   Triage Vitals: BP 118/84 mmHg  Pulse 88  Temp(Src) 98.2 F (36.8 C) (Oral)  Resp 16  Ht 5\' 5"  (1.651 m)  Wt 174 lb (78.926 kg)  BMI 28.96 kg/m2  SpO2 98%  LMP 02/04/2014  Physical Exam  Constitutional: She is oriented to person, place, and time. She appears well-developed and well-nourished. No distress.  HENT:  Head: Normocephalic and atraumatic.  Right Ear: External ear normal.  Left Ear: External ear normal.  Nose: Nose normal.  Mouth/Throat: Oropharynx is clear and moist.  Eyes: Conjunctivae and EOM are  normal. Pupils are equal, round, and reactive to light.  Neck: Normal range of motion. Neck supple. No tracheal deviation present.  Cardiovascular: Normal rate, regular rhythm, normal heart sounds and intact distal pulses.   Pulmonary/Chest: Effort normal and breath sounds normal. No respiratory distress. She has no wheezes.  Abdominal: Soft. Bowel sounds are normal. She exhibits no distension. There is no tenderness. There is no rebound and no guarding.  Musculoskeletal: Normal range of motion.  Neurological: She is alert and oriented to person, place, and time.  Skin: Skin is warm and dry.  Psychiatric: She has a normal mood and affect. Her behavior is normal. Thought content normal.  Nursing note and vitals reviewed.   ED Course  Procedures (including critical care time) DIAGNOSTIC STUDIES: Oxygen Saturation is 98% on RA, normal by my interpretation.    COORDINATION OF CARE: 10:43 PM-Discussed treatment plan with pt at bedside and pt agreed to plan.     Labs Review Labs Reviewed  URINALYSIS, ROUTINE W REFLEX MICROSCOPIC - Abnormal; Notable for the following:    Hgb urine dipstick TRACE (*)    All other components within normal limits  PREGNANCY, URINE  URINE MICROSCOPIC-ADD ON    Imaging Review No results found.   EKG Interpretation None      MDM   Final diagnoses:  Microscopic hematuria       I personally performed the services described in this documentation, which was scribed in my presence. The recorded information has been reviewed and considered.   Shaune Pollack, MD 03/03/14 [phone]

## 2014-02-28 NOTE — ED Notes (Signed)
abd pain x 1.5 weeks  Off and on worse last 2 days,  Feels bloated,  Denies urinary sx

## 2014-07-06 ENCOUNTER — Encounter: Payer: Self-pay | Admitting: Obstetrics and Gynecology

## 2014-07-06 ENCOUNTER — Encounter: Payer: Self-pay | Admitting: *Deleted

## 2014-07-06 ENCOUNTER — Ambulatory Visit (INDEPENDENT_AMBULATORY_CARE_PROVIDER_SITE_OTHER): Payer: Medicaid Other | Admitting: Obstetrics and Gynecology

## 2014-07-06 VITALS — BP 122/80 | HR 88 | Wt 172.0 lb

## 2014-07-06 DIAGNOSIS — Z3490 Encounter for supervision of normal pregnancy, unspecified, unspecified trimester: Secondary | ICD-10-CM | POA: Diagnosis not present

## 2014-07-06 DIAGNOSIS — O34219 Maternal care for unspecified type scar from previous cesarean delivery: Secondary | ICD-10-CM

## 2014-07-06 DIAGNOSIS — O3421 Maternal care for scar from previous cesarean delivery: Secondary | ICD-10-CM

## 2014-07-06 DIAGNOSIS — Z8659 Personal history of other mental and behavioral disorders: Secondary | ICD-10-CM

## 2014-07-06 DIAGNOSIS — F418 Other specified anxiety disorders: Secondary | ICD-10-CM | POA: Insufficient documentation

## 2014-07-06 DIAGNOSIS — Z3492 Encounter for supervision of normal pregnancy, unspecified, second trimester: Secondary | ICD-10-CM | POA: Diagnosis not present

## 2014-07-06 DIAGNOSIS — O9989 Other specified diseases and conditions complicating pregnancy, childbirth and the puerperium: Secondary | ICD-10-CM

## 2014-07-06 DIAGNOSIS — Z8759 Personal history of other complications of pregnancy, childbirth and the puerperium: Secondary | ICD-10-CM

## 2014-07-06 HISTORY — DX: Personal history of other complications of pregnancy, childbirth and the puerperium: Z87.59

## 2014-07-06 HISTORY — DX: Personal history of other mental and behavioral disorders: Z86.59

## 2014-07-06 NOTE — Progress Notes (Signed)
Subjective:    Stephanie Hopkins is a [id number] [redacted]w[redacted]d by LMP confirmed by office scan today being seen today for her first obstetrical visit. Her obstetrical history is significant for previous C/S after pushing x 4hs. (8#5)  Undecided re TOLAC. Marland Kitchen Patient does intend to breast feed. Pregnancy history fully reviewed. Hx C/S wound separation and PP depression  Patient reports  Has had cramping for days and breasts are tender and lumpy. Urinary frequency but denies dysuria or urgency.  Declines Pap (normal 6/15) but OK with cx check today.   Filed Vitals:   07/06/14 0937  BP: 122/80  Pulse: 88  Weight: 172 lb (78.019 kg)    HISTORY: OB History  Gravida Para Term Preterm AB SAB TAB Ectopic Multiple Living  4 1 1  1     1     # Outcome Date GA Lbr Len/2nd Weight Sex Delivery Anes PTL Lv  4 Current           3 Term 01/30/12 [redacted]w[redacted]d 04:19 / 04:36 8 lb 4.5 oz (3.755 kg) M CS-LTranv EPI  Y  2 AB 09/03/10 [redacted]w[redacted]d   U    N  1 Gravida              Comments: System Generated. Please review and update pregnancy details.     Past Medical History  Diagnosis Date  . Supervision of normal pregnancy 09/14/2011    [facility] (transfer from [place]) Genetic Screen  Reports negative  Anatomic [place]  Normal @ 20 wks; poor visual of spine>rescan nml  Glucose Screen 150 >> needs 3 hr GTT  GBS   Feeding Preference  Breast  Contraception   Circumcision       . Colitis    Past Surgical History  Procedure Laterality Date  . 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 Cesarean section  01/30/2012    Procedure: CESAREAN SECTION;  Surgeon: Jonnie Kind, MD;  Location: [facility];  Service: Gynecology;  Laterality: N/A;  Primary Cesarean Section Delivery Baby Boy @ [phone], Apgars 4/9    Family History  Problem Relation Age of Onset  . Hypertension Mother      Exam    Uterus:     Pelvic Exam:    Perineum: No Hemorrhoids, Normal Perineum   Vulva: normal, Bartholin's, Urethra,  Skene's normal   Vagina:  normal mucosa, normal discharge       Cervix: Posterior, long, closed   Adnexa: not evaluated   Bony Pelvis: average  System: Breast:  normal appearance, no masses or tenderness   Skin: normal coloration and turgor, no rashes    Neurologic: oriented, normal, grossly non-focal   Extremities: normal strength, tone, and muscle mass   HEENT PERRLA, extra ocular movement intact, neck supple with midline trachea and thyroid without masses   Mouth/Teeth mucous membranes moist, pharynx normal without lesions and dental hygiene good   Neck supple   Cardiovascular: regular rate and rhythm, no murmurs or gallops   Respiratory:  appears well, vitals normal, no respiratory distress, acyanotic, normal RR, ear and throat exam is normal, neck free of mass or lymphadenopathy, chest clear, no wheezing, crepitations, rhonchi, normal symmetric air entry   Abdomen: soft, non-tender; bowel sounds normal; no masses,  no organomegaly   Urinary: urethral meatus normal      Assessment:    Pregnancy: [id number] Patient Active Problem List   Diagnosis Date Noted  . Previous  cesarean delivery affecting pregnancy, antepartum 08/10/2013        Plan:     Initial labs drawn. Prenatal vitamins. Problem list reviewed and updated. Genetic Screening discussed Quad Screen: declined.  Ultrasound discussed; fetal survey: requested. Lora to schedule  Follow up in 4 weeks. 50% of 30 min visit spent on counseling and coordination of care.     Dajaun Goldring 07/06/2014

## 2014-07-06 NOTE — Patient Instructions (Signed)
Second Trimester of Pregnancy The second trimester is from week 13 through week 28, months 4 through 6. The second trimester is often a time when you feel your best. Your body has also adjusted to being pregnant, and you begin to feel better physically. Usually, morning sickness has lessened or quit completely, you may have more energy, and you may have an increase in appetite. The second trimester is also a time when the fetus is growing rapidly. At the end of the sixth month, the fetus is about 9 inches long and weighs about 1 pounds. You will likely begin to feel the baby move (quickening) between 18 and 20 weeks of the pregnancy. BODY CHANGES Your body goes through many changes during pregnancy. The changes vary from woman to woman.   Your weight will continue to increase. You will notice your lower abdomen bulging out.  You may begin to get stretch marks on your hips, abdomen, and breasts.  You may develop headaches that can be relieved by medicines approved by your health care provider.  You may urinate more often because the fetus is pressing on your bladder.  You may develop or continue to have heartburn as a result of your pregnancy.  You may develop constipation because certain hormones are causing the muscles that push waste through your intestines to slow down.  You may develop hemorrhoids or swollen, bulging veins (varicose veins).  You may have back pain because of the weight gain and pregnancy hormones relaxing your joints between the bones in your pelvis and as a result of a shift in weight and the muscles that support your balance.  Your breasts will continue to grow and be tender.  Your gums may bleed and may be sensitive to brushing and flossing.  Dark spots or blotches (chloasma, mask of pregnancy) may develop on your face. This will likely fade after the baby is born.  A dark line from your belly button to the pubic area (linea nigra) may appear. This will likely fade  after the baby is born.  You may have changes in your hair. These can include thickening of your hair, rapid growth, and changes in texture. Some women also have hair loss during or after pregnancy, or hair that feels dry or thin. Your hair will most likely return to normal after your baby is born. WHAT TO EXPECT AT YOUR PRENATAL VISITS During a routine prenatal visit:  You will be weighed to make sure you and the fetus are growing normally.  Your blood pressure will be taken.  Your abdomen will be measured to track your baby's growth.  The fetal heartbeat will be listened to.  Any test results from the previous visit will be discussed. Your health care provider may ask you:  How you are feeling.  If you are feeling the baby move.  If you have had any abnormal symptoms, such as leaking fluid, bleeding, severe headaches, or abdominal cramping.  If you have any questions. Other tests that may be performed during your second trimester include:  Blood tests that check for:  Low iron levels (anemia).  Gestational diabetes (between 24 and 28 weeks).  Rh antibodies.  Urine tests to check for infections, diabetes, or protein in the urine.  An ultrasound to confirm the proper growth and development of the baby.  An amniocentesis to check for possible genetic problems.  Fetal screens for spina bifida and Down syndrome. HOME CARE INSTRUCTIONS   Avoid all smoking, herbs, alcohol, and unprescribed   drugs. These chemicals affect the formation and growth of the baby.  Follow your health care provider's instructions regarding medicine use. There are medicines that are either safe or unsafe to take during pregnancy.  Exercise only as directed by your health care provider. Experiencing uterine cramps is a good sign to stop exercising.  Continue to eat regular, healthy meals.  Wear a good support bra for breast tenderness.  Do not use hot tubs, steam rooms, or saunas.  Wear your  seat belt at all times when driving.  Avoid raw meat, uncooked cheese, cat litter boxes, and soil used by cats. These carry germs that can cause birth defects in the baby.  Take your prenatal vitamins.  Try taking a stool softener (if your health care provider approves) if you develop constipation. Eat more high-fiber foods, such as fresh vegetables or fruit and whole grains. Drink plenty of fluids to keep your urine clear or pale yellow.  Take warm sitz baths to soothe any pain or discomfort caused by hemorrhoids. Use hemorrhoid cream if your health care provider approves.  If you develop varicose veins, wear support hose. Elevate your feet for 15 minutes, 3-4 times a day. Limit salt in your diet.  Avoid heavy lifting, wear low heel shoes, and practice good posture.  Rest with your legs elevated if you have leg cramps or low back pain.  Visit your dentist if you have not gone yet during your pregnancy. Use a soft toothbrush to brush your teeth and be gentle when you floss.  A sexual relationship may be continued unless your health care provider directs you otherwise.  Continue to go to all your prenatal visits as directed by your health care provider. SEEK MEDICAL CARE IF:   You have dizziness.  You have mild pelvic cramps, pelvic pressure, or nagging pain in the abdominal area.  You have persistent nausea, vomiting, or diarrhea.  You have a bad smelling vaginal discharge.  You have pain with urination. SEEK IMMEDIATE MEDICAL CARE IF:   You have a fever.  You are leaking fluid from your vagina.  You have spotting or bleeding from your vagina.  You have severe abdominal cramping or pain.  You have rapid weight gain or loss.  You have shortness of breath with chest pain.  You notice sudden or extreme swelling of your face, hands, ankles, feet, or legs.  You have not felt your baby move in over an hour.  You have severe headaches that do not go away with  medicine.  You have vision changes. Document Released: 02/24/2001 Document Revised: 03/07/2013 Document Reviewed: 05/03/2012 [employer] Patient Information 2015 [employer]. This information is not intended to replace advice given to you by your health care provider. Make sure you discuss any questions you have with your health care provider.  

## 2014-07-06 NOTE — Progress Notes (Signed)
Pt here for initial prenatal visit. Normal PAP 09/2013. Bedside US shows SIUP with FHR 143 and HC measuring GA [redacted]w[redacted]d

## 2014-07-09 LAB — PRENATAL PROFILE (SOLSTAS)
Antibody Screen: NEGATIVE
BASOS ABS: 0 10*3/uL (ref 0.0–0.1)
BASOS PCT: 0 % (ref 0–1)
Eosinophils Absolute: 0.1 10*3/uL (ref 0.0–0.7)
Eosinophils Relative: 2 % (ref 0–5)
HCT: 35.3 % — ABNORMAL LOW (ref 36.0–46.0)
HEP B S AG: NEGATIVE
HIV: NONREACTIVE
Hemoglobin: 12.2 g/dL (ref 12.0–15.0)
Lymphocytes Relative: 27 % (ref 12–46)
Lymphs Abs: 1.7 10*3/uL (ref 0.7–4.0)
MCH: 30 pg (ref 26.0–34.0)
MCHC: 34.6 g/dL (ref 30.0–36.0)
MCV: 86.7 fL (ref 78.0–100.0)
MPV: 9.4 fL (ref 8.6–12.4)
Monocytes Absolute: 0.3 10*3/uL (ref 0.1–1.0)
Monocytes Relative: 4 % (ref 3–12)
NEUTROS ABS: 4.2 10*3/uL (ref 1.7–7.7)
NEUTROS PCT: 67 % (ref 43–77)
PLATELETS: 261 10*3/uL (ref 150–400)
RBC: 4.07 MIL/uL (ref 3.87–5.11)
RDW: 13.7 % (ref 11.5–15.5)
RH TYPE: POSITIVE
Rubella: 1.41 Index — ABNORMAL HIGH (ref <0.90)
WBC: 6.3 10*3/uL (ref 4.0–10.5)

## 2014-07-16 ENCOUNTER — Ambulatory Visit (HOSPITAL_COMMUNITY)
Admission: RE | Admit: 2014-07-16 | Discharge: 2014-07-16 | Disposition: A | Discharge status: Home / Self Care | Payer: Medicaid Other | Source: Ambulatory Visit | Attending: Obstetrics and Gynecology | Admitting: Obstetrics and Gynecology

## 2014-07-16 DIAGNOSIS — Z3689 Encounter for other specified antenatal screening: Secondary | ICD-10-CM | POA: Insufficient documentation

## 2014-07-16 DIAGNOSIS — O34219 Maternal care for unspecified type scar from previous cesarean delivery: Secondary | ICD-10-CM

## 2014-07-16 DIAGNOSIS — O3421 Maternal care for scar from previous cesarean delivery: Secondary | ICD-10-CM | POA: Insufficient documentation

## 2014-07-16 DIAGNOSIS — Z36 Encounter for antenatal screening of mother: Secondary | ICD-10-CM | POA: Insufficient documentation

## 2014-07-16 DIAGNOSIS — Z3492 Encounter for supervision of normal pregnancy, unspecified, second trimester: Secondary | ICD-10-CM

## 2014-07-16 DIAGNOSIS — Z3A19 19 weeks gestation of pregnancy: Secondary | ICD-10-CM | POA: Diagnosis not present

## 2014-07-23 ENCOUNTER — Emergency Department (HOSPITAL_BASED_OUTPATIENT_CLINIC_OR_DEPARTMENT_OTHER)
Admission: EM | Admit: 2014-07-23 | Discharge: 2014-07-23 | Disposition: A | Discharge status: Home / Self Care | Payer: Medicaid Other | Attending: Emergency Medicine | Admitting: Emergency Medicine

## 2014-07-23 ENCOUNTER — Encounter (HOSPITAL_BASED_OUTPATIENT_CLINIC_OR_DEPARTMENT_OTHER): Payer: Self-pay | Admitting: *Deleted

## 2014-07-23 DIAGNOSIS — R300 Dysuria: Secondary | ICD-10-CM | POA: Insufficient documentation

## 2014-07-23 DIAGNOSIS — Z7952 Long term (current) use of systemic steroids: Secondary | ICD-10-CM | POA: Insufficient documentation

## 2014-07-23 DIAGNOSIS — R21 Rash and other nonspecific skin eruption: Secondary | ICD-10-CM | POA: Insufficient documentation

## 2014-07-23 DIAGNOSIS — Z87891 Personal history of nicotine dependence: Secondary | ICD-10-CM | POA: Insufficient documentation

## 2014-07-23 DIAGNOSIS — Z91040 Latex allergy status: Secondary | ICD-10-CM | POA: Diagnosis not present

## 2014-07-23 DIAGNOSIS — L282 Other prurigo: Secondary | ICD-10-CM

## 2014-07-23 LAB — URINALYSIS, ROUTINE W REFLEX MICROSCOPIC
Bilirubin Urine: NEGATIVE
Glucose, UA: NEGATIVE mg/dL
Hgb urine dipstick: NEGATIVE
Ketones, ur: NEGATIVE mg/dL
Leukocytes, UA: NEGATIVE
Nitrite: NEGATIVE
Protein, ur: NEGATIVE mg/dL
Specific Gravity, Urine: 1.002 — ABNORMAL LOW (ref 1.005–1.030)
Urobilinogen, UA: 0.2 mg/dL (ref 0.0–1.0)
pH: 7 (ref 5.0–8.0)

## 2014-07-23 MED ORDER — DIPHENHYDRAMINE HCL 25 MG PO TABS: 25.0000 mg | ORAL_TABLET | Freq: Four times a day (QID) | ORAL | Status: DC

## 2014-07-23 MED ORDER — HYDROCORTISONE 1 % EX CREA: TOPICAL_CREAM | CUTANEOUS | Status: DC

## 2014-07-23 NOTE — ED Provider Notes (Signed)
CSN: [id number]     Arrival date & time 07/23/14  2055 History   First MD Initiated Contact with Patient 07/23/14 2215     Chief Complaint  Patient presents with  . Rash     (Consider location/radiation/quality/duration/timing/severity/associated sxs/prior Treatment) HPI Pt is a 29yo female [redacted] weeks pregnant, presenting to ED with c/o gradually worsening diffuse pruritic rash over entire body.  Pt reports taking Claritin w/o relief. States the itch is worse at night and it has been so bad she has caused scratches on her skin.  Mother with rash but not the same kind. Pt states mother's rash is on wrists and neck but does not look similar. No one in house with rash. Denies new soaps, lotions, foods or medications. No known allergies. Denies fever, chills, n/v/d.   Past Medical History  Diagnosis Date  . Supervision of normal pregnancy 09/14/2011    [facility] (transfer from [facility]) Genetic Screen  Reports negative  Anatomic [place]  Normal @ 20 wks; poor visual of spine>rescan nml  Glucose Screen 150 >> needs 3 hr GTT  GBS   Feeding Preference  Breast  Contraception   Circumcision       . Colitis    Past Surgical History  Procedure Laterality Date  . 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 Cesarean section  01/30/2012    Procedure: CESAREAN SECTION;  Surgeon: Jonnie Kind, MD;  Location: [facility];  Service: Gynecology;  Laterality: N/A;  Primary Cesarean Section Delivery Baby Boy @ [phone], Apgars 4/9    Family History  Problem Relation Age of Onset  . Hypertension Mother    History  Substance Use Topics  . Smoking status: Former Research scientist (life sciences)  . Smokeless tobacco: Not on file  . Alcohol Use: No   OB History    Gravida Para Term Preterm AB TAB SAB Ectopic Multiple Living   4 1 1  2  1   1      Review of Systems  Constitutional: Negative for fever and chills.  Gastrointestinal: Negative for nausea, vomiting and abdominal pain.  Musculoskeletal:  Negative for myalgias and arthralgias.  Skin: Positive for rash and wound ( from scratching rash). Negative for color change.  All other systems reviewed and are negative.     Allergies  Adhesive; Bactrim; and Latex  Home Medications   Prior to Admission medications   Medication Sig Start Date End Date Taking? Authorizing Provider  acetaminophen (TYLENOL) 500 MG tablet Take 1,000 mg by mouth every 6 (six) hours as needed. For leg cramps and headaches    Historical Provider, MD  cetirizine (ZYRTEC) 10 MG tablet Take 10 mg by mouth daily.    Historical Provider, MD  diphenhydrAMINE (BENADRYL) 25 MG tablet Take 1 tablet (25 mg total) by mouth every 6 (six) hours. 07/23/14   Noland Fordyce, PA-C  hydrocortisone cream 1 % Apply to affected area 2 times daily 07/23/14   Noland Fordyce, PA-C  Prenatal Vit-Fe Fumarate-FA (MULTIVITAMIN-PRENATAL) 27-0.8 MG TABS Take 1 tablet by mouth daily.    Historical Provider, MD   BP 120/73 mmHg  Pulse 92  Temp(Src) 98.4 F (36.9 C) (Oral)  Resp 18  Ht 5\' 5"  (1.651 m)  Wt 172 lb (78.019 kg)  BMI 28.62 kg/m2  SpO2 100%  LMP 03/05/2014 Physical Exam  Constitutional: She appears well-developed and well-nourished. No distress.  HENT:  Head: Normocephalic and atraumatic.  Eyes: Conjunctivae are normal. No  scleral icterus.  Neck: Normal range of motion.  Cardiovascular: Normal rate, regular rhythm and normal heart sounds.   Pulmonary/Chest: Effort normal and breath sounds normal. No respiratory distress. She has no wheezes. She has no rales. She exhibits no tenderness.  Abdominal: Soft. Bowel sounds are normal. She exhibits no distension and no mass. There is no tenderness. There is no rebound and no guarding.  Musculoskeletal: Normal range of motion.  Neurological: She is alert.  Skin: Skin is warm and dry. Rash noted. No ecchymosis, no petechiae and no purpura noted. She is not diaphoretic. There is erythema.  Diffuse erythematous papular rash on arms,  legs, trunk, back, and neck. Multiple areas of excoriation. No evidence of underlying infection. Rash does not involve palms of hands or soles of feet  Nursing note and vitals reviewed.   ED Course  Procedures (including critical care time) Labs Review Labs Reviewed  URINALYSIS, ROUTINE W REFLEX MICROSCOPIC - Abnormal; Notable for the following:    Specific Gravity, Urine 1.002 (*)    All other components within normal limits    Imaging Review No results found.   EKG Interpretation None      MDM   Final diagnoses:  Pruritic rash  Dysuria    Pt presenting to ED with diffuse pruritic rash with areas of excoriation. No evidence of underlying infection. Rash Not c/w scabies as no family members in house with rash and rash on hands or feet in web spaces.  Rash is nondescript. Pt is afebrile. Will tx with benadryl and hydrocortisone cream.  Discussed pt with Dr. Canary Brim who also examined pt. Agrees with tx plan. Will have pt f/u with PCP as well as OB/GYN as rash may be related to pt's pregnancy. Home care instructions provided. Return precautions provided. Pt verbalized understanding and agreement with tx plan.   11:02 PM at discharge, pt states she is having burning with urination and would like to have it evaluated. Will send UA UA: unremarkable. Will tx as planned with benadryl and hydrocortisone cream.    Noland Fordyce, PA-C 07/24/14 1191  Alfonzo Beers, MD 07/25/14 [phone]

## 2014-07-23 NOTE — ED Notes (Signed)
Rash over entire body x 1 week.  Itching, denies pain.

## 2014-07-23 NOTE — Discharge Instructions (Signed)
Rash A rash is a change in the color or texture of your skin. There are many different types of rashes. You may have other problems that accompany your rash. CAUSES   Infections.  Allergic reactions. This can include allergies to pets or foods.  Certain medicines.  Exposure to certain chemicals, soaps, or cosmetics.  Heat.  Exposure to poisonous plants.  Tumors, both cancerous and noncancerous. SYMPTOMS   Redness.  Scaly skin.  Itchy skin.  Dry or cracked skin.  Bumps.  Blisters.  Pain. DIAGNOSIS  Your caregiver may do a physical exam to determine what type of rash you have. A skin sample (biopsy) may be taken and examined under a microscope. TREATMENT  Treatment depends on the type of rash you have. Your caregiver may prescribe certain medicines. For serious conditions, you may need to see a skin doctor (dermatologist). HOME CARE INSTRUCTIONS   Avoid the substance that caused your rash.  Do not scratch your rash. This can cause infection.  You may take cool baths to help stop itching.  Only take over-the-counter or prescription medicines as directed by your caregiver.  Keep all follow-up appointments as directed by your caregiver. SEEK IMMEDIATE MEDICAL CARE IF:  You have increasing pain, swelling, or redness.  You have a fever.  You have new or severe symptoms.  You have body aches, diarrhea, or vomiting.  Your rash is not better after 3 days. MAKE SURE YOU:  Understand these instructions.  Will watch your condition.  Will get help right away if you are not doing well or get worse. Document Released: 02/20/2002 Document Revised: 05/25/2011 Document Reviewed: 12/15/2010 [facility] Patient Information 2015 [place], [place]. This information is not intended to replace advice given to you by your health care provider. Make sure you discuss any questions you have with your health care provider.  Pruritus  Pruritus is an itch. There are many different  problems that can cause an itch. Dry skin is one of the most common causes of itching. Most cases of itching do not require medical attention.  HOME CARE INSTRUCTIONS  Make sure your skin is moistened on a regular basis. A moisturizer that contains petroleum jelly is best for keeping moisture in your skin. If you develop a rash, you may try the following for relief:   Use corticosteroid cream.  Apply cool compresses to the affected areas.  Bathe with Epsom salts or baking soda in the bathwater.  Soak in colloidal oatmeal baths. These are available at your pharmacy.  Apply baking soda paste to the rash. Stir water into baking soda until it reaches a paste-like consistency.  Use an anti-itch lotion.  Take over-the-counter diphenhydramine medicine by mouth as the instructions direct.  Avoid scratching. Scratching may cause the rash to become infected. If itching is very bad, your caregiver may suggest prescription lotions or creams to lessen your symptoms.  Avoid hot showers, which can make itching worse. A cold shower may help with itching as long as you use a moisturizer after the shower. SEEK MEDICAL CARE IF: The itching does not go away after several days. Document Released: 11/12/2010 Document Revised: 07/17/2013 Document Reviewed: 11/12/2010 [facility] Patient Information 2015 [place], [place]. This information is not intended to replace advice given to you by your health care provider. Make sure you discuss any questions you have with your health care provider.  Skin Conditions During Pregnancy Pregnancy affects many parts of your body. One part is your skin. Most skin problems that develop during pregnancy  are not serious and are considered a normal part of pregnancy. They go away on their own after the baby is born. Other skin problems may need treatment.  WHAT TYPE OF SKIN PROBLEMS CAN DEVELOP DURING PREGNANCY?  Stretch marks. Stretch marks are purple or pink lines on the skin. They  may appear on the belly, breasts, thighs, or buttocks. Stretch marks are caused by weight gain that causes the skin to stretch. Stretch marks do not cause problems. Almost all women get them during pregnancy.  Darkening of the skin (hyperpigmentation). The darkening may occur in patches or as a line. Patches may appear on the face, nipples, or genital area. Lines often stretch from the belly button to the pubic area. Hyperpigmentation develops in almost all pregnant women. It is more severe in women with a dark complexion.  Spider angiomas. These are tiny pink or red lines that go out from a center point, like the legs of a spider. Usually, they are on the face, neck, and arms. They do not cause problems. They are most common in women with light complexions.  Palmar erythema. This is a reddening of the palms. It is most common in women with light complexions.  Swelling and redness. This can occur on the face, eyelids, fingers, or toes.  Pruritic urticarial papules and plaques of pregnancy (PUPPP). This is a rash that is itchy, red, and has tiny blisters. The cause is unknown. It usually starts on the abdomen and may affect the arms or legs. It does not affect the face. It usually begins later in pregnancy. About a third of all pregnant women develop this condition. There are no associated problems to the fetus with this rash. Sometimes, oral steroids are used to calm down the itch. The rash clears after the baby is born.  Prurigo of pregnancy. This is a disease in which red patches and bumps appear on the arms and legs. The cause is unknown. The patches and bumps clear after the baby is born. About a third of pregnant women develop this disease.  Acne. Pimples may develop, including in women who have had clear skin for a long time.  Skin tags. These are small flaps of skin that stick out from the body. They may grow or become darker during pregnancy. They are usually harmless.  Moles. These are  flat or slightly raised growths. They are usually round and pink or brown. They may grow or become darker during pregnancy.  Intrahepatic cholestasis of pregnancy. This is a rare condition that causes itchy skin. It may run in families. It increases the risk of complications for the fetus. This condition usually resolves after delivery. It can recur with subsequent pregnancies.  Impetigo herpetiformis. This is a form of a severe skin disease called pustular psoriasis. Usually, delivery is the only method of resolving the condition.  Pruritic folliculitis of pregnancy. This is a rare condition that causes pimple-like skin growths. It develops in the middle or later stages of pregnancy. Its cause is unknown.It usually resolves 2-3 weeks after delivery.  Pemphigoid gestationis. This is a very rare autoimmune disease. It causes a severely itchy rash and blisters. The rash does not appear on the face, scalp, or inside of the mouth. It usually resolves 3 months after delivery. It may recur with subsequent pregnancies. Some pre-existing skin conditions, such as atopic dermatitis, may become worse during pregnancy.  HOME CARE INSTRUCTIONS  Different conditions may have different instructions. In general:  Follow all your health care  provider's directions about medicines to treat skin problems while you are pregnant. Do not use any over-the-counter medicines (including medicated creams and lotions) until you have checked with your health care provider. Many medicines are not safe to use when you are pregnant.  Avoid time in the sun. This will help keep your skin from darkening. When you must be outside, use sunscreen and wear a hat with a wide brim to protect your face. The sunscreen should have a SPF of at least 62. This may help limit dark spots that develop when the skin is exposed to the sun.  To avoid problems from stretched skin:  Do not sit or stand for long periods of time.  Exercise regularly.  This helps keep your skin in good condition.  Use a gentle soap. This helps prevent acne.  Do not get too hot or too sweaty. This makes some skin rashes worse.  Wear loose clothes made of a soft fabric. This prevents skin irritation.  For itching, add oatmeal or cornstarch to your bathwater.  Use a skin moisturizer. Ask your health care provider for suggestions. Document Released: 04/04/2010 Document Revised: 07/17/2013 Document Reviewed: 12/12/2012 [facility] Patient Information 2015 [place], [place]. This information is not intended to replace advice given to you by your health care provider. Make sure you discuss any questions you have with your health care provider.  Dysuria Dysuria is the medical term for pain with urination. There are many causes for dysuria, but urinary tract infection is the most common. If a urinalysis was performed it can show that there is a urinary tract infection. A urine culture confirms that you or your child is sick. You will need to follow up with a healthcare provider because:  If a urine culture was done you will need to know the culture results and treatment recommendations.  If the urine culture was positive, you or your child will need to be put on antibiotics or know if the antibiotics prescribed are the right antibiotics for your urinary tract infection.  If the urine culture is negative (no urinary tract infection), then other causes may need to be explored or antibiotics need to be stopped. Today laboratory work may have been done and there does not seem to be an infection. If cultures were done they will take at least 24 to 48 hours to be completed. Today x-rays may have been taken and they read as normal. No cause can be found for the problems. The x-rays may be re-read by a radiologist and you will be contacted if additional findings are made. You or your child may have been put on medications to help with this problem until you can see your primary  caregiver. If the problems get better, see your primary caregiver if the problems return. If you were given antibiotics (medications which kill germs), take all of the mediations as directed for the full course of treatment.  If laboratory work was done, you need to find the results. Leave a telephone number where you can be reached. If this is not possible, make sure you find out how you are to get test results. HOME CARE INSTRUCTIONS   Drink lots of fluids. For adults, drink eight, 8 ounce glasses of clear juice or water a day. For children, replace fluids as suggested by your caregiver.  Empty the bladder often. Avoid holding urine for long periods of time.  After a bowel movement, women should cleanse front to back, using each tissue only once.  Empty your bladder before and after sexual intercourse.  Take all the medicine given to you until it is gone. You may feel better in a few days, but TAKE ALL MEDICINE.  Avoid caffeine, tea, alcohol and carbonated beverages, because they tend to irritate the bladder.  In men, alcohol may irritate the prostate.  Only take over-the-counter or prescription medicines for pain, discomfort, or fever as directed by your caregiver.  If your caregiver has given you a follow-up appointment, it is very important to keep that appointment. Not keeping the appointment could result in a chronic or permanent injury, pain, and disability. If there is any problem keeping the appointment, you must call back to this facility for assistance. SEEK IMMEDIATE MEDICAL CARE IF:   Back pain develops.  A fever develops.  There is nausea (feeling sick to your stomach) or vomiting (throwing up).  Problems are no better with medications or are getting worse. MAKE SURE YOU:   Understand these instructions.  Will watch your condition.  Will get help right away if you are not doing well or get worse. Document Released: 11/29/2003 Document Revised: 05/25/2011 Document  Reviewed: 10/06/2007 [facility] Patient Information 2015 [place], [place]. This information is not intended to replace advice given to you by your health care provider. Make sure you discuss any questions you have with your health care provider.

## 2014-07-26 ENCOUNTER — Encounter: Payer: Medicaid Other | Admitting: Family Medicine

## 2014-07-31 ENCOUNTER — Encounter: Payer: Medicaid Other | Admitting: Obstetrics & Gynecology

## 2014-08-03 ENCOUNTER — Ambulatory Visit (INDEPENDENT_AMBULATORY_CARE_PROVIDER_SITE_OTHER): Payer: Medicaid Other | Admitting: Certified Nurse Midwife

## 2014-08-03 VITALS — BP 116/70 | HR 79 | Wt 176.0 lb

## 2014-08-03 DIAGNOSIS — Z3482 Encounter for supervision of other normal pregnancy, second trimester: Secondary | ICD-10-CM

## 2014-08-03 DIAGNOSIS — N898 Other specified noninflammatory disorders of vagina: Secondary | ICD-10-CM

## 2014-08-03 DIAGNOSIS — Z3492 Encounter for supervision of normal pregnancy, unspecified, second trimester: Secondary | ICD-10-CM

## 2014-08-03 NOTE — Progress Notes (Signed)
Pt is still complaining of itching. Small red bumps noted on outer thighs and back. Pt states her whole family was treated for scabies last week but there was never a confirmed diagnosis. Not typical presentation for scabies. Will order bile acids, cmp,cbc. Pt c/o greenish discharge. WEt prep collected. Gc/Ch of off urine. Pt states she desires repeat C/S.

## 2014-08-03 NOTE — Progress Notes (Signed)
Pt states she has anterior placenta which prevents her from feeling a lot of fetal movement. She also c/o increased abd cramping, contractions, and green vaginal discharge which started this week. Pt would like to be checked for vaginal infections.

## 2014-08-03 NOTE — Patient Instructions (Signed)
Second Trimester of Pregnancy The second trimester is from week 13 through week 28, months 4 through 6. The second trimester is often a time when you feel your best. Your body has also adjusted to being pregnant, and you begin to feel better physically. Usually, morning sickness has lessened or quit completely, you may have more energy, and you may have an increase in appetite. The second trimester is also a time when the fetus is growing rapidly. At the end of the sixth month, the fetus is about 9 inches long and weighs about 1 pounds. You will likely begin to feel the baby move (quickening) between 18 and 20 weeks of the pregnancy. BODY CHANGES Your body goes through many changes during pregnancy. The changes vary from woman to woman.   Your weight will continue to increase. You will notice your lower abdomen bulging out.  You may begin to get stretch marks on your hips, abdomen, and breasts.  You may develop headaches that can be relieved by medicines approved by your health care provider.  You may urinate more often because the fetus is pressing on your bladder.  You may develop or continue to have heartburn as a result of your pregnancy.  You may develop constipation because certain hormones are causing the muscles that push waste through your intestines to slow down.  You may develop hemorrhoids or swollen, bulging veins (varicose veins).  You may have back pain because of the weight gain and pregnancy hormones relaxing your joints between the bones in your pelvis and as a result of a shift in weight and the muscles that support your balance.  Your breasts will continue to grow and be tender.  Your gums may bleed and may be sensitive to brushing and flossing.  Dark spots or blotches (chloasma, mask of pregnancy) may develop on your face. This will likely fade after the baby is born.  A dark line from your belly button to the pubic area (linea nigra) may appear. This will likely fade  after the baby is born.  You may have changes in your hair. These can include thickening of your hair, rapid growth, and changes in texture. Some women also have hair loss during or after pregnancy, or hair that feels dry or thin. Your hair will most likely return to normal after your baby is born. WHAT TO EXPECT AT YOUR PRENATAL VISITS During a routine prenatal visit:  You will be weighed to make sure you and the fetus are growing normally.  Your blood pressure will be taken.  Your abdomen will be measured to track your baby's growth.  The fetal heartbeat will be listened to.  Any test results from the previous visit will be discussed. Your health care provider may ask you:  How you are feeling.  If you are feeling the baby move.  If you have had any abnormal symptoms, such as leaking fluid, bleeding, severe headaches, or abdominal cramping.  If you have any questions. Other tests that may be performed during your second trimester include:  Blood tests that check for:  Low iron levels (anemia).  Gestational diabetes (between 24 and 28 weeks).  Rh antibodies.  Urine tests to check for infections, diabetes, or protein in the urine.  An ultrasound to confirm the proper growth and development of the baby.  An amniocentesis to check for possible genetic problems.  Fetal screens for spina bifida and Down syndrome. HOME CARE INSTRUCTIONS   Avoid all smoking, herbs, alcohol, and unprescribed   drugs. These chemicals affect the formation and growth of the baby.  Follow your health care provider's instructions regarding medicine use. There are medicines that are either safe or unsafe to take during pregnancy.  Exercise only as directed by your health care provider. Experiencing uterine cramps is a good sign to stop exercising.  Continue to eat regular, healthy meals.  Wear a good support bra for breast tenderness.  Do not use hot tubs, steam rooms, or saunas.  Wear your  seat belt at all times when driving.  Avoid raw meat, uncooked cheese, cat litter boxes, and soil used by cats. These carry germs that can cause birth defects in the baby.  Take your prenatal vitamins.  Try taking a stool softener (if your health care provider approves) if you develop constipation. Eat more high-fiber foods, such as fresh vegetables or fruit and whole grains. Drink plenty of fluids to keep your urine clear or pale yellow.  Take warm sitz baths to soothe any pain or discomfort caused by hemorrhoids. Use hemorrhoid cream if your health care provider approves.  If you develop varicose veins, wear support hose. Elevate your feet for 15 minutes, 3-4 times a day. Limit salt in your diet.  Avoid heavy lifting, wear low heel shoes, and practice good posture.  Rest with your legs elevated if you have leg cramps or low back pain.  Visit your dentist if you have not gone yet during your pregnancy. Use a soft toothbrush to brush your teeth and be gentle when you floss.  A sexual relationship may be continued unless your health care provider directs you otherwise.  Continue to go to all your prenatal visits as directed by your health care provider. SEEK MEDICAL CARE IF:   You have dizziness.  You have mild pelvic cramps, pelvic pressure, or nagging pain in the abdominal area.  You have persistent nausea, vomiting, or diarrhea.  You have a bad smelling vaginal discharge.  You have pain with urination. SEEK IMMEDIATE MEDICAL CARE IF:   You have a fever.  You are leaking fluid from your vagina.  You have spotting or bleeding from your vagina.  You have severe abdominal cramping or pain.  You have rapid weight gain or loss.  You have shortness of breath with chest pain.  You notice sudden or extreme swelling of your face, hands, ankles, feet, or legs.  You have not felt your baby move in over an hour.  You have severe headaches that do not go away with  medicine.  You have vision changes. Document Released: 02/24/2001 Document Revised: 03/07/2013 Document Reviewed: 05/03/2012 [employer] Patient Information 2015 [employer]. This information is not intended to replace advice given to you by your health care provider. Make sure you discuss any questions you have with your health care provider.  

## 2014-08-03 NOTE — Addendum Note (Signed)
Addended by: Gretchen Short on: 08/03/2014 10:54 AM   Modules accepted: Orders

## 2014-08-04 LAB — WET PREP BY MOLECULAR PROBE
Candida species: NEGATIVE
Gardnerella vaginalis: POSITIVE — AB
Trichomonas vaginosis: NEGATIVE

## 2014-08-04 LAB — GC/CHLAMYDIA PROBE AMP
CT Probe RNA: NEGATIVE
GC Probe RNA: NEGATIVE

## 2014-08-06 ENCOUNTER — Telehealth: Payer: Self-pay | Admitting: *Deleted

## 2014-08-06 DIAGNOSIS — N760 Acute vaginitis: Principal | ICD-10-CM

## 2014-08-06 DIAGNOSIS — B9689 Other specified bacterial agents as the cause of diseases classified elsewhere: Secondary | ICD-10-CM

## 2014-08-06 MED ORDER — METRONIDAZOLE 500 MG PO TABS: 500.0000 mg | ORAL_TABLET | Freq: Two times a day (BID) | ORAL | Status: DC

## 2014-08-06 NOTE — Telephone Encounter (Signed)
Sent Flagyl to pt pharmacy for BV. Explained to pt - Pt expressed understanding.

## 2014-08-09 LAB — COMPREHENSIVE METABOLIC PANEL
ALT: 9 U/L (ref 0–35)
AST: 15 U/L (ref 0–37)
Albumin: 3.5 g/dL (ref 3.5–5.2)
Alkaline Phosphatase: 39 U/L (ref 39–117)
BUN: 8 mg/dL (ref 6–23)
CO2: 22 mEq/L (ref 19–32)
Calcium: 8.3 mg/dL — ABNORMAL LOW (ref 8.4–10.5)
Chloride: 99 mEq/L (ref 96–112)
Creat: 0.48 mg/dL — ABNORMAL LOW (ref 0.50–1.10)
Glucose, Bld: 75 mg/dL (ref 70–99)
Potassium: 4.2 mEq/L (ref 3.5–5.3)
Sodium: 133 mEq/L — ABNORMAL LOW (ref 135–145)
Total Bilirubin: 0.3 mg/dL (ref 0.2–1.2)
Total Protein: 6.8 g/dL (ref 6.0–8.3)

## 2014-08-09 LAB — CBC
HCT: 35.6 % — ABNORMAL LOW (ref 36.0–46.0)
Hemoglobin: 11.9 g/dL — ABNORMAL LOW (ref 12.0–15.0)
MCH: 30.4 pg (ref 26.0–34.0)
MCHC: 33.4 g/dL (ref 30.0–36.0)
MCV: 90.8 fL (ref 78.0–100.0)
MPV: 10 fL (ref 8.6–12.4)
Platelets: 272 10*3/uL (ref 150–400)
RBC: 3.92 MIL/uL (ref 3.87–5.11)
RDW: 13.6 % (ref 11.5–15.5)
WBC: 7.6 10*3/uL (ref 4.0–10.5)

## 2014-08-09 NOTE — Addendum Note (Signed)
Encounter addended by: Lorene Dy, CNM on: 08/09/2014  5:34 PM<BR>     Documentation filed: Problem List, Message

## 2014-08-10 LAB — BILE ACIDS, TOTAL: Bile Acids Total: 5 umol/L (ref 0–19)

## 2014-08-14 ENCOUNTER — Encounter (HOSPITAL_COMMUNITY): Payer: Self-pay | Admitting: *Deleted

## 2014-08-14 ENCOUNTER — Inpatient Hospital Stay (HOSPITAL_COMMUNITY)
Admission: AD | Admit: 2014-08-14 | Discharge: 2014-08-14 | Disposition: A | Discharge status: Home / Self Care | Payer: Medicaid Other | Source: Ambulatory Visit | Attending: Family Medicine | Admitting: Family Medicine

## 2014-08-14 DIAGNOSIS — R102 Pelvic and perineal pain: Secondary | ICD-10-CM | POA: Insufficient documentation

## 2014-08-14 DIAGNOSIS — O26892 Other specified pregnancy related conditions, second trimester: Secondary | ICD-10-CM

## 2014-08-14 DIAGNOSIS — Z87891 Personal history of nicotine dependence: Secondary | ICD-10-CM | POA: Insufficient documentation

## 2014-08-14 DIAGNOSIS — N949 Unspecified condition associated with female genital organs and menstrual cycle: Secondary | ICD-10-CM

## 2014-08-14 DIAGNOSIS — Z3A23 23 weeks gestation of pregnancy: Secondary | ICD-10-CM | POA: Diagnosis not present

## 2014-08-14 DIAGNOSIS — O9989 Other specified diseases and conditions complicating pregnancy, childbirth and the puerperium: Secondary | ICD-10-CM | POA: Diagnosis not present

## 2014-08-14 LAB — URINALYSIS, ROUTINE W REFLEX MICROSCOPIC
Bilirubin Urine: NEGATIVE
GLUCOSE, UA: NEGATIVE mg/dL
Hgb urine dipstick: NEGATIVE
KETONES UR: NEGATIVE mg/dL
Leukocytes, UA: NEGATIVE
Nitrite: NEGATIVE
PROTEIN: NEGATIVE mg/dL
Specific Gravity, Urine: <1.005 — ABNORMAL LOW (ref 1.005–1.030)
Urobilinogen, UA: 0.2 mg/dL (ref 0.0–1.0)
pH: 6 (ref 5.0–8.0)

## 2014-08-14 NOTE — MAU Note (Signed)
Pt states for the last 3 days she has been having a lot of cramping. States she was dx with BV and just finished her antibiotics. Some discomfort with urination and lower abd pressure. The pressure has been ongoing for "a while" but has worsened the last few days.

## 2014-08-14 NOTE — MAU Provider Note (Signed)
History   CSN: [id number]  Arrival date and time: 08/14/14 1916  CC: pelvic pressure  HPI  Patient is 29 y.o. [id number] [redacted]w[redacted]d here with complaints of pelvic pressure for days. States that she feels as though the baby may fall out if she walks ro stands fr too long. Says this pregnancy feels different than her others and she is unable to do as much because of feeling tired. Feels like she cant do anything just needs to lay down. Patient does endorse "menstarual type cramps". they begin in the AM when she wakes up. Does not think they are Braxton-Hicks contractions. They come and go irregularly. Last week patient states she had BV and just completed her course of Flagyl today. Occasionally gets spec of blood with intercourse or when uses bathroom. Attributes the blood to "overdoing it".  Anterior placenta. A pos.   +FM, denies LOF, vaginal discharge.   OB History    Gravida Para Term Preterm AB TAB SAB Ectopic Multiple Living   4 1 1  2  1   1     -miscariage 7 wks -miscarriage to 11wks    Past Medical History  Diagnosis Date  . Supervision of normal pregnancy 09/14/2011    [facility] (transfer from [facility]) Genetic Screen  Reports negative  Anatomic [place]  Normal @ 20 wks; poor visual of spine>rescan nml  Glucose Screen 150 >> needs 3 hr GTT  GBS   Feeding Preference  Breast  Contraception   Circumcision       . Colitis     Past Surgical History  Procedure Laterality Date  . 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 Cesarean section  01/30/2012    Procedure: CESAREAN SECTION;  Surgeon: Jonnie Kind, MD;  Location: [facility];  Service: Gynecology;  Laterality: N/A;  Primary Cesarean Section Delivery Baby Boy @ [phone], Apgars 4/9     Family History  Problem Relation Age of Onset  . Hypertension Mother     History  Substance Use Topics  . Smoking status: Former Research scientist (life sciences)  . Smokeless tobacco: Not on file  . Alcohol Use: No    Allergies:   Allergies  Allergen Reactions  . Sulfa Antibiotics Rash  . Adhesive [Tape] Rash  . Bactrim [Sulfamethoxazole-Trimethoprim] Rash  . Latex Itching and Rash    Prescriptions prior to admission  Medication Sig Dispense Refill Last Dose  . acetaminophen (TYLENOL) 500 MG tablet Take 1,000 mg by mouth every 6 (six) hours as needed. For leg cramps and headaches   08/14/2014 at Unknown time  . cetirizine (ZYRTEC) 10 MG tablet Take 10 mg by mouth daily.   08/13/2014 at Unknown time  . diphenhydrAMINE (BENADRYL) 25 MG tablet Take 1 tablet (25 mg total) by mouth every 6 (six) hours. 20 tablet 0 08/13/2014 at Unknown time  . hydrocortisone cream 1 % Apply to affected area 2 times daily 30 g 0 Past Week at Unknown time  . metroNIDAZOLE (FLAGYL) 500 MG tablet Take 1 tablet (500 mg total) by mouth 2 (two) times daily. 14 tablet 0 08/14/2014 at Unknown time  . permethrin (ELIMITE) 5 % cream Apply 1 application topically once.   0 Past Month at Unknown time  . Prenatal Vit-Fe Fumarate-FA (MULTIVITAMIN-PRENATAL) 27-0.8 MG TABS Take 1 tablet by mouth daily.   08/13/2014 at Unknown time    Review of Systems  Constitutional: Positive for malaise/fatigue. Negative for fever and chills.  Eyes: Negative  for blurred vision and double vision.  Respiratory: Negative for shortness of breath.   Cardiovascular: Negative for chest pain and leg swelling.  Gastrointestinal: Negative for nausea and vomiting.  Genitourinary: Negative for dysuria.  Neurological: Negative for dizziness and headaches.  Psychiatric/Behavioral: The patient is nervous/anxious.   Also per HPI.  Physical Exam   Blood pressure 109/74, pulse 84, temperature 98.2 F (36.8 C), temperature source Oral, resp. rate 18, height 5\' 4"  (1.626 m), weight 180 lb (81.647 kg), last menstrual period 03/05/2014, SpO2 99 %, unknown if currently breastfeeding.  Physical Exam  Constitutional: She is oriented to person, place, and time. She appears well-developed  and well-nourished. No distress.  HENT:  Head: Normocephalic and atraumatic.  Eyes: EOM are normal.  Cardiovascular: Normal rate, regular rhythm, normal heart sounds and intact distal pulses.   Respiratory: Effort normal and breath sounds normal.  GI: Soft. Bowel sounds are normal. There is no tenderness.  Musculoskeletal: Normal range of motion. She exhibits no edema or tenderness.  Neurological: She is alert and oriented to person, place, and time.  Skin: Skin is warm and dry.   MAU Course  Procedures - None  MDM: -FHT reassuring without contractions -UA normal -SVE with closed/thick/anterior/ballotable cervix  Assessment and Plan  Patient is 29 y.o. [id number] [redacted]w[redacted]d reporting pelvic pressure. No concerning red flags on exam or labs. Cervical exam closed and thick. Reassured patient.  - activity as tolerated - preterm labor precautions - f/u with Ob provider  Luiz Blare, DO 08/14/2014, 9:48 PM PGY-1, [place]  Results for orders placed or performed during the hospital encounter of 08/14/14 (from the past 24 hour(s))  Urinalysis, Routine w reflex microscopic     Status: Abnormal   Collection Time: 08/14/14  7:20 PM  Result Value Ref Range   Color, Urine YELLOW YELLOW   APPearance CLEAR CLEAR   Specific Gravity, Urine <1.005 (L) 1.005 - 1.030   pH 6.0 5.0 - 8.0   Glucose, UA NEGATIVE NEGATIVE mg/dL   Hgb urine dipstick NEGATIVE NEGATIVE   Bilirubin Urine NEGATIVE NEGATIVE   Ketones, ur NEGATIVE NEGATIVE mg/dL   Protein, ur NEGATIVE NEGATIVE mg/dL   Urobilinogen, UA 0.2 0.0 - 1.0 mg/dL   Nitrite NEGATIVE NEGATIVE   Leukocytes, UA NEGATIVE NEGATIVE   I was performed the cervical exam and agree with above.  [place], [place] 08/15/2014 2:37 AM

## 2014-08-31 ENCOUNTER — Ambulatory Visit (INDEPENDENT_AMBULATORY_CARE_PROVIDER_SITE_OTHER): Payer: Medicaid Other | Admitting: Certified Nurse Midwife

## 2014-08-31 VITALS — BP 117/77 | HR 78 | Wt 184.0 lb

## 2014-08-31 DIAGNOSIS — B9689 Other specified bacterial agents as the cause of diseases classified elsewhere: Secondary | ICD-10-CM

## 2014-08-31 DIAGNOSIS — A499 Bacterial infection, unspecified: Secondary | ICD-10-CM | POA: Diagnosis not present

## 2014-08-31 DIAGNOSIS — N760 Acute vaginitis: Secondary | ICD-10-CM | POA: Diagnosis not present

## 2014-08-31 MED ORDER — METRONIDAZOLE 500 MG PO TABS: 500.0000 mg | ORAL_TABLET | Freq: Two times a day (BID) | ORAL | Status: DC

## 2014-08-31 NOTE — Patient Instructions (Signed)
Bacterial Vaginosis Bacterial vaginosis is a vaginal infection that occurs when the normal balance of bacteria in the vagina is disrupted. It results from an overgrowth of certain bacteria. This is the most common vaginal infection in women of childbearing age. Treatment is important to prevent complications, especially in pregnant women, as it can cause a premature delivery. CAUSES  Bacterial vaginosis is caused by an increase in harmful bacteria that are normally present in smaller amounts in the vagina. Several different kinds of bacteria can cause bacterial vaginosis. However, the reason that the condition develops is not fully understood. RISK FACTORS Certain activities or behaviors can put you at an increased risk of developing bacterial vaginosis, including:  Having a new sex partner or multiple sex partners.  Douching.  Using an intrauterine device (IUD) for contraception. Women do not get bacterial vaginosis from toilet seats, bedding, swimming pools, or contact with objects around them. SIGNS AND SYMPTOMS  Some women with bacterial vaginosis have no signs or symptoms. Common symptoms include:  Grey vaginal discharge.  A fishlike odor with discharge, especially after sexual intercourse.  Itching or burning of the vagina and vulva.  Burning or pain with urination. DIAGNOSIS  Your health care provider will take a medical history and examine the vagina for signs of bacterial vaginosis. A sample of vaginal fluid may be taken. Your health care provider will look at this sample under a microscope to check for bacteria and abnormal cells. A vaginal pH test may also be done.  TREATMENT  Bacterial vaginosis may be treated with antibiotic medicines. These may be given in the form of a pill or a vaginal cream. A second round of antibiotics may be prescribed if the condition comes back after treatment.  HOME CARE INSTRUCTIONS   Only take over-the-counter or prescription medicines as  directed by your health care provider.  If antibiotic medicine was prescribed, take it as directed. Make sure you finish it even if you start to feel better.  Do not have sex until treatment is completed.  Tell all sexual partners that you have a vaginal infection. They should see their health care provider and be treated if they have problems, such as a mild rash or itching.  Practice safe sex by using condoms and only having one sex partner. SEEK MEDICAL CARE IF:   Your symptoms are not improving after 3 days of treatment.  You have increased discharge or pain.  You have a fever. MAKE SURE YOU:   Understand these instructions.  Will watch your condition.  Will get help right away if you are not doing well or get worse. FOR MORE INFORMATION  Centers for Disease Control and Prevention, Division of STD Prevention: [employer]raiser[employer]raud[employer] American Sexual Health Association (ASHA): www.ashastd.org  Document Released: 03/02/2005 Document Revised: 12/21/2012 Document Reviewed: 10/12/2012 [facility] Patient Information 2015 [place], [place]. This information is not intended to replace advice given to you by your health care provider. Make sure you discuss any questions you have with your health care provider. Glucose Tolerance Test During Pregnancy The glucose tolerance test (GTT) or 3-hour glucose test can be used to determine if a woman has diabetes that first begins or is first recognized during pregnancy (gestational diabetes). Typically, a GTT is done after you have had a 1-hour glucose test with results that indicate you possibly have gestational diabetes.  The test takes about 3 hours. There will be a series of blood tests after you drink the sugar-water solution. You must remain at the testing location  to make sure that your blood is drawn on time.  LET [facility] CARE PROVIDER KNOW ABOUT:  Allergies to food or medicine.  Medicines taken, including vitamins, herbs, eyedrops,  over-the-counter medicines, and creams.  Any recent illnesses or infections. BEFORE THE PROCEDURE The GTT is a fasting test, meaning you must stop eating for a certain amount of time. The test will be the most accurate if you have not eaten for 8-12 hours before the test. For this reason, it is recommended that you have this test done in the morning before you have breakfast. PROCEDURE  When you arrive at the lab, a sample of your blood is taken to get your fasting blood glucose level. After your fasting glucose level is determined, you will be given a sugar-water solution to drink. You will be asked to wait in a certain area until your next blood test. The blood tests are done each hour for 3 hours. Stay close to the lab so your blood samples can be taken on time. This is important. If the blood samples are not taken on time, the test will need to be done again on another day.  AFTER THE PROCEDURE  You can eat and drink as usual.   Ask when your test results will be ready. Make sure you get your test results. A positive test is considered when two of the four blood test values are equal or above the normal blood glucose level. Document Released: 09/01/2011 Document Revised: 07/17/2013 Document Reviewed: 07/07/2013 [facility] Patient Information 2015 [place], [place]. This information is not intended to replace advice given to you by your health care provider. Make sure you discuss any questions you have with your health care provider.

## 2014-08-31 NOTE — Progress Notes (Signed)
Pt will be treated for Bacterial Vaginitis. Flagyl sent to Pharmacy.

## 2014-08-31 NOTE — Progress Notes (Signed)
C/O yellow green vaginal discharge

## 2014-09-13 IMAGING — US US EXTREM LOW VENOUS*L*
1 series · 14 of 23 positions shown · non-contrast
Comparison: 11/08/2012

CLINICAL DATA: Left leg pain x1 year, bruising



[Series 1: us extrem low venous*left* · 14 of 23 slices shown]
[im 1/23]
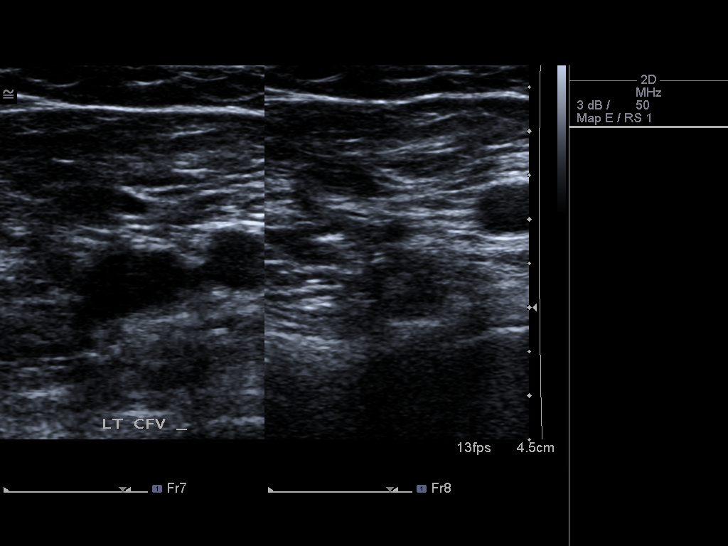
[im 3/23]
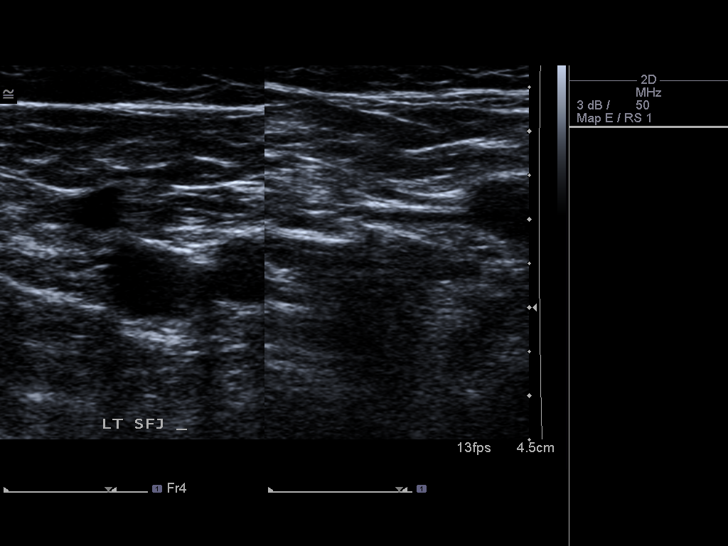
[im 5/23]
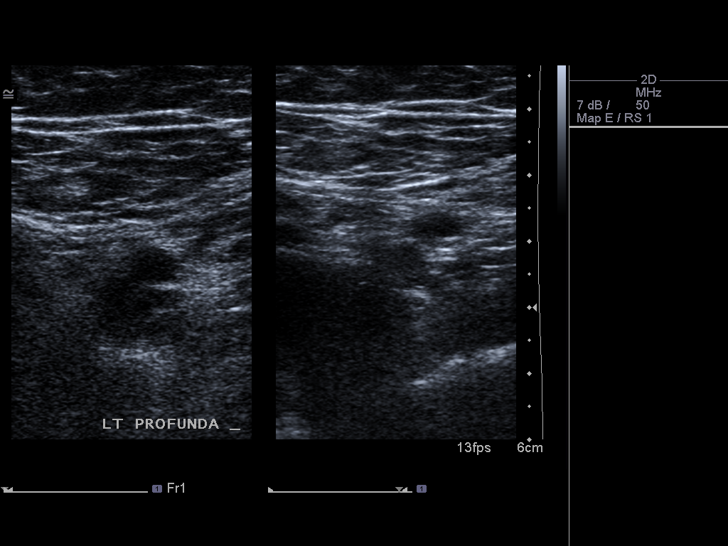
[im 6/23]
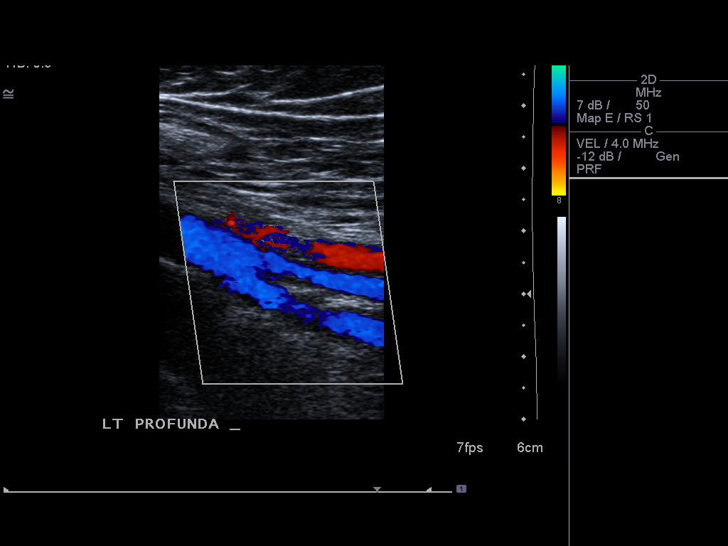
[im 8/23]
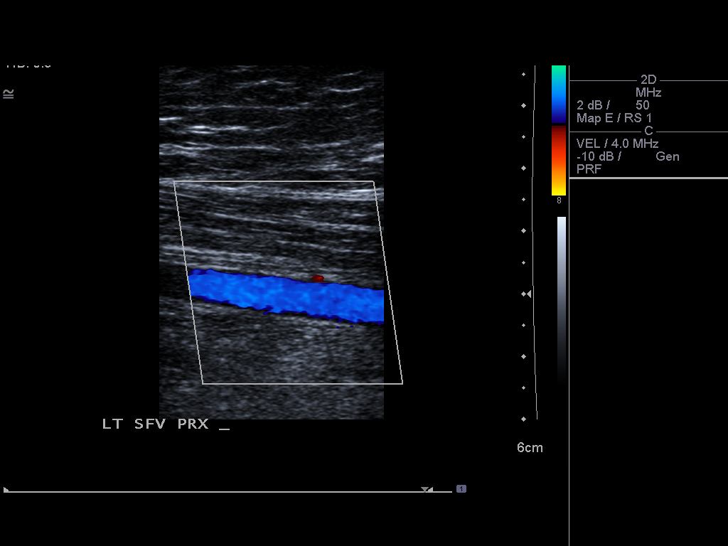
[im 10/23]
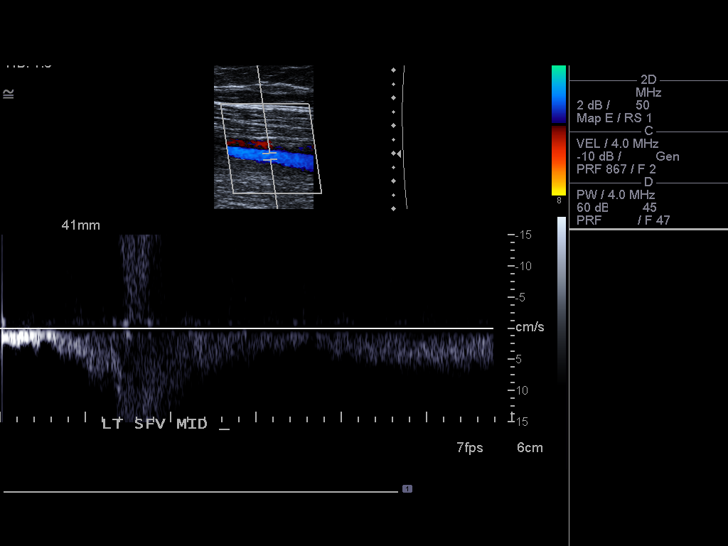
[im 11/23]
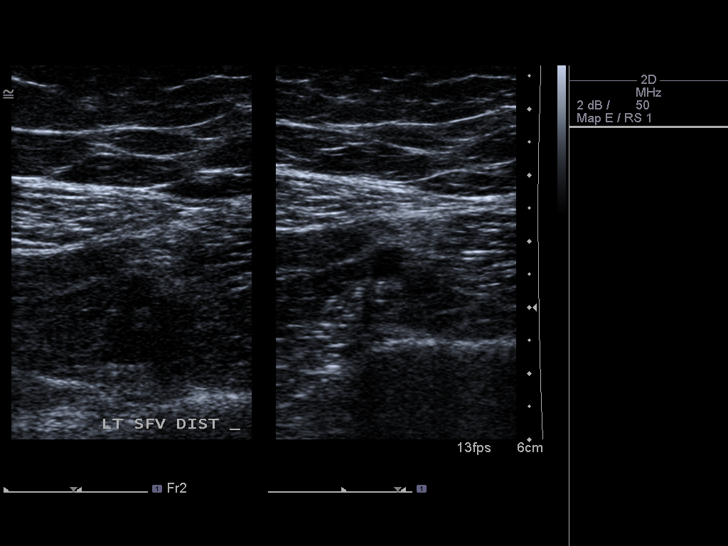
[im 13/23]
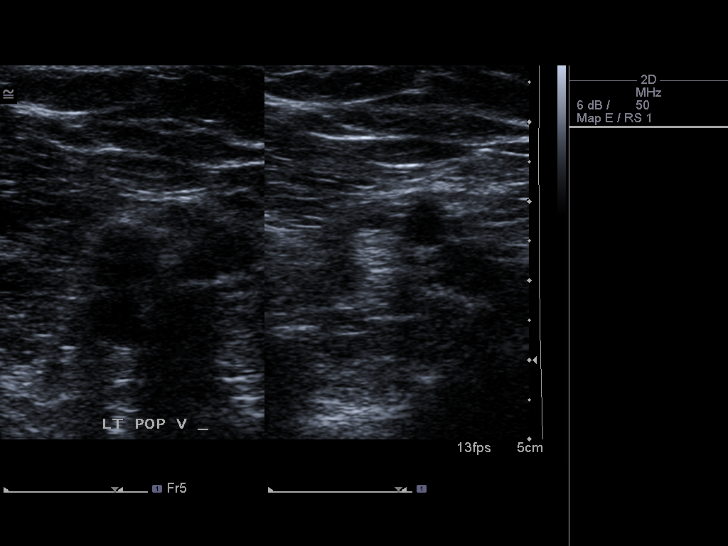
[im 14/23]
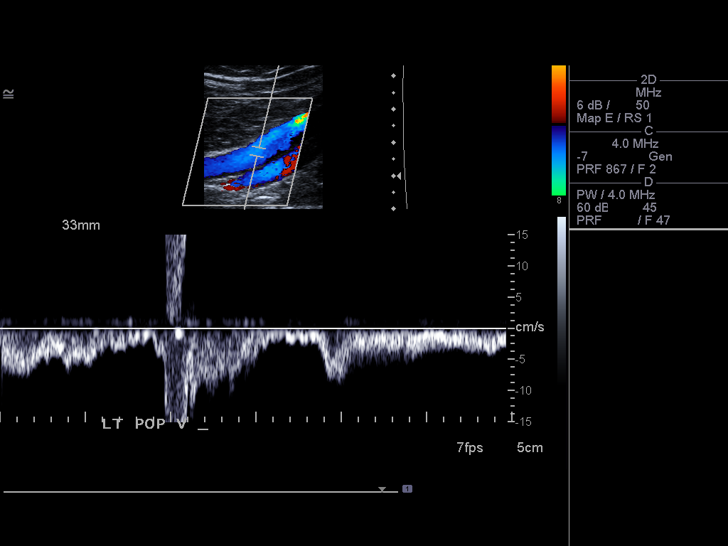
[im 16/23]
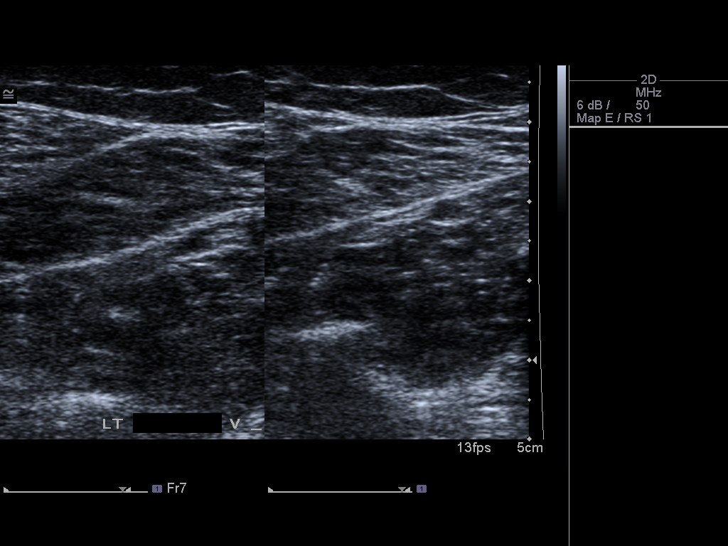
[im 18/23]
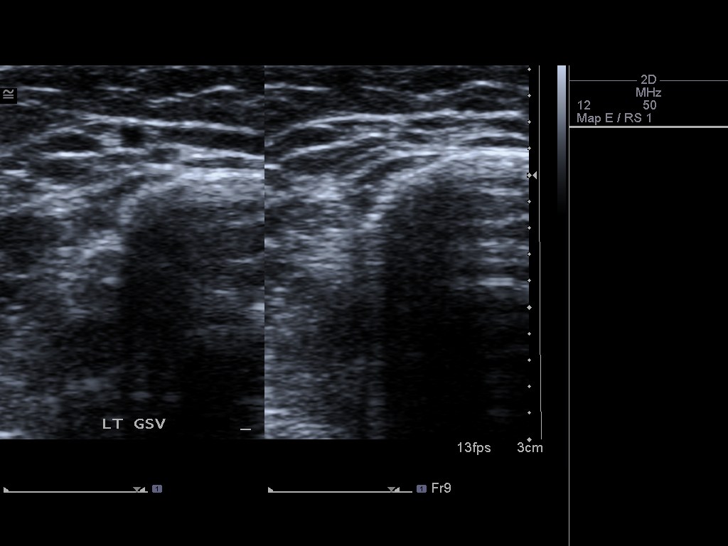
[im 19/23]
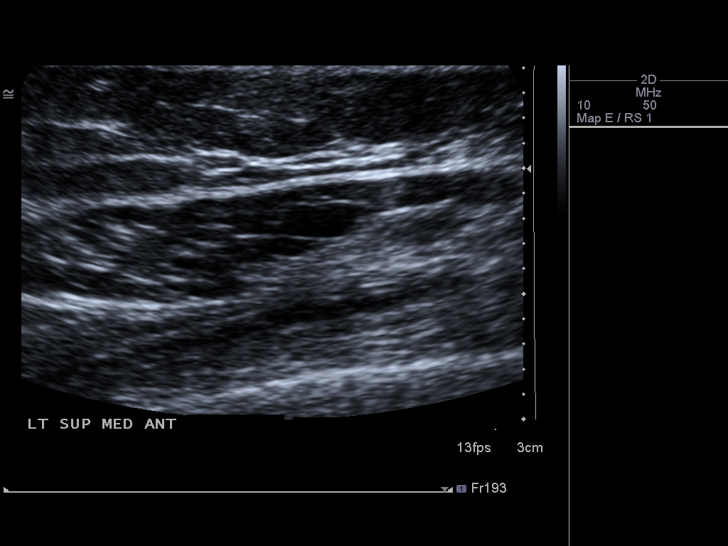
[im 21/23]
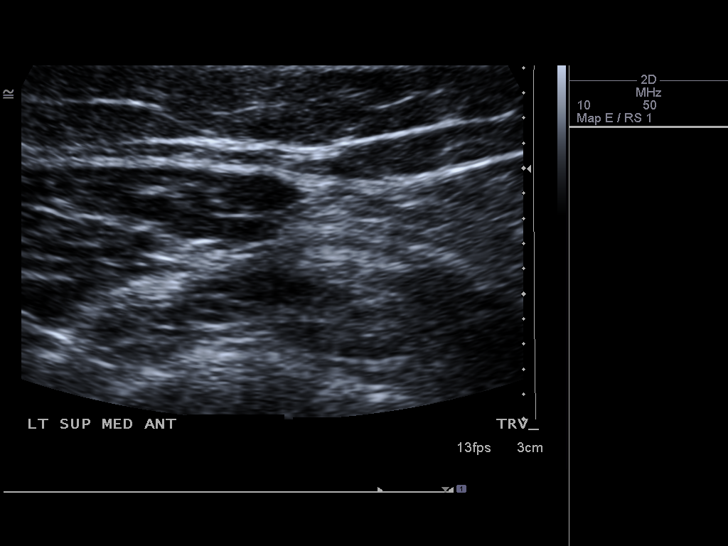
[im 23/23]
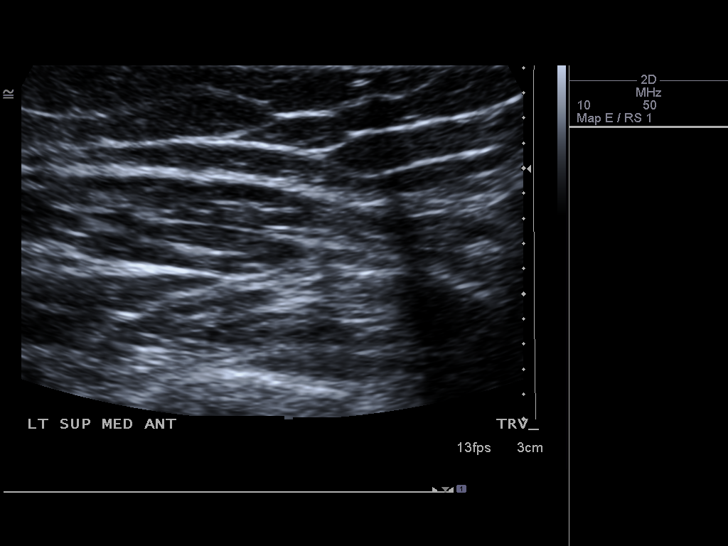

[14 of 23 positions shown; findings below may reference images not displayed]

FINDINGS: Visualized left lower extremity deep venous system appears patent.

Normal compressibility. Patent color Doppler flow. Satisfactory
spectral Doppler with respiratory variation and response to
augmentation.

Greater saphenous vein, where visualized, is patent and
compressible.
IMPRESSION: No deep venous thrombosis in the visualized left lower extremity.

## 2014-09-19 ENCOUNTER — Encounter: Payer: Self-pay | Admitting: Obstetrics & Gynecology

## 2014-09-19 ENCOUNTER — Ambulatory Visit (INDEPENDENT_AMBULATORY_CARE_PROVIDER_SITE_OTHER): Payer: Medicaid Other | Admitting: Obstetrics & Gynecology

## 2014-09-19 VITALS — BP 112/70 | HR 82 | Wt 191.0 lb

## 2014-09-19 DIAGNOSIS — O34219 Maternal care for unspecified type scar from previous cesarean delivery: Secondary | ICD-10-CM

## 2014-09-19 DIAGNOSIS — Z3482 Encounter for supervision of other normal pregnancy, second trimester: Secondary | ICD-10-CM

## 2014-09-19 DIAGNOSIS — Z36 Encounter for antenatal screening of mother: Secondary | ICD-10-CM | POA: Diagnosis not present

## 2014-09-19 DIAGNOSIS — O3421 Maternal care for scar from previous cesarean delivery: Secondary | ICD-10-CM

## 2014-09-19 LAB — CBC
HCT: 34.3 % — ABNORMAL LOW (ref 36.0–46.0)
Hemoglobin: 11.5 g/dL — ABNORMAL LOW (ref 12.0–15.0)
MCH: 30.2 pg (ref 26.0–34.0)
MCHC: 33.5 g/dL (ref 30.0–36.0)
MCV: 90 fL (ref 78.0–100.0)
MPV: 9.1 fL (ref 8.6–12.4)
PLATELETS: 273 10*3/uL (ref 150–400)
RBC: 3.81 MIL/uL — ABNORMAL LOW (ref 3.87–5.11)
RDW: 13.2 % (ref 11.5–15.5)
WBC: 7.4 10*3/uL (ref 4.0–10.5)

## 2014-09-19 LAB — HIV ANTIBODY (ROUTINE TESTING W REFLEX): HIV: NONREACTIVE

## 2014-09-19 NOTE — Patient Instructions (Addendum)
Return to clinic for any obstetric concerns or go to MAU for evaluation Thank you for enrolling in [place]. Please follow the instructions below to securely access your online medical record. MyChart allows you to send messages to your doctor, view your test results, manage appointments, and more. MyChart allows you to send messages to your doctor, view your lab results (as released by your physician), manage appointments, and more. To sign up, log on to [email or web address] using the Address Bar in your browser. Once you are logged on, click on the Sign Up Now link and you will access the new member signup page. Enter your MyChart Activation Code exactly as it appears below to complete the sign-up process. If you do not sign up before the expiration date, you must request a new code.  MyChart Activation Code: Activation code not generated Current MyChart Status: Active  If you have questions, you can call [phone] ([phone]) or e-mail [email or web address]  to talk to our [facility] staff. Remember, MyChart is NOT to be used for urgent needs. For medical emergencies, dial 911.

## 2014-09-19 NOTE — Progress Notes (Signed)
Third trimester labs ordered. Declines Tdap vaccine.  No other complaints or concerns.  Labor and fetal movement precautions reviewed.

## 2014-09-20 ENCOUNTER — Telehealth: Payer: Self-pay | Admitting: *Deleted

## 2014-09-20 LAB — RPR

## 2014-09-20 LAB — GLUCOSE TOLERANCE, 1 HOUR (50G) W/O FASTING: Glucose, 1 Hour GTT: 147 mg/dL — ABNORMAL HIGH (ref 70–140)

## 2014-09-20 NOTE — Telephone Encounter (Signed)
Pt notified of abnormal 1 hr GTT and is scheduled for a 3 hr GTT 09/24/14 per Dr Harolyn Rutherford.

## 2014-09-20 NOTE — Telephone Encounter (Signed)
-----   Message from Osborne Oman, MD sent at 09/20/2014 10:02 AM EDT ----- 1 hr GTT 147.  Please schedule for 3 hr GTT.

## 2014-09-24 ENCOUNTER — Other Ambulatory Visit: Payer: Medicaid Other

## 2014-09-24 DIAGNOSIS — R7309 Other abnormal glucose: Secondary | ICD-10-CM

## 2014-09-25 ENCOUNTER — Telehealth: Payer: Self-pay | Admitting: *Deleted

## 2014-09-25 ENCOUNTER — Encounter: Payer: Self-pay | Admitting: Obstetrics & Gynecology

## 2014-09-25 DIAGNOSIS — O24419 Gestational diabetes mellitus in pregnancy, unspecified control: Secondary | ICD-10-CM

## 2014-09-25 DIAGNOSIS — Z8632 Personal history of gestational diabetes: Secondary | ICD-10-CM | POA: Insufficient documentation

## 2014-09-25 HISTORY — DX: Personal history of gestational diabetes: Z86.32

## 2014-09-25 LAB — GLUCOSE TOLERANCE, 3 HOURS
GLUCOSE 3 HOUR GTT: 99 mg/dL (ref 70–144)
GLUCOSE, 2 HOUR-GESTATIONAL: 170 mg/dL — AB (ref 70–164)
Glucose Tolerance, 1 hour: 212 mg/dL — ABNORMAL HIGH (ref 70–189)
Glucose Tolerance, Fasting: 79 mg/dL (ref 70–104)

## 2014-09-25 NOTE — Telephone Encounter (Signed)
Pt notified of abnormal 3 hr GTT and [facility] for Gestational diabetes

## 2014-09-29 ENCOUNTER — Encounter (HOSPITAL_COMMUNITY): Payer: Self-pay

## 2014-09-29 ENCOUNTER — Inpatient Hospital Stay (HOSPITAL_COMMUNITY)
Admission: AD | Admit: 2014-09-29 | Discharge: 2014-09-29 | Disposition: A | Discharge status: Home / Self Care | Payer: Medicaid Other | Source: Ambulatory Visit | Attending: Obstetrics & Gynecology | Admitting: Obstetrics & Gynecology

## 2014-09-29 DIAGNOSIS — Z87891 Personal history of nicotine dependence: Secondary | ICD-10-CM | POA: Insufficient documentation

## 2014-09-29 DIAGNOSIS — Z3A29 29 weeks gestation of pregnancy: Secondary | ICD-10-CM | POA: Diagnosis not present

## 2014-09-29 LAB — URINALYSIS, ROUTINE W REFLEX MICROSCOPIC
Bilirubin Urine: NEGATIVE
GLUCOSE, UA: NEGATIVE mg/dL
Ketones, ur: NEGATIVE mg/dL
LEUKOCYTES UA: NEGATIVE
NITRITE: NEGATIVE
Protein, ur: NEGATIVE mg/dL
Specific Gravity, Urine: <1.005 — ABNORMAL LOW (ref 1.005–1.030)
Urobilinogen, UA: 0.2 mg/dL (ref 0.0–1.0)
pH: 5.5 (ref 5.0–8.0)

## 2014-09-29 LAB — WET PREP, GENITAL
Clue Cells Wet Prep HPF POC: NONE SEEN
Trich, Wet Prep: NONE SEEN
Yeast Wet Prep HPF POC: NONE SEEN

## 2014-09-29 LAB — URINE MICROSCOPIC-ADD ON

## 2014-09-29 LAB — GLUCOSE, CAPILLARY: Glucose-Capillary: 74 mg/dL (ref 65–99)

## 2014-09-29 NOTE — Progress Notes (Signed)
Report called to Dr. Juleen China. States she is about to go to a delivery but will take report.

## 2014-09-29 NOTE — MAU Note (Signed)
Pt presents complaining of more frequent and more intense braxton hicks contractions. Denies abnormal discharge or bleeding. Reports good fetal movement.

## 2014-09-29 NOTE — MAU Provider Note (Signed)
History     CSN: [id number]  Arrival date and time: 09/29/14 1643   None     Chief Complaint  Patient presents with  . Abdominal Pain   HPI Patient is 29 y.o. [id number] [redacted]w[redacted]d here with complaints of contractions. Ctx have been occuring for the past week. States that she had some green mucus discharge recently. Feels a lot of pelvic pressure.   Was treated for BV infection about 2 months ago.   Recently diagnosed with A1DM and is concerned about her blood sugar.   +FM, denies LOF, VB, contractions, vaginal discharge.   OB History    Gravida Para Term Preterm AB TAB SAB Ectopic Multiple Living   4 1 1  2  1   1       Past Medical History  Diagnosis Date  . Supervision of normal pregnancy 09/14/2011    [facility] (transfer from [facility]) Genetic Screen  Reports negative  Anatomic [place]  Normal @ 20 wks; poor visual of spine>rescan nml  Glucose Screen 150 >> needs 3 hr GTT  GBS   Feeding Preference  Breast  Contraception   Circumcision       . Colitis   . Gestational diabetes     Past Surgical History  Procedure Laterality Date  . 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 Cesarean section  01/30/2012    Procedure: CESAREAN SECTION;  Surgeon: Jonnie Kind, MD;  Location: [facility];  Service: Gynecology;  Laterality: N/A;  Primary Cesarean Section Delivery Baby Boy @ [phone], Apgars 4/9     Family History  Problem Relation Age of Onset  . Hypertension Mother     History  Substance Use Topics  . Smoking status: Former Research scientist (life sciences)  . Smokeless tobacco: Not on file  . Alcohol Use: No    Allergies:  Allergies  Allergen Reactions  . Sulfa Antibiotics Rash  . Adhesive [Tape] Rash  . Bactrim [Sulfamethoxazole-Trimethoprim] Rash  . Latex Itching and Rash    Prescriptions prior to admission  Medication Sig Dispense Refill Last Dose  . acetaminophen (TYLENOL) 500 MG tablet Take 1,000 mg by mouth every 6 (six) hours as needed for mild pain or  headache.    09/28/2014 at [facility]  . cetirizine (ZYRTEC) 10 MG tablet Take 10 mg by mouth daily.   09/28/2014 at Unknown time  . Prenatal Vit-Fe Fumarate-FA (MULTIVITAMIN-PRENATAL) 27-0.8 MG TABS Take 1 tablet by mouth daily.   09/28/2014 at Unknown time  . ranitidine (ZANTAC) 150 MG tablet Take 150 mg by mouth 2 (two) times daily as needed for heartburn.   09/29/2014 at Unknown time  . diphenhydrAMINE (BENADRYL) 25 MG tablet Take 1 tablet (25 mg total) by mouth every 6 (six) hours. (Patient not taking: Reported on 09/29/2014) 20 tablet 0 Taking  . hydrocortisone cream 1 % Apply to affected area 2 times daily (Patient not taking: Reported on 09/29/2014) 30 g 0 Taking    Review of Systems  Constitutional: Negative for fever, chills and malaise/fatigue.  HENT: Negative for congestion.   Eyes: Negative for blurred vision and double vision.  Respiratory: Negative for cough and shortness of breath.   Cardiovascular: Negative for chest pain, palpitations, claudication and leg swelling.  Gastrointestinal: Positive for abdominal pain (cramping). Negative for heartburn, nausea, vomiting, diarrhea and constipation.  Genitourinary: Positive for frequency. Negative for dysuria, urgency and hematuria.       Pelvic pressure with urination   Musculoskeletal:  Negative for myalgias and back pain.  Skin: Negative for itching and rash.  Neurological: Negative for dizziness, loss of consciousness and headaches.   Physical Exam   Blood pressure 128/79, pulse 88, temperature 98 F (36.7 C), temperature source Oral, resp. rate 18, last menstrual period 03/05/2014, unknown if currently breastfeeding.  Physical Exam  Constitutional: She is oriented to person, place, and time. She appears well-developed and well-nourished. No distress.  HENT:  Head: Normocephalic and atraumatic.  Eyes: Conjunctivae and EOM are normal.  Neck: Normal range of motion. No thyromegaly present.  Cardiovascular: Exam reveals no gallop and  no friction rub.   No murmur heard. Respiratory: No respiratory distress.  GI: Soft. She exhibits no distension. There is no tenderness.  Genitourinary: Vagina normal.  Musculoskeletal: Normal range of motion. She exhibits no edema.  Neurological: She is alert and oriented to person, place, and time.  Skin: Skin is warm and dry. No rash noted. No erythema.  Psychiatric: She has a normal mood and affect. Her behavior is normal.    Speculum Exam: Cervical os visually closed. Polyp seen on cervix at the 9:00 position. Minimal, thin white discharge pooling in posterior fornix. Dilation: Closed Effacement (%): Thick Cervical Position: Anterior Exam by:: K. Lakhia Gengler CNM  FHR: baseline 130, good variability, + accels, no decels Toco: 1 ctx. Uterine irritability.  MAU Course  Procedures  MDM UA collected. Speculum performed. Wet prep collected. CBG.  Assessment and Plan  Patient is 29 y.o. [id number] [redacted]w[redacted]d reporting contractions likely secondary to [facility]. - fetal kick counts reinforced - preterm labor precautions -CBG 74 2 hrs postprandial -->blood sugar well controlled  -UA and wet prep negative  -counseled that dehydration can cause contractions  -counseled pt to return to MAU for VB, LOF, decreased FM, and ctx increasing in intensity and frequency.  -stable for discharge to home   Melina Schools, D.O.  09/29/2014, 5:52 PM   I spoke with and examined patient and agree with resident/PA/SNM's note and plan of care. Reports braxton hicks over last few days. Good fm, denies vb or lof. Some greenish d/c x 1 w/o odor/itching/irritation. Cat I FHR/reactive, no uc's. Recently dx w/ GDM- has appt w/ dietician next week, hasn't started checking sugars yet. Requested to be checked while she was here.  To increase fluids, reviewed ptl s/s, fkc, reasons to return To keep next appt as scheduled   Results for orders placed or performed during the hospital encounter of 09/29/14 (from the  past 24 hour(s))  Urinalysis, Routine w reflex microscopic (not at [facility])     Status: Abnormal   Collection Time: 09/29/14  4:48 PM  Result Value Ref Range   Color, Urine YELLOW YELLOW   APPearance CLEAR CLEAR   Specific Gravity, Urine <1.005 (L) 1.005 - 1.030   pH 5.5 5.0 - 8.0   Glucose, UA NEGATIVE NEGATIVE mg/dL   Hgb urine dipstick TRACE (A) NEGATIVE   Bilirubin Urine NEGATIVE NEGATIVE   Ketones, ur NEGATIVE NEGATIVE mg/dL   Protein, ur NEGATIVE NEGATIVE mg/dL   Urobilinogen, UA 0.2 0.0 - 1.0 mg/dL   Nitrite NEGATIVE NEGATIVE   Leukocytes, UA NEGATIVE NEGATIVE  Urine microscopic-add on     Status: None   Collection Time: 09/29/14  4:48 PM  Result Value Ref Range   Squamous Epithelial / LPF RARE RARE   WBC, UA 0-2 <3 WBC/hpf   RBC / HPF 0-2 <3 RBC/hpf   Bacteria, UA RARE RARE  Wet prep, genital  Status: Abnormal   Collection Time: 09/29/14  6:22 PM  Result Value Ref Range   Yeast Wet Prep HPF POC NONE SEEN NONE SEEN   Trich, Wet Prep NONE SEEN NONE SEEN   Clue Cells Wet Prep HPF POC NONE SEEN NONE SEEN   WBC, Wet Prep HPF POC MANY (A) NONE SEEN  Glucose, capillary     Status: None   Collection Time: 09/29/14  6:38 PM  Result Value Ref Range   Glucose-Capillary 74 65 - 99 mg/dL    Roma Schanz, CNM, [facility] 09/29/2014 9:33 PM

## 2014-09-29 NOTE — MAU Note (Signed)
Urine in Lab 

## 2014-09-29 NOTE — Discharge Instructions (Signed)
Braxton Hicks Contractions °Contractions of the uterus can occur throughout pregnancy. Contractions are not always a sign that you are in labor.  °WHAT ARE BRAXTON HICKS CONTRACTIONS?  °Contractions that occur before labor are called Braxton Hicks contractions, or false labor. Toward the end of pregnancy (32-34 weeks), these contractions can develop more often and may become more forceful. This is not true labor because these contractions do not result in opening (dilatation) and thinning of the cervix. They are sometimes difficult to tell apart from true labor because these contractions can be forceful and people have different pain tolerances. You should not feel embarrassed if you go to the hospital with false labor. Sometimes, the only way to tell if you are in true labor is for your health care provider to look for changes in the cervix. °If there are no prenatal problems or other health problems associated with the pregnancy, it is completely safe to be sent home with false labor and await the onset of true labor. °HOW CAN YOU TELL THE DIFFERENCE BETWEEN TRUE AND FALSE LABOR? °False Labor °· The contractions of false labor are usually shorter and not as hard as those of true labor.   °· The contractions are usually irregular.   °· The contractions are often felt in the front of the lower abdomen and in the groin.   °· The contractions may go away when you walk around or change positions while lying down.   °· The contractions get weaker and are shorter lasting as time goes on.   °· The contractions do not usually become progressively stronger, regular, and closer together as with true labor.   °True Labor °· Contractions in true labor last 30-70 seconds, become very regular, usually become more intense, and increase in frequency.   °· The contractions do not go away with walking.   °· The discomfort is usually felt in the top of the uterus and spreads to the lower abdomen and low back.   °· True labor can be  determined by your health care provider with an exam. This will show that the cervix is dilating and getting thinner.   °WHAT TO REMEMBER °· Keep up with your usual exercises and follow other instructions given by your health care provider.   °· Take medicines as directed by your health care provider.   °· Keep your regular prenatal appointments.   °· Eat and drink lightly if you think you are going into labor.   °· If Braxton Hicks contractions are making you uncomfortable:   °¨ Change your position from lying down or resting to walking, or from walking to resting.   °¨ Sit and rest in a tub of warm water.   °¨ Drink 2-3 glasses of water. Dehydration may cause these contractions.   °¨ Do slow and deep breathing several times an hour.   °WHEN SHOULD I SEEK IMMEDIATE MEDICAL CARE? °Seek immediate medical care if: °· Your contractions become stronger, more regular, and closer together.   °· You have fluid leaking or gushing from your vagina.   °· You have a fever.   °· You pass blood-tinged mucus.   °· You have vaginal bleeding.   °· You have continuous abdominal pain.   °· You have low back pain that you never had before.   °· You feel your baby's head pushing down and causing pelvic pressure.   °· Your baby is not moving as much as it used to.   °Document Released: 03/02/2005 Document Revised: 03/07/2013 Document Reviewed: 12/12/2012 °ExitCare® Patient Information ©2015 [employer]. This information is not intended to replace advice given to you by your health care   provider. Make sure you discuss any questions you have with your health care provider. ° °

## 2014-10-03 ENCOUNTER — Encounter: Payer: Medicaid Other | Admitting: Obstetrics & Gynecology

## 2014-10-03 ENCOUNTER — Telehealth: Payer: Self-pay | Admitting: *Deleted

## 2014-10-03 ENCOUNTER — Encounter: Payer: Medicaid Other | Attending: Obstetrics & Gynecology

## 2014-10-03 VITALS — Ht 64.0 in | Wt 194.8 lb

## 2014-10-03 DIAGNOSIS — Z6833 Body mass index (BMI) 33.0-33.9, adult: Secondary | ICD-10-CM | POA: Diagnosis not present

## 2014-10-03 DIAGNOSIS — Z713 Dietary counseling and surveillance: Secondary | ICD-10-CM | POA: Diagnosis not present

## 2014-10-03 DIAGNOSIS — O24419 Gestational diabetes mellitus in pregnancy, unspecified control: Secondary | ICD-10-CM

## 2014-10-03 DIAGNOSIS — Z3A00 Weeks of gestation of pregnancy not specified: Secondary | ICD-10-CM | POA: Diagnosis not present

## 2014-10-03 MED ORDER — GLUCOSE BLOOD VI STRP: ORAL_STRIP | Status: DC

## 2014-10-03 MED ORDER — ACCU-CHEK FASTCLIX LANCETS MISC
1.0000 | Freq: Four times a day (QID) | Status: DC
Start: 2014-10-03 — End: 2015-01-17

## 2014-10-03 NOTE — Addendum Note (Signed)
Addended by: Phill Myron on: 10/03/2014 03:18 PM   Modules accepted: Orders

## 2014-10-03 NOTE — Addendum Note (Signed)
Addended by: Phill Myron on: 10/03/2014 02:53 PM   Modules accepted: Orders

## 2014-10-03 NOTE — Addendum Note (Signed)
Addended by: Phill Myron on: 10/03/2014 03:21 PM   Modules accepted: Orders

## 2014-10-03 NOTE — Telephone Encounter (Signed)
Lancets and Glucose strips for Accu chek sent to [facility]

## 2014-10-04 NOTE — Progress Notes (Signed)
  Patient was seen on 10/03/14 for Gestational Diabetes self-management . The following learning objectives were met by the patient :   States the definition of Gestational Diabetes  States why dietary management is important in controlling blood glucose  Describes the effects of carbohydrates on blood glucose levels  Demonstrates ability to create a balanced meal plan  Demonstrates carbohydrate counting   States when to check blood glucose levels  Demonstrates proper blood glucose monitoring techniques  States the effect of stress and exercise on blood glucose levels  States the importance of limiting caffeine and abstaining from alcohol and smoking  Plan:  Aim for 2 Carb Choices per meal (30 grams) +/- 1 either way for breakfast Aim for 3 Carb Choices per meal (45 grams) +/- 1 either way from lunch and dinner Aim for 1-2 Carbs per snack Begin reading food labels for Total Carbohydrate and sugar grams of foods Consider  increasing your activity level by walking daily as tolerated Begin checking BG before breakfast and 2 hours after first bit of breakfast, lunch and dinner after  as directed by MD  Take medication  as directed by MD  Blood glucose monitor given: Accu Chek aviva connect Lot # [id number] Exp: 08/14/15 Blood glucose reading: $RemoveBeforeDE'84mg'[id number]$ /dl  Patient instructed to monitor glucose levels: FBS: 60 - <90 2 hour: <120  Patient received the following handouts:  Nutrition Diabetes and Pregnancy  Carbohydrate Counting List  Meal Planning worksheet  Patient will be seen for follow-up as needed.

## 2014-10-05 ENCOUNTER — Ambulatory Visit (INDEPENDENT_AMBULATORY_CARE_PROVIDER_SITE_OTHER): Payer: Medicaid Other | Admitting: Family

## 2014-10-05 VITALS — BP 134/78 | HR 103 | Wt 195.0 lb

## 2014-10-05 DIAGNOSIS — Z3483 Encounter for supervision of other normal pregnancy, third trimester: Secondary | ICD-10-CM

## 2014-10-05 DIAGNOSIS — O34219 Maternal care for unspecified type scar from previous cesarean delivery: Secondary | ICD-10-CM

## 2014-10-05 DIAGNOSIS — O24410 Gestational diabetes mellitus in pregnancy, diet controlled: Secondary | ICD-10-CM

## 2014-10-05 DIAGNOSIS — O3421 Maternal care for scar from previous cesarean delivery: Secondary | ICD-10-CM

## 2014-10-05 NOTE — Progress Notes (Signed)
Subjective:  Stephanie Hopkins is a 29 y.o. G4P1021 at [redacted]w[redacted]d being seen today for ongoing prenatal care.  Patient reports lower pelvic cramping last night.  Denies UTI symptoms.  Contractions: Irritability.  Vag. Bleeding: None. Movement: Present. Denies leaking of fluid.   The following portions of the patient's history were reviewed and updated as appropriate: allergies, current medications, past family history, past medical history, past social history, past surgical history and problem list.   Objective:   Filed Vitals:   10/05/14 0844  BP: 134/78  Pulse: 103  Weight: 195 lb (88.451 kg)    Fetal Status: Fetal Heart Rate (bpm): 135   Movement: Present     General:  Alert, oriented and cooperative. Patient is in no acute distress.  Skin: Skin is warm and dry. No rash noted.   Cardiovascular: Normal heart rate noted  Respiratory: Normal respiratory effort, no problems with respiration noted  Abdomen: Soft, gravid, appropriate for gestational age. Pain/Pressure: Present     Vaginal: Vag. Bleeding: None.    Vag D/C Character: Thin  Cervix: Exam revealed      0/thick  Extremities: Normal range of motion.  Edema: Trace  Mental Status: Normal mood and affect. Normal behavior. Normal judgment and thought content.   Urinalysis: Urine Protein: Negative Urine Glucose: Negative  Leuks negative  CBG's:  All wnl for past two days; recently completed diabetic class  Assessment and Plan:  Pregnancy: G4P1021 at [redacted]w[redacted]d  1. Previous cesarean delivery affecting pregnancy, antepartum - Desires repeat  2.  Gestational Diabetes - Continue monitoring; kick counts  Preterm labor symptoms and general obstetric precautions including but not limited to vaginal bleeding, contractions, leaking of fluid and fetal movement were reviewed in detail with the patient. Please refer to After Visit Summary for other counseling recommendations.  Return in about 2 weeks (around 10/19/2014).   Venia Carbon Michiel Cowboy, CNM

## 2014-10-05 NOTE — Progress Notes (Signed)
Increase amount of braxton hicks last night

## 2014-10-09 ENCOUNTER — Telehealth: Payer: Self-pay

## 2014-10-09 NOTE — Telephone Encounter (Signed)
Patient states that she has had an increasing pain around her belly button that started this morning. Patient states it has increasingly become worse- patient made aware she can go to [facility] for evaluation- no appointments available in office. Patient denies any bleeding and baby has been moving well today with adequate fetal kick count.  Patient also complaining of increased nausea and occasional dizziness. Patient advise to eat small frequent meals and avoid greasy foods.. Be sure to consume adequate amounts of water. Also can try to eat a bland snack before getting up in the morning. Patient states understanding and to call back if symptoms not better. Kathrene Alu RN BSN

## 2014-10-18 ENCOUNTER — Ambulatory Visit (INDEPENDENT_AMBULATORY_CARE_PROVIDER_SITE_OTHER): Payer: Medicaid Other | Admitting: Obstetrics & Gynecology

## 2014-10-18 VITALS — BP 129/87 | HR 83 | Wt 197.0 lb

## 2014-10-18 DIAGNOSIS — O3421 Maternal care for scar from previous cesarean delivery: Secondary | ICD-10-CM

## 2014-10-18 DIAGNOSIS — O24419 Gestational diabetes mellitus in pregnancy, unspecified control: Secondary | ICD-10-CM

## 2014-10-18 DIAGNOSIS — O34219 Maternal care for unspecified type scar from previous cesarean delivery: Secondary | ICD-10-CM

## 2014-10-18 DIAGNOSIS — O368131 Decreased fetal movements, third trimester, fetus 1: Secondary | ICD-10-CM | POA: Diagnosis not present

## 2014-10-18 DIAGNOSIS — Z3483 Encounter for supervision of other normal pregnancy, third trimester: Secondary | ICD-10-CM

## 2014-10-18 NOTE — Progress Notes (Signed)
Subjective:  Stephanie Hopkins is a 29 y.o. G4P1021 at [redacted]w[redacted]d being seen today for ongoing prenatal care.  Patient reports some recent decreased fetal movement, although she has had 10 movements in the last 2 hours. Her fbs are all less than 90. I have told her that for the next week she doesn't need to check her fbs. All of her other values are also in range..  Contractions: Irritability.  Vag. Bleeding: None. Movement: (!) Decreased. Denies leaking of fluid.   The following portions of the patient's history were reviewed and updated as appropriate: allergies, current medications, past family history, past medical history, past social history, past surgical history and problem list.   Objective:   Filed Vitals:   10/18/14 1520  BP: 129/87  Pulse: 83  Weight: 197 lb (89.359 kg)    Fetal Status: Fetal Heart Rate (bpm): 128   Movement: (!) Decreased     General:  Alert, oriented and cooperative. Patient is in no acute distress.  Skin: Skin is warm and dry. No rash noted.   Cardiovascular: Normal heart rate noted  Respiratory: Normal respiratory effort, no problems with respiration noted  Abdomen: Soft, gravid, appropriate for gestational age. Pain/Pressure: Present     Vaginal: Vag. Bleeding: None.    Vag D/C Character: Thick  Cervix: Not evaluated        Extremities: Normal range of motion.  Edema: Trace  Mental Status: Normal mood and affect. Normal behavior. Normal judgment and thought content.   Urinalysis: Urine Protein: Negative Urine Glucose: Negative  Assessment and Plan:  Pregnancy: G4P1021 at [redacted]w[redacted]d  1. Previous cesarean delivery affecting pregnancy, antepartum   2. Gestational diabetes mellitus, antepartum Continue diet  Preterm labor symptoms and general obstetric precautions including but not limited to vaginal bleeding, contractions, leaking of fluid and fetal movement were reviewed in detail with the patient.  Please refer to After Visit Summary for other counseling  recommendations.  No Follow-up on file.   Emily Filbert, MD

## 2014-10-26 ENCOUNTER — Encounter: Payer: Self-pay | Admitting: Advanced Practice Midwife

## 2014-10-26 ENCOUNTER — Ambulatory Visit (INDEPENDENT_AMBULATORY_CARE_PROVIDER_SITE_OTHER): Payer: Medicaid Other | Admitting: Advanced Practice Midwife

## 2014-10-26 VITALS — BP 113/79 | HR 92 | Wt 200.0 lb

## 2014-10-26 DIAGNOSIS — O24410 Gestational diabetes mellitus in pregnancy, diet controlled: Secondary | ICD-10-CM

## 2014-10-26 DIAGNOSIS — O24419 Gestational diabetes mellitus in pregnancy, unspecified control: Secondary | ICD-10-CM

## 2014-10-26 DIAGNOSIS — Z3483 Encounter for supervision of other normal pregnancy, third trimester: Secondary | ICD-10-CM

## 2014-10-26 NOTE — Progress Notes (Signed)
Subjective:  Stephanie Hopkins is a 29 y.o. G4P1021 at [redacted]w[redacted]d being seen today for ongoing prenatal care.  Patient reports occasional contractions and decreased movement, short of breath sometimes when walking around, anxiety about baby.  Contractions: Irritability.  Vag. Bleeding: None. Movement: Present. Denies leaking of fluid.   States FBSs are all good. 2 hr PC sugars are wnl except for 5 of them over 120, highest 135 after eating donut.   The following portions of the patient's history were reviewed and updated as appropriate: allergies, current medications, past family history, past medical history, past social history, past surgical history and problem list.   Objective:   Filed Vitals:   10/26/14 1018  BP: 113/79  Pulse: 92  Weight: 200 lb (90.719 kg)    Fetal Status:     Movement: Present     States is worried about the baby. Has a lot of general anxiety about fetal wellbeing. States baby moving differently, wants NST.   NST reactive. She had some uterine irritabilty she does not feel. Very good accels and variabiltiy, Category I  General:  Alert, oriented and cooperative. Patient is in no acute distress.  Skin: Skin is warm and dry. No rash noted.   Cardiovascular: Normal heart rate noted  Respiratory: Normal respiratory effort, no problems with respiration noted  Abdomen: Soft, gravid, appropriate for gestational age. Pain/Pressure: Present     Pelvic: Vag. Bleeding: None Vag D/C Character: Thin   Cervical exam deferred        Extremities: Normal range of motion.  Edema: Trace  Mental Status: Normal mood and affect. Normal behavior. Normal judgment and thought content.   Urinalysis: Urine Protein: Negative Urine Glucose: Negative  Assessment and Plan:  Pregnancy: K2H0623 at [redacted]w[redacted]d  There are no diagnoses linked to this encounter. Preterm labor symptoms and general obstetric precautions including but not limited to vaginal bleeding, contractions, leaking of fluid and fetal  movement were reviewed in detail with the patient. Please refer to After Visit Summary for other counseling recommendations.  Return to office 2 weeks  Seabron Spates, CNM

## 2014-10-26 NOTE — Patient Instructions (Signed)
Third Trimester of Pregnancy The third trimester is from week 29 through week 42, months 7 through 9. The third trimester is a time when the fetus is growing rapidly. At the end of the ninth month, the fetus is about 20 inches in length and weighs 6-10 pounds.  BODY CHANGES Your body goes through many changes during pregnancy. The changes vary from woman to woman.   Your weight will continue to increase. You can expect to gain 25-35 pounds (11-16 kg) by the end of the pregnancy.  You may begin to get stretch marks on your hips, abdomen, and breasts.  You may urinate more often because the fetus is moving lower into your pelvis and pressing on your bladder.  You may develop or continue to have heartburn as a result of your pregnancy.  You may develop constipation because certain hormones are causing the muscles that push waste through your intestines to slow down.  You may develop hemorrhoids or swollen, bulging veins (varicose veins).  You may have pelvic pain because of the weight gain and pregnancy hormones relaxing your joints between the bones in your pelvis. Backaches may result from overexertion of the muscles supporting your posture.  You may have changes in your hair. These can include thickening of your hair, rapid growth, and changes in texture. Some women also have hair loss during or after pregnancy, or hair that feels dry or thin. Your hair will most likely return to normal after your baby is born.  Your breasts will continue to grow and be tender. A yellow discharge may leak from your breasts called colostrum.  Your belly button may stick out.  You may feel short of breath because of your expanding uterus.  You may notice the fetus "dropping," or moving lower in your abdomen.  You may have a bloody mucus discharge. This usually occurs a few days to a week before labor begins.  Your cervix becomes thin and soft (effaced) near your due date. WHAT TO EXPECT AT YOUR PRENATAL  EXAMS  You will have prenatal exams every 2 weeks until week 36. Then, you will have weekly prenatal exams. During a routine prenatal visit:  You will be weighed to make sure you and the fetus are growing normally.  Your blood pressure is taken.  Your abdomen will be measured to track your baby's growth.  The fetal heartbeat will be listened to.  Any test results from the previous visit will be discussed.  You may have a cervical check near your due date to see if you have effaced. At around 36 weeks, your caregiver will check your cervix. At the same time, your caregiver will also perform a test on the secretions of the vaginal tissue. This test is to determine if a type of bacteria, Group B streptococcus, is present. Your caregiver will explain this further. Your caregiver may ask you:  What your birth plan is.  How you are feeling.  If you are feeling the baby move.  If you have had any abnormal symptoms, such as leaking fluid, bleeding, severe headaches, or abdominal cramping.  If you have any questions. Other tests or screenings that may be performed during your third trimester include:  Blood tests that check for low iron levels (anemia).  Fetal testing to check the health, activity level, and growth of the fetus. Testing is done if you have certain medical conditions or if there are problems during the pregnancy. FALSE LABOR You may feel small, irregular contractions that   eventually go away. These are called Braxton Hicks contractions, or false labor. Contractions may last for hours, days, or even weeks before true labor sets in. If contractions come at regular intervals, intensify, or become painful, it is best to be seen by your caregiver.  SIGNS OF LABOR   Menstrual-like cramps.  Contractions that are 5 minutes apart or less.  Contractions that start on the top of the uterus and spread down to the lower abdomen and back.  A sense of increased pelvic pressure or back  pain.  A watery or bloody mucus discharge that comes from the vagina. If you have any of these signs before the 37th week of pregnancy, call your caregiver right away. You need to go to the hospital to get checked immediately. HOME CARE INSTRUCTIONS   Avoid all smoking, herbs, alcohol, and unprescribed drugs. These chemicals affect the formation and growth of the baby.  Follow your caregiver's instructions regarding medicine use. There are medicines that are either safe or unsafe to take during pregnancy.  Exercise only as directed by your caregiver. Experiencing uterine cramps is a good sign to stop exercising.  Continue to eat regular, healthy meals.  Wear a good support bra for breast tenderness.  Do not use hot tubs, steam rooms, or saunas.  Wear your seat belt at all times when driving.  Avoid raw meat, uncooked cheese, cat litter boxes, and soil used by cats. These carry germs that can cause birth defects in the baby.  Take your prenatal vitamins.  Try taking a stool softener (if your caregiver approves) if you develop constipation. Eat more high-fiber foods, such as fresh vegetables or fruit and whole grains. Drink plenty of fluids to keep your urine clear or pale yellow.  Take warm sitz baths to soothe any pain or discomfort caused by hemorrhoids. Use hemorrhoid cream if your caregiver approves.  If you develop varicose veins, wear support hose. Elevate your feet for 15 minutes, 3-4 times a day. Limit salt in your diet.  Avoid heavy lifting, wear low heal shoes, and practice good posture.  Rest a lot with your legs elevated if you have leg cramps or low back pain.  Visit your dentist if you have not gone during your pregnancy. Use a soft toothbrush to brush your teeth and be gentle when you floss.  A sexual relationship may be continued unless your caregiver directs you otherwise.  Do not travel far distances unless it is absolutely necessary and only with the approval  of your caregiver.  Take prenatal classes to understand, practice, and ask questions about the labor and delivery.  Make a trial run to the hospital.  Pack your hospital bag.  Prepare the baby's nursery.  Continue to go to all your prenatal visits as directed by your caregiver. SEEK MEDICAL CARE IF:  You are unsure if you are in labor or if your water has broken.  You have dizziness.  You have mild pelvic cramps, pelvic pressure, or nagging pain in your abdominal area.  You have persistent nausea, vomiting, or diarrhea.  You have a bad smelling vaginal discharge.  You have pain with urination. SEEK IMMEDIATE MEDICAL CARE IF:   You have a fever.  You are leaking fluid from your vagina.  You have spotting or bleeding from your vagina.  You have severe abdominal cramping or pain.  You have rapid weight loss or gain.  You have shortness of breath with chest pain.  You notice sudden or extreme swelling   of your face, hands, ankles, feet, or legs.  You have not felt your baby move in over an hour.  You have severe headaches that do not go away with medicine.  You have vision changes. Document Released: 02/24/2001 Document Revised: 03/07/2013 Document Reviewed: 05/03/2012 Exit[employer] Patient Information 2015 [employer]. This information is not intended to replace advice given to you by your health care provider. Make sure you discuss any questions you have with your health care provider.  

## 2014-10-26 NOTE — Progress Notes (Signed)
Pt c/o SOB and fatigue

## 2014-11-01 ENCOUNTER — Encounter: Payer: Medicaid Other | Admitting: Obstetrics & Gynecology

## 2014-11-02 ENCOUNTER — Encounter: Payer: Medicaid Other | Admitting: Advanced Practice Midwife

## 2014-11-09 ENCOUNTER — Ambulatory Visit (INDEPENDENT_AMBULATORY_CARE_PROVIDER_SITE_OTHER): Payer: Medicaid Other | Admitting: Advanced Practice Midwife

## 2014-11-09 VITALS — BP 130/80 | HR 107 | Wt 201.0 lb

## 2014-11-09 DIAGNOSIS — O24410 Gestational diabetes mellitus in pregnancy, diet controlled: Secondary | ICD-10-CM

## 2014-11-09 DIAGNOSIS — O24419 Gestational diabetes mellitus in pregnancy, unspecified control: Secondary | ICD-10-CM

## 2014-11-09 DIAGNOSIS — Z3482 Encounter for supervision of other normal pregnancy, second trimester: Secondary | ICD-10-CM

## 2014-11-11 NOTE — Patient Instructions (Signed)
Third Trimester of Pregnancy The third trimester is from week 29 through week 42, months 7 through 9. The third trimester is a time when the fetus is growing rapidly. At the end of the ninth month, the fetus is about 20 inches in length and weighs 6-10 pounds.  BODY CHANGES Your body goes through many changes during pregnancy. The changes vary from woman to woman.   Your weight will continue to increase. You can expect to gain 25-35 pounds (11-16 kg) by the end of the pregnancy.  You may begin to get stretch marks on your hips, abdomen, and breasts.  You may urinate more often because the fetus is moving lower into your pelvis and pressing on your bladder.  You may develop or continue to have heartburn as a result of your pregnancy.  You may develop constipation because certain hormones are causing the muscles that push waste through your intestines to slow down.  You may develop hemorrhoids or swollen, bulging veins (varicose veins).  You may have pelvic pain because of the weight gain and pregnancy hormones relaxing your joints between the bones in your pelvis. Backaches may result from overexertion of the muscles supporting your posture.  You may have changes in your hair. These can include thickening of your hair, rapid growth, and changes in texture. Some women also have hair loss during or after pregnancy, or hair that feels dry or thin. Your hair will most likely return to normal after your baby is born.  Your breasts will continue to grow and be tender. A yellow discharge may leak from your breasts called colostrum.  Your belly button may stick out.  You may feel short of breath because of your expanding uterus.  You may notice the fetus "dropping," or moving lower in your abdomen.  You may have a bloody mucus discharge. This usually occurs a few days to a week before labor begins.  Your cervix becomes thin and soft (effaced) near your due date. WHAT TO EXPECT AT YOUR PRENATAL  EXAMS  You will have prenatal exams every 2 weeks until week 36. Then, you will have weekly prenatal exams. During a routine prenatal visit:  You will be weighed to make sure you and the fetus are growing normally.  Your blood pressure is taken.  Your abdomen will be measured to track your baby's growth.  The fetal heartbeat will be listened to.  Any test results from the previous visit will be discussed.  You may have a cervical check near your due date to see if you have effaced. At around 36 weeks, your caregiver will check your cervix. At the same time, your caregiver will also perform a test on the secretions of the vaginal tissue. This test is to determine if a type of bacteria, Group B streptococcus, is present. Your caregiver will explain this further. Your caregiver may ask you:  What your birth plan is.  How you are feeling.  If you are feeling the baby move.  If you have had any abnormal symptoms, such as leaking fluid, bleeding, severe headaches, or abdominal cramping.  If you have any questions. Other tests or screenings that may be performed during your third trimester include:  Blood tests that check for low iron levels (anemia).  Fetal testing to check the health, activity level, and growth of the fetus. Testing is done if you have certain medical conditions or if there are problems during the pregnancy. FALSE LABOR You may feel small, irregular contractions that   eventually go away. These are called Braxton Hicks contractions, or false labor. Contractions may last for hours, days, or even weeks before true labor sets in. If contractions come at regular intervals, intensify, or become painful, it is best to be seen by your caregiver.  SIGNS OF LABOR   Menstrual-like cramps.  Contractions that are 5 minutes apart or less.  Contractions that start on the top of the uterus and spread down to the lower abdomen and back.  A sense of increased pelvic pressure or back  pain.  A watery or bloody mucus discharge that comes from the vagina. If you have any of these signs before the 37th week of pregnancy, call your caregiver right away. You need to go to the hospital to get checked immediately. HOME CARE INSTRUCTIONS   Avoid all smoking, herbs, alcohol, and unprescribed drugs. These chemicals affect the formation and growth of the baby.  Follow your caregiver's instructions regarding medicine use. There are medicines that are either safe or unsafe to take during pregnancy.  Exercise only as directed by your caregiver. Experiencing uterine cramps is a good sign to stop exercising.  Continue to eat regular, healthy meals.  Wear a good support bra for breast tenderness.  Do not use hot tubs, steam rooms, or saunas.  Wear your seat belt at all times when driving.  Avoid raw meat, uncooked cheese, cat litter boxes, and soil used by cats. These carry germs that can cause birth defects in the baby.  Take your prenatal vitamins.  Try taking a stool softener (if your caregiver approves) if you develop constipation. Eat more high-fiber foods, such as fresh vegetables or fruit and whole grains. Drink plenty of fluids to keep your urine clear or pale yellow.  Take warm sitz baths to soothe any pain or discomfort caused by hemorrhoids. Use hemorrhoid cream if your caregiver approves.  If you develop varicose veins, wear support hose. Elevate your feet for 15 minutes, 3-4 times a day. Limit salt in your diet.  Avoid heavy lifting, wear low heal shoes, and practice good posture.  Rest a lot with your legs elevated if you have leg cramps or low back pain.  Visit your dentist if you have not gone during your pregnancy. Use a soft toothbrush to brush your teeth and be gentle when you floss.  A sexual relationship may be continued unless your caregiver directs you otherwise.  Do not travel far distances unless it is absolutely necessary and only with the approval  of your caregiver.  Take prenatal classes to understand, practice, and ask questions about the labor and delivery.  Make a trial run to the hospital.  Pack your hospital bag.  Prepare the baby's nursery.  Continue to go to all your prenatal visits as directed by your caregiver. SEEK MEDICAL CARE IF:  You are unsure if you are in labor or if your water has broken.  You have dizziness.  You have mild pelvic cramps, pelvic pressure, or nagging pain in your abdominal area.  You have persistent nausea, vomiting, or diarrhea.  You have a bad smelling vaginal discharge.  You have pain with urination. SEEK IMMEDIATE MEDICAL CARE IF:   You have a fever.  You are leaking fluid from your vagina.  You have spotting or bleeding from your vagina.  You have severe abdominal cramping or pain.  You have rapid weight loss or gain.  You have shortness of breath with chest pain.  You notice sudden or extreme swelling   of your face, hands, ankles, feet, or legs.  You have not felt your baby move in over an hour.  You have severe headaches that do not go away with medicine.  You have vision changes. Document Released: 02/24/2001 Document Revised: 03/07/2013 Document Reviewed: 05/03/2012 Exit[employer] Patient Information 2015 [employer]. This information is not intended to replace advice given to you by your health care provider. Make sure you discuss any questions you have with your health care provider.  

## 2014-11-11 NOTE — Progress Notes (Signed)
Subjective:  Stephanie Hopkins is a 29 y.o. G4P1021 at [redacted]w[redacted]d being seen today for ongoing prenatal care.  Patient reports no complaints and States has been doing well on blood sugars, but never checks fasting (states was told not to).   States out of over 100 tests, only 20 were out of range, and most of those around 130.  Does not have book. But all but one this week have been under 120.  Encouraged to check FBS at least twice a week.    Contractions: Irritability.  Vag. Bleeding: None. Movement: Present. Denies leaking of fluid.   The following portions of the patient's history were reviewed and updated as appropriate: allergies, current medications, past family history, past medical history, past social history, past surgical history and problem list.   Objective:   Filed Vitals:   11/09/14 0948  BP: 130/80  Pulse: 107  Weight: 201 lb (91.173 kg)    Fetal Status: Fetal Heart Rate (bpm): 148   Movement: Present     General:  Alert, oriented and cooperative. Patient is in no acute distress.  Skin: Skin is warm and dry. No rash noted.   Cardiovascular: Normal heart rate noted  Respiratory: Normal respiratory effort, no problems with respiration noted  Abdomen: Soft, gravid, appropriate for gestational age. Pain/Pressure: Present     Pelvic: Vag. Bleeding: None Vag D/C Character: Thin   Cervical exam deferred        Extremities: Normal range of motion.  Edema: Trace  Mental Status: Normal mood and affect. Normal behavior. Normal judgment and thought content.   Urinalysis: Urine Protein: Negative Urine Glucose: Negative  Assessment and Plan:  Pregnancy: [id number] at [redacted]w[redacted]d                      Gestational Diabetes   Preterm labor symptoms and general obstetric precautions including but not limited to vaginal bleeding, contractions, leaking of fluid and fetal movement were reviewed in detail with the patient.  Wants to do GBS next week  Please refer to After Visit Summary for other  counseling recommendations.    Seabron Spates, CNM

## 2014-11-13 ENCOUNTER — Encounter (HOSPITAL_COMMUNITY): Payer: Self-pay | Admitting: *Deleted

## 2014-11-15 ENCOUNTER — Ambulatory Visit (INDEPENDENT_AMBULATORY_CARE_PROVIDER_SITE_OTHER): Payer: Medicaid Other | Admitting: Obstetrics & Gynecology

## 2014-11-15 VITALS — BP 133/88 | HR 84 | Wt 201.0 lb

## 2014-11-15 DIAGNOSIS — Z3483 Encounter for supervision of other normal pregnancy, third trimester: Secondary | ICD-10-CM

## 2014-11-15 DIAGNOSIS — O34219 Maternal care for unspecified type scar from previous cesarean delivery: Secondary | ICD-10-CM

## 2014-11-15 DIAGNOSIS — O3421 Maternal care for scar from previous cesarean delivery: Secondary | ICD-10-CM

## 2014-11-15 DIAGNOSIS — O24419 Gestational diabetes mellitus in pregnancy, unspecified control: Secondary | ICD-10-CM

## 2014-11-15 LAB — OB RESULTS CONSOLE GC/CHLAMYDIA
CHLAMYDIA, DNA PROBE: NEGATIVE
GC PROBE AMP, GENITAL: NEGATIVE

## 2014-11-15 NOTE — Progress Notes (Signed)
Increase in HA, dizziness,nausea

## 2014-11-16 ENCOUNTER — Encounter (HOSPITAL_COMMUNITY): Payer: Self-pay | Admitting: *Deleted

## 2014-11-16 ENCOUNTER — Inpatient Hospital Stay (HOSPITAL_COMMUNITY)
Admission: AD | Admit: 2014-11-16 | Discharge: 2014-11-16 | Disposition: A | Discharge status: Home / Self Care | Payer: Medicaid Other | Source: Ambulatory Visit | Attending: Obstetrics and Gynecology | Admitting: Obstetrics and Gynecology

## 2014-11-16 DIAGNOSIS — Z87891 Personal history of nicotine dependence: Secondary | ICD-10-CM | POA: Insufficient documentation

## 2014-11-16 DIAGNOSIS — O9989 Other specified diseases and conditions complicating pregnancy, childbirth and the puerperium: Secondary | ICD-10-CM | POA: Diagnosis not present

## 2014-11-16 DIAGNOSIS — O26893 Other specified pregnancy related conditions, third trimester: Secondary | ICD-10-CM | POA: Diagnosis not present

## 2014-11-16 DIAGNOSIS — R109 Unspecified abdominal pain: Secondary | ICD-10-CM | POA: Insufficient documentation

## 2014-11-16 DIAGNOSIS — Z3A36 36 weeks gestation of pregnancy: Secondary | ICD-10-CM | POA: Insufficient documentation

## 2014-11-16 DIAGNOSIS — O26899 Other specified pregnancy related conditions, unspecified trimester: Secondary | ICD-10-CM

## 2014-11-16 LAB — CBC
HEMATOCRIT: 36.8 % (ref 36.0–46.0)
Hemoglobin: 12.7 g/dL (ref 12.0–15.0)
MCH: 29.6 pg (ref 26.0–34.0)
MCHC: 34.5 g/dL (ref 30.0–36.0)
MCV: 85.8 fL (ref 78.0–100.0)
MPV: 9.2 fL (ref 8.6–12.4)
Platelets: 256 10*3/uL (ref 150–400)
RBC: 4.29 MIL/uL (ref 3.87–5.11)
RDW: 13.7 % (ref 11.5–15.5)
WBC: 9.5 10*3/uL (ref 4.0–10.5)

## 2014-11-16 LAB — COMPREHENSIVE METABOLIC PANEL
ALT: 12 U/L (ref 6–29)
AST: 17 U/L (ref 10–30)
Albumin: 3.2 g/dL — ABNORMAL LOW (ref 3.6–5.1)
Alkaline Phosphatase: 132 U/L — ABNORMAL HIGH (ref 33–115)
BILIRUBIN TOTAL: 0.2 mg/dL (ref 0.2–1.2)
BUN: 9 mg/dL (ref 7–25)
CALCIUM: 8.5 mg/dL — AB (ref 8.6–10.2)
CO2: 18 mmol/L — AB (ref 20–31)
Chloride: 105 mmol/L (ref 98–110)
Creat: 0.52 mg/dL (ref 0.50–1.10)
GLUCOSE: 117 mg/dL — AB (ref 65–99)
Potassium: 3.7 mmol/L (ref 3.5–5.3)
Sodium: 135 mmol/L (ref 135–146)
Total Protein: 6.6 g/dL (ref 6.1–8.1)

## 2014-11-16 LAB — URINALYSIS, ROUTINE W REFLEX MICROSCOPIC
BILIRUBIN URINE: NEGATIVE
Glucose, UA: NEGATIVE mg/dL
Hgb urine dipstick: NEGATIVE
Ketones, ur: NEGATIVE mg/dL
LEUKOCYTES UA: NEGATIVE
NITRITE: NEGATIVE
PH: 5.5 (ref 5.0–8.0)
Protein, ur: NEGATIVE mg/dL
Specific Gravity, Urine: <1.005 — ABNORMAL LOW (ref 1.005–1.030)
Urobilinogen, UA: 0.2 mg/dL (ref 0.0–1.0)

## 2014-11-16 LAB — GC/CHLAMYDIA PROBE AMP
CT PROBE, AMP APTIMA: NEGATIVE
GC Probe RNA: NEGATIVE

## 2014-11-16 MED ORDER — ZOLPIDEM TARTRATE ER 6.25 MG PO TBCR: 6.2500 mg | EXTENDED_RELEASE_TABLET | Freq: Every evening | ORAL | Status: DC | PRN

## 2014-11-16 NOTE — Discharge Instructions (Signed)
Premature Rupture and Preterm Premature Rupture of Membranes °A sac made up of membranes surrounds your baby in the womb (uterus). When this sac breaks before contractions or labor starts, it is called premature rupture of membranes (PROM). Rupture of membranes is also known as your water breaking. If this happens before 37 weeks, it is called preterm premature rupture of membranes (PPROM). PPROM is serious. It needs medical care right away. °CAUSES  °PROM may be caused by the membranes getting weak. This happens at the end of pregnancy. PPROM is often due to an infection, but can be caused by a number of other things.  °SIGNS OF PROM OR PPROM °· A sudden gush of fluid from the vagina. °· A slow leak of fluid from the vagina. °· Your underwear stay wet. °WHAT TO DO IF YOU THINK YOUR WATER BROKE °Call your doctor right away. You will need to go to the hospital to get checked right away. °WHAT HAPPENS IF YOU ARE TOLD YOU HAVE PROM OR PPROM? °You will have tests done at the hospital. If you have PROM, you may be given medicine to start labor (induced). This may happen if you are not having contractions within 24 hours of your water breaking. If you have PPROM and are not having contractions, you may be given medicine to start labor. It will depend on how far along you are in your pregnancy. °If you have PPROM, you: °· And your baby will be watched closely for signs of infection or other problems. °· May be given an antibiotic medicine. This can stop an infection from starting. °· May be given a steroid medicine. This can help the lungs to develop faster. °· May be given a medicine to stop early labor (preterm labor). °· May be told to stay in bed except to use the restroom (bed rest). °· May be given medicine to start labor. This can happen if there are problems with you or the baby. °Your treatment will depend on many factors. °Document Released: 05/29/2008 Document Revised: 11/02/2012 Document Reviewed:  06/21/2012 °ExitCare® Patient Information ©2015 [employer]. This information is not intended to replace advice given to you by your health care provider. Make sure you discuss any questions you have with your health care provider. ° °

## 2014-11-16 NOTE — MAU Provider Note (Signed)
History     CSN: [id number]  Arrival date and time: 11/16/14 2031   First Provider Initiated Contact with Patient 11/16/14 2136      Chief Complaint  Patient presents with  . Abdominal Pain   HPI  Patient is 29 y.o. [id number] [redacted]w[redacted]d here with complaints of abdominal pain.  [name] reports that beginning this morning, she started to experience upper abdominal pain. She describes the pain as feeling like someone is ripping her abdomen from the inside. The pain is worse when she bends over, and sometimes worse when she feels the baby move. The pain is mostly midline stopping at her umbilicus. She is very concerned because she was told she has an anterior placenta, and the pain is at the front of her abdomen. She denies any blood loss or LOF.  She also reports increasingly severe headaches for the past 2 weeks. Tylenol helps with the pain some. She denies changes in vision, RUQ abdominal pain, increased swelling of hands or feet. She reports one BP of 133/88, but says she has not been informed of any other high BPs during this pregnancy. Her last prenatal appt was yesterday; she was told her cervix was closed.  Endorses +FM, [facility] but no regular contractions.    Past Medical History  Diagnosis Date  . Supervision of normal pregnancy 09/14/2011    [facility] (transfer from [place]) Genetic Screen  Reports negative  Anatomic [place]  Normal @ 20 wks; poor visual of spine>rescan nml  Glucose Screen 150 >> needs 3 hr GTT  GBS   Feeding Preference  Breast  Contraception   Circumcision       . Colitis   . Gestational diabetes     Past Surgical History  Procedure Laterality Date  . 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 Cesarean section  01/30/2012    Procedure: CESAREAN SECTION;  Surgeon: Jonnie Kind, MD;  Location: [facility];  Service: Gynecology;  Laterality: N/A;  Primary Cesarean Section Delivery Baby Boy @ [phone], Apgars 4/9     Family  History  Problem Relation Age of Onset  . Hypertension Mother     Social History  Substance Use Topics  . Smoking status: Former Research scientist (life sciences)  . Smokeless tobacco: None  . Alcohol Use: No    Allergies:  Allergies  Allergen Reactions  . Sulfa Antibiotics Rash  . Adhesive [Tape] Rash  . Bactrim [Sulfamethoxazole-Trimethoprim] Rash  . Latex Itching and Rash    No prescriptions prior to admission    Review of Systems  Eyes: Negative for blurred vision and double vision.  Respiratory: Positive for shortness of breath (when laying on back).   Cardiovascular: Negative for leg swelling.  Gastrointestinal: Positive for nausea. Negative for abdominal pain.  Musculoskeletal: Positive for neck pain. Negative for back pain.  Neurological: Positive for headaches.   Physical Exam   Blood pressure 115/75, pulse 80, temperature 97.4 F (36.3 C), temperature source Oral, resp. rate 18, height 5\' 5"  (1.651 m), weight 205 lb 12.8 oz (93.35 kg), last menstrual period 03/05/2014, unknown if currently breastfeeding.  Physical Exam  Constitutional: She is oriented to person, place, and time. She appears well-developed and well-nourished. No distress.  HENT:  Head: Normocephalic and atraumatic.  Cardiovascular: Normal rate, regular rhythm and normal heart sounds.   No murmur heard. Respiratory:  Non-labored breathing  GI: There is no tenderness.  Gravid  Genitourinary:  Cervix closed. No  vaginal bleeding or discharge noted.   Musculoskeletal: She exhibits no edema.  Neurological: She is alert and oriented to person, place, and time. No cranial nerve deficit.  Skin: Skin is warm and dry.  Psychiatric: She has a normal mood and affect. Her behavior is normal.    MAU Course  Procedures None  Assessment and Plan   A: Patient is 29 y.o. [id number] [redacted]w[redacted]d reporting abdominal pain. Given lack of vaginal bleeding and reassuring FHR, unlikely due to issues with placenta as patient is concerned  about. Cervix still closed and no contractions. Will discharge.   P: Discharge home - Reviewed findings and my conclusion - Preterm labor precautions dicussed - Handout given - Follow-up with OB provider   Adin Hector, MD PGY-1 [facility]  11/16/2014, 10:21 PM   CNM attestation:  I have seen and examined this patient; I agree with above documentation in the resident's note.   Stephanie Hopkins is a 19 y.o. [phone] reporting upper abd 'ripping' sensation; pt points at this discomfort as being at her fundus +FM, denies LOF, VB, contractions, vaginal discharge.  PE: BP 115/75 mmHg  Pulse 80  Temp(Src) 97.4 F (36.3 C) (Oral)  Resp 18  Ht 5\' 5"  (1.651 m)  Wt 93.35 kg (205 lb 12.8 oz)  BMI 34.25 kg/m2  LMP 03/05/2014 Gen: calm comfortable, NAD Resp: normal effort, no distress Abd: gravid, soft  ROS, labs, PMH reviewed NST reactive, no ctx  Urinalysis    Component Value Date/Time   COLORURINE YELLOW 11/16/2014 2035   APPEARANCEUR CLEAR 11/16/2014 2035   LABSPEC <1.005* 11/16/2014 2035   PHURINE 5.5 11/16/2014 2035   GLUCOSEU NEGATIVE 11/16/2014 2035   HGBUR NEGATIVE 11/16/2014 2035   BILIRUBINUR NEGATIVE 11/16/2014 2035   BILIRUBINUR neg 02/19/2012 1012   KETONESUR NEGATIVE 11/16/2014 2035   PROTEINUR NEGATIVE 11/16/2014 2035   PROTEINUR neg 02/19/2012 1012   UROBILINOGEN 0.2 11/16/2014 2035   UROBILINOGEN negative 02/19/2012 1012   NITRITE NEGATIVE 11/16/2014 2035   NITRITE neg 02/19/2012 1012   LEUKOCYTESUR NEGATIVE 11/16/2014 2035     Plan: - reassured re placenta not in the fundus of her uterus (at the location of her discomfort) and Cat 1 FHR showing fetal well-being - continue routine follow up in [facility] clinic  Serita Grammes, CNM 11:58 PM  11/17/2014

## 2014-11-16 NOTE — MAU Note (Signed)
Pain in upper abd, Esp when i lean over feels like i am "being ripped". Sometimes hurts lower abd. Have anterior placenta. No bleeding or LOF

## 2014-11-17 LAB — CULTURE, BETA STREP (GROUP B ONLY)

## 2014-11-20 ENCOUNTER — Encounter: Payer: Self-pay | Admitting: *Deleted

## 2014-11-23 ENCOUNTER — Ambulatory Visit (INDEPENDENT_AMBULATORY_CARE_PROVIDER_SITE_OTHER): Payer: Medicaid Other | Admitting: Family

## 2014-11-23 VITALS — BP 124/84 | HR 86 | Wt 206.0 lb

## 2014-11-23 DIAGNOSIS — Z3A37 37 weeks gestation of pregnancy: Secondary | ICD-10-CM

## 2014-11-23 DIAGNOSIS — O34219 Maternal care for unspecified type scar from previous cesarean delivery: Secondary | ICD-10-CM

## 2014-11-23 DIAGNOSIS — O3421 Maternal care for scar from previous cesarean delivery: Secondary | ICD-10-CM

## 2014-11-23 DIAGNOSIS — O24410 Gestational diabetes mellitus in pregnancy, diet controlled: Secondary | ICD-10-CM | POA: Diagnosis not present

## 2014-11-23 LAB — GLUCOSE, POCT (MANUAL RESULT ENTRY): POC Glucose: 81 mg/dl (ref 70–99)

## 2014-11-23 NOTE — Progress Notes (Signed)
Subjective:  Stephanie Hopkins is a 29 y.o. G4P1021 at [redacted]w[redacted]d being seen today for ongoing prenatal care.  Patient reports fatigue and occasional contractions.  Contractions: Irregular.  Vag. Bleeding: None. Movement: Present. Denies leaking of fluid. Repeat csection scheduled for 9/20.  Did not bring glucose log; report majority of values wnl.  A few over 120.  +good fetal movement.    The following portions of the patient's history were reviewed and updated as appropriate: allergies, current medications, past family history, past medical history, past social history, past surgical history and problem list.   Objective:   Filed Vitals:   11/23/14 0930  BP: 124/84  Pulse: 86  Weight: 206 lb (93.441 kg)    Fetal Status: Fetal Heart Rate (bpm): 126   Movement: Present     General:  Alert, oriented and cooperative. Patient is in no acute distress.  Skin: Skin is warm and dry. No rash noted.   Cardiovascular: Normal heart rate noted  Respiratory: Normal respiratory effort, no problems with respiration noted  Abdomen: Soft, gravid, appropriate for gestational age. Pain/Pressure: Present     Pelvic: Vag. Bleeding: None Vag D/C Character: Thin   Cervical exam deferred        Extremities: Normal range of motion.  Edema: Trace  Mental Status: Normal mood and affect. Normal behavior. Normal judgment and thought content.   Urinalysis: Urine Protein: Negative Urine Glucose: Negative; Random CBG 81  Assessment and Plan:  Pregnancy: G4P1021 at [redacted]w[redacted]d  1. Previous cesarean delivery affecting pregnancy, antepartum - [place] MFM OB FOLLOW UP; Future  2.  Gestational Diabetes - Bring log to next visit; monitor fetal movements - Growth ultrasound  Preterm labor symptoms and general obstetric precautions including but not limited to vaginal bleeding, contractions, leaking of fluid and fetal movement were reviewed in detail with the patient. Please refer to After Visit Summary for other counseling  recommendations.  Return in about 1 week (around 11/30/2014).   Venia Carbon Michiel Cowboy, CNM

## 2014-11-25 ENCOUNTER — Inpatient Hospital Stay (HOSPITAL_COMMUNITY)
Admission: AD | Admit: 2014-11-25 | Discharge: 2014-11-25 | Disposition: A | Discharge status: Home / Self Care | Payer: Medicaid Other | Source: Ambulatory Visit | Attending: Obstetrics & Gynecology | Admitting: Obstetrics & Gynecology

## 2014-11-25 ENCOUNTER — Encounter (HOSPITAL_COMMUNITY): Payer: Self-pay | Admitting: *Deleted

## 2014-11-25 DIAGNOSIS — Z882 Allergy status to sulfonamides status: Secondary | ICD-10-CM | POA: Diagnosis not present

## 2014-11-25 DIAGNOSIS — Z3A37 37 weeks gestation of pregnancy: Secondary | ICD-10-CM | POA: Diagnosis not present

## 2014-11-25 DIAGNOSIS — Z87891 Personal history of nicotine dependence: Secondary | ICD-10-CM | POA: Insufficient documentation

## 2014-11-25 DIAGNOSIS — O9989 Other specified diseases and conditions complicating pregnancy, childbirth and the puerperium: Secondary | ICD-10-CM | POA: Diagnosis not present

## 2014-11-25 DIAGNOSIS — O24419 Gestational diabetes mellitus in pregnancy, unspecified control: Secondary | ICD-10-CM | POA: Insufficient documentation

## 2014-11-25 DIAGNOSIS — E162 Hypoglycemia, unspecified: Secondary | ICD-10-CM | POA: Diagnosis not present

## 2014-11-25 LAB — URINALYSIS, ROUTINE W REFLEX MICROSCOPIC
BILIRUBIN URINE: NEGATIVE
Glucose, UA: NEGATIVE mg/dL
Hgb urine dipstick: NEGATIVE
KETONES UR: NEGATIVE mg/dL
Leukocytes, UA: NEGATIVE
Nitrite: NEGATIVE
PH: 6.5 (ref 5.0–8.0)
Protein, ur: NEGATIVE mg/dL
UROBILINOGEN UA: 0.2 mg/dL (ref 0.0–1.0)

## 2014-11-25 LAB — GLUCOSE, CAPILLARY
GLUCOSE-CAPILLARY: 99 mg/dL (ref 65–99)
Glucose-Capillary: 96 mg/dL (ref 65–99)

## 2014-11-25 NOTE — MAU Provider Note (Signed)
History    G4P1021 at 37.6 wks in with c/o hypoglycemia. States became dizzy,weak and clammy after eating. CBG on admit 99.  CSN: [id number]  Arrival date and time: 11/25/14 2157   None     No chief complaint on file.  HPI  OB History    Gravida Para Term Preterm AB TAB SAB Ectopic Multiple Living   4 1 1  2  1   1       Obstetric Comments   Gestational Diabetes       Past Medical History  Diagnosis Date  . Supervision of normal pregnancy 09/14/2011    [facility] (transfer from [place]) Genetic Screen  Reports negative  Anatomic [place]  Normal @ 20 wks; poor visual of spine>rescan nml  Glucose Screen 150 >> needs 3 hr GTT  GBS   Feeding Preference  Breast  Contraception   Circumcision       . Colitis   . Gestational diabetes     Past Surgical History  Procedure Laterality Date  . 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 Cesarean section  01/30/2012    Procedure: CESAREAN SECTION;  Surgeon: Jonnie Kind, MD;  Location: [facility];  Service: Gynecology;  Laterality: N/A;  Primary Cesarean Section Delivery Baby Boy @ [phone], Apgars 4/9     Family History  Problem Relation Age of Onset  . Hypertension Mother     Social History  Substance Use Topics  . Smoking status: Former Research scientist (life sciences)  . Smokeless tobacco: Not on file  . Alcohol Use: No    Allergies:  Allergies  Allergen Reactions  . Sulfa Antibiotics Rash  . Adhesive [Tape] Rash  . Bactrim [Sulfamethoxazole-Trimethoprim] Rash  . Latex Itching and Rash    Prescriptions prior to admission  Medication Sig Dispense Refill Last Dose  . ACCU-CHEK FASTCLIX LANCETS MISC 1 Device by Percutaneous route 4 (four) times daily. 100 each 12 Taking  . acetaminophen (TYLENOL) 500 MG tablet Take 1,000 mg by mouth every 6 (six) hours as needed for mild pain or headache.    Taking  . cetirizine (ZYRTEC) 10 MG tablet Take 10 mg by mouth daily.   Taking  . glucose blood test strip Accu check  advantage test strips use as instructed for testing four time daily. ICD-10:O24.419 100 each 12 Taking  . Prenatal Vit-Fe Fumarate-FA (MULTIVITAMIN-PRENATAL) 27-0.8 MG TABS Take 1 tablet by mouth daily.   Taking  . ranitidine (ZANTAC) 150 MG tablet Take 150 mg by mouth 2 (two) times daily as needed for heartburn.   Taking  . zolpidem (AMBIEN CR) 6.25 MG CR tablet Take 1 tablet (6.25 mg total) by mouth at bedtime as needed for sleep. 15 tablet 0 Taking    Review of Systems  Constitutional: Negative.   HENT: Negative.   Eyes: Negative.   Respiratory: Negative.   Cardiovascular: Negative.   Gastrointestinal: Negative.   Genitourinary: Negative.   Musculoskeletal: Negative.   Skin: Negative.   Neurological: Negative.   Endo/Heme/Allergies: Negative.   Psychiatric/Behavioral: Negative.    Physical Exam   Blood pressure 130/76, pulse 85, temperature 98.3 F (36.8 C), temperature source Oral, resp. rate 16, height 5\' 4"  (1.626 m), weight 209 lb 12.8 oz (95.165 kg), last menstrual period 03/05/2014, SpO2 99 %, unknown if currently breastfeeding.  Physical Exam  Constitutional: She is oriented to person, place, and time. She appears well-developed and well-nourished.  HENT:  Head: Normocephalic.  Neck:  Normal range of motion. Neck supple.  Cardiovascular: Normal rate, regular rhythm and normal heart sounds.   Respiratory: Effort normal and breath sounds normal. No respiratory distress.  GI: Soft. There is no tenderness.  Genitourinary: No bleeding in the vagina. No vaginal discharge (mucusy) found.  Musculoskeletal: Normal range of motion. She exhibits no edema.  Neurological: She is alert and oriented to person, place, and time.  Skin: Skin is warm and dry.  Psychiatric: She has a normal mood and affect. Her behavior is normal. Judgment and thought content normal.    MAU Course  Procedures  MDM Normal glucose  Assessment and Plan  Dr. Elonda Husky in to speak with pt at her request and  will d/c home with reassurring FHR strip.  [place], [place] 11/25/2014, 10:42 PM

## 2014-11-25 NOTE — Discharge Instructions (Signed)
Braxton Hicks Contractions °Contractions of the uterus can occur throughout pregnancy. Contractions are not always a sign that you are in labor.  °WHAT ARE BRAXTON HICKS CONTRACTIONS?  °Contractions that occur before labor are called Braxton Hicks contractions, or false labor. Toward the end of pregnancy (32-34 weeks), these contractions can develop more often and may become more forceful. This is not true labor because these contractions do not result in opening (dilatation) and thinning of the cervix. They are sometimes difficult to tell apart from true labor because these contractions can be forceful and people have different pain tolerances. You should not feel embarrassed if you go to the hospital with false labor. Sometimes, the only way to tell if you are in true labor is for your health care provider to look for changes in the cervix. °If there are no prenatal problems or other health problems associated with the pregnancy, it is completely safe to be sent home with false labor and await the onset of true labor. °HOW CAN YOU TELL THE DIFFERENCE BETWEEN TRUE AND FALSE LABOR? °False Labor °· The contractions of false labor are usually shorter and not as hard as those of true labor.   °· The contractions are usually irregular.   °· The contractions are often felt in the front of the lower abdomen and in the groin.   °· The contractions may go away when you walk around or change positions while lying down.   °· The contractions get weaker and are shorter lasting as time goes on.   °· The contractions do not usually become progressively stronger, regular, and closer together as with true labor.   °True Labor °· Contractions in true labor last 30-70 seconds, become very regular, usually become more intense, and increase in frequency.   °· The contractions do not go away with walking.   °· The discomfort is usually felt in the top of the uterus and spreads to the lower abdomen and low back.   °· True labor can be  determined by your health care provider with an exam. This will show that the cervix is dilating and getting thinner.   °WHAT TO REMEMBER °· Keep up with your usual exercises and follow other instructions given by your health care provider.   °· Take medicines as directed by your health care provider.   °· Keep your regular prenatal appointments.   °· Eat and drink lightly if you think you are going into labor.   °· If Braxton Hicks contractions are making you uncomfortable:   °¨ Change your position from lying down or resting to walking, or from walking to resting.   °¨ Sit and rest in a tub of warm water.   °¨ Drink 2-3 glasses of water. Dehydration may cause these contractions.   °¨ Do slow and deep breathing several times an hour.   °WHEN SHOULD I SEEK IMMEDIATE MEDICAL CARE? °Seek immediate medical care if: °· Your contractions become stronger, more regular, and closer together.   °· You have fluid leaking or gushing from your vagina.   °· You have a fever.   °· You pass blood-tinged mucus.   °· You have vaginal bleeding.   °· You have continuous abdominal pain.   °· You have low back pain that you never had before.   °· You feel your baby's head pushing down and causing pelvic pressure.   °· Your baby is not moving as much as it used to.   °Document Released: 03/02/2005 Document Revised: 03/07/2013 Document Reviewed: 12/12/2012 °ExitCare® Patient Information ©2015 [employer]. This information is not intended to replace advice given to you by your health care   provider. Make sure you discuss any questions you have with your health care provider. ° °

## 2014-11-25 NOTE — MAU Note (Signed)
Pt states that she has had low blood sugars all day- ranging anywhere from 75-117. States she did not take her CBG around noon but states that she knew it was low because she felt dizzy and delusional. Has been eating pretty frequently today. Pt is GDM. Does not take medications or insulin. Still feels dizzy and hot but states she feels weird. Denies contraction or vag bleeding. Noticed decrease in FM in past 3 hours.

## 2014-11-27 ENCOUNTER — Ambulatory Visit (HOSPITAL_COMMUNITY): Payer: Medicaid Other

## 2014-11-28 ENCOUNTER — Other Ambulatory Visit: Payer: Self-pay | Admitting: Family Medicine

## 2014-11-28 ENCOUNTER — Ambulatory Visit (HOSPITAL_COMMUNITY)
Admission: RE | Admit: 2014-11-28 | Discharge: 2014-11-28 | Disposition: A | Discharge status: Home / Self Care | Payer: Medicaid Other | Source: Ambulatory Visit | Attending: Family | Admitting: Family

## 2014-11-28 ENCOUNTER — Encounter (HOSPITAL_COMMUNITY): Payer: Self-pay

## 2014-11-28 ENCOUNTER — Ambulatory Visit (INDEPENDENT_AMBULATORY_CARE_PROVIDER_SITE_OTHER): Payer: Medicaid Other | Admitting: Obstetrics & Gynecology

## 2014-11-28 VITALS — BP 126/86 | HR 95 | Wt 209.0 lb

## 2014-11-28 VITALS — BP 116/77 | HR 101 | Wt 208.0 lb

## 2014-11-28 DIAGNOSIS — O34219 Maternal care for unspecified type scar from previous cesarean delivery: Secondary | ICD-10-CM

## 2014-11-28 DIAGNOSIS — O24419 Gestational diabetes mellitus in pregnancy, unspecified control: Secondary | ICD-10-CM | POA: Insufficient documentation

## 2014-11-28 DIAGNOSIS — Z3A38 38 weeks gestation of pregnancy: Secondary | ICD-10-CM | POA: Diagnosis not present

## 2014-11-28 DIAGNOSIS — O3421 Maternal care for scar from previous cesarean delivery: Secondary | ICD-10-CM

## 2014-11-28 DIAGNOSIS — O24410 Gestational diabetes mellitus in pregnancy, diet controlled: Secondary | ICD-10-CM | POA: Diagnosis not present

## 2014-11-28 DIAGNOSIS — Z3A00 Weeks of gestation of pregnancy not specified: Secondary | ICD-10-CM | POA: Diagnosis not present

## 2014-11-28 NOTE — Progress Notes (Signed)
Subjective:  Stephanie Hopkins is a 29 y.o. G4P1021 at [redacted]w[redacted]d being seen today for ongoing prenatal care.  Patient reports no complaints.  Contractions: Irregular.  Vag. Bleeding: None. Movement: Present. Denies leaking of fluid.   The following portions of the patient's history were reviewed and updated as appropriate: allergies, current medications, past family history, past medical history, past social history, past surgical history and problem list.   Objective:   Filed Vitals:   11/28/14 1052  BP: 126/86  Pulse: 95  Weight: 209 lb (94.802 kg)    Fetal Status: Fetal Heart Rate (bpm): 133   Movement: Present     General:  Alert, oriented and cooperative. Patient is in no acute distress.  Skin: Skin is warm and dry. No rash noted.   Cardiovascular: Normal heart rate noted  Respiratory: Normal respiratory effort, no problems with respiration noted  Abdomen: Soft, gravid, appropriate for gestational age. Pain/Pressure: Present     Pelvic: Vag. Bleeding: None Vag D/C Character: Thin   Cervical exam deferred        Extremities: Normal range of motion.  Edema: Trace  Mental Status: Normal mood and affect. Normal behavior. Normal judgment and thought content.   Urinalysis: Urine Protein: Trace Urine Glucose: Negative  Assessment and Plan:  Pregnancy: G4P1021 at [redacted]w[redacted]d  1. Previous cesarean delivery affecting pregnancy, antepartum C/S scheduled for 39 weeks  2.  GDM Pt reports nml cbgs.  Log not available.  Term labor symptoms and general obstetric precautions including but not limited to vaginal bleeding, contractions, leaking of fluid and fetal movement were reviewed in detail with the patient. Please refer to After Visit Summary for other counseling recommendations.  Return in about 7 weeks (around 01/16/2015).   Guss Bunde, MD

## 2014-12-03 ENCOUNTER — Encounter (HOSPITAL_COMMUNITY): Payer: Self-pay

## 2014-12-03 ENCOUNTER — Encounter (HOSPITAL_COMMUNITY)
Admission: RE | Admit: 2014-12-03 | Discharge: 2014-12-03 | Disposition: A | Discharge status: Home / Self Care | Payer: Medicaid Other | Source: Ambulatory Visit | Attending: Obstetrics & Gynecology | Admitting: Obstetrics & Gynecology

## 2014-12-03 VITALS — BP 102/79 | HR 99 | Temp 97.3°F | Resp 18 | Ht 64.0 in | Wt 210.0 lb

## 2014-12-03 DIAGNOSIS — O24419 Gestational diabetes mellitus in pregnancy, unspecified control: Secondary | ICD-10-CM

## 2014-12-03 DIAGNOSIS — Z8659 Personal history of other mental and behavioral disorders: Secondary | ICD-10-CM

## 2014-12-03 DIAGNOSIS — O99891 Other specified diseases and conditions complicating pregnancy: Secondary | ICD-10-CM

## 2014-12-03 DIAGNOSIS — O9989 Other specified diseases and conditions complicating pregnancy, childbirth and the puerperium: Secondary | ICD-10-CM

## 2014-12-03 HISTORY — DX: Gastro-esophageal reflux disease without esophagitis: K21.9

## 2014-12-03 LAB — BASIC METABOLIC PANEL
ANION GAP: 8 (ref 5–15)
BUN: 9 mg/dL (ref 6–20)
CALCIUM: 8.7 mg/dL — AB (ref 8.9–10.3)
CO2: 22 mmol/L (ref 22–32)
CREATININE: 0.49 mg/dL (ref 0.44–1.00)
Chloride: 104 mmol/L (ref 101–111)
Glucose, Bld: 97 mg/dL (ref 65–99)
Potassium: 4.1 mmol/L (ref 3.5–5.1)
SODIUM: 134 mmol/L — AB (ref 135–145)

## 2014-12-03 LAB — CBC
HEMATOCRIT: 39.2 % (ref 36.0–46.0)
HEMOGLOBIN: 13.1 g/dL (ref 12.0–15.0)
MCH: 29.4 pg (ref 26.0–34.0)
MCHC: 33.4 g/dL (ref 30.0–36.0)
MCV: 88.1 fL (ref 78.0–100.0)
Platelets: 252 10*3/uL (ref 150–400)
RBC: 4.45 MIL/uL (ref 3.87–5.11)
RDW: 14.1 % (ref 11.5–15.5)
WBC: 8.8 10*3/uL (ref 4.0–10.5)

## 2014-12-03 LAB — TYPE AND SCREEN
ABO/RH(D): A POS
ANTIBODY SCREEN: NEGATIVE

## 2014-12-03 NOTE — Patient Instructions (Addendum)
Your procedure is scheduled on:  December 04, 2014  Enter through the Main Entrance of [facility] at: 7:30 am   Pick up the phone at the desk and dial [phone].  Call this number if you have problems the morning of surgery: [phone].  Remember: Do NOT eat food: after midnight tonight  Do NOT drink clear liquids after:  Midnight tonight  Take these medicines the morning of surgery with a SIP OF WATER:  Zantac   Do NOT wear jewelry (body piercing), metal hair clips/bobby pins,or nail polish. Do NOT wear lotions, powders, or perfumes.  You may wear deoderant. Do NOT shave for 48 hours prior to surgery. Do NOT bring valuables to the hospital. Leave suitcase in car.  After surgery it may be brought to your room.  For patients admitted to the hospital, checkout time is 11:00 AM the day of discharge.

## 2014-12-04 ENCOUNTER — Encounter (HOSPITAL_COMMUNITY)
Admission: AD | Disposition: A | Discharge status: Home / Self Care | Payer: Self-pay | Source: Ambulatory Visit | Attending: Obstetrics & Gynecology

## 2014-12-04 ENCOUNTER — Inpatient Hospital Stay (HOSPITAL_COMMUNITY)
Admission: AD | Admit: 2014-12-04 | Discharge: 2014-12-06 | DRG: 765 | Disposition: A | Discharge status: Home / Self Care | Payer: Medicaid Other | Source: Ambulatory Visit | Attending: Obstetrics & Gynecology | Admitting: Obstetrics & Gynecology

## 2014-12-04 ENCOUNTER — Inpatient Hospital Stay (HOSPITAL_COMMUNITY): Payer: Medicaid Other | Admitting: Anesthesiology

## 2014-12-04 ENCOUNTER — Encounter (HOSPITAL_COMMUNITY): Payer: Self-pay

## 2014-12-04 DIAGNOSIS — Z87891 Personal history of nicotine dependence: Secondary | ICD-10-CM

## 2014-12-04 DIAGNOSIS — O9962 Diseases of the digestive system complicating childbirth: Secondary | ICD-10-CM | POA: Diagnosis present

## 2014-12-04 DIAGNOSIS — O24420 Gestational diabetes mellitus in childbirth, diet controlled: Secondary | ICD-10-CM | POA: Diagnosis present

## 2014-12-04 DIAGNOSIS — Z8249 Family history of ischemic heart disease and other diseases of the circulatory system: Secondary | ICD-10-CM | POA: Diagnosis not present

## 2014-12-04 DIAGNOSIS — K219 Gastro-esophageal reflux disease without esophagitis: Secondary | ICD-10-CM | POA: Diagnosis present

## 2014-12-04 DIAGNOSIS — O99891 Other specified diseases and conditions complicating pregnancy: Secondary | ICD-10-CM

## 2014-12-04 DIAGNOSIS — Z98891 History of uterine scar from previous surgery: Secondary | ICD-10-CM

## 2014-12-04 DIAGNOSIS — O3421 Maternal care for scar from previous cesarean delivery: Secondary | ICD-10-CM | POA: Diagnosis not present

## 2014-12-04 DIAGNOSIS — N858 Other specified noninflammatory disorders of uterus: Secondary | ICD-10-CM

## 2014-12-04 DIAGNOSIS — O24424 Gestational diabetes mellitus in childbirth, insulin controlled: Secondary | ICD-10-CM

## 2014-12-04 DIAGNOSIS — Z8659 Personal history of other mental and behavioral disorders: Secondary | ICD-10-CM

## 2014-12-04 DIAGNOSIS — Z3A39 39 weeks gestation of pregnancy: Secondary | ICD-10-CM

## 2014-12-04 DIAGNOSIS — O24419 Gestational diabetes mellitus in pregnancy, unspecified control: Secondary | ICD-10-CM

## 2014-12-04 DIAGNOSIS — O2653 Maternal hypotension syndrome, third trimester: Secondary | ICD-10-CM | POA: Diagnosis present

## 2014-12-04 DIAGNOSIS — O9989 Other specified diseases and conditions complicating pregnancy, childbirth and the puerperium: Secondary | ICD-10-CM

## 2014-12-04 LAB — GLUCOSE, CAPILLARY
GLUCOSE-CAPILLARY: 91 mg/dL (ref 65–99)
Glucose-Capillary: 82 mg/dL (ref 65–99)

## 2014-12-04 LAB — RPR: RPR: NONREACTIVE

## 2014-12-04 SURGERY — Surgical Case
Anesthesia: Spinal | Site: Abdomen

## 2014-12-04 MED ORDER — OXYCODONE-ACETAMINOPHEN 5-325 MG PO TABS
2.0000 | ORAL_TABLET | ORAL | Status: DC | PRN
  Administered 2014-12-05 – 2014-12-06 (×9): 2 via ORAL
  Filled 2014-12-04 (×8): qty 2

## 2014-12-04 MED ORDER — ACETAMINOPHEN 325 MG PO TABS: 650.0000 mg | ORAL_TABLET | ORAL | Status: DC | PRN

## 2014-12-04 MED ORDER — NALOXONE HCL 0.4 MG/ML IJ SOLN: 0.4000 mg | INTRAMUSCULAR | Status: DC | PRN

## 2014-12-04 MED ORDER — FENTANYL CITRATE (PF) 100 MCG/2ML IJ SOLN
INTRAMUSCULAR | Status: AC
  Filled 2014-12-04: qty 2

## 2014-12-04 MED ORDER — KETOROLAC TROMETHAMINE 30 MG/ML IJ SOLN
30.0000 mg | Freq: Four times a day (QID) | INTRAMUSCULAR | Status: AC | PRN
  Administered 2014-12-04: 30 mg via INTRAMUSCULAR

## 2014-12-04 MED ORDER — ONDANSETRON HCL 4 MG/2ML IJ SOLN: 4.0000 mg | Freq: Three times a day (TID) | INTRAMUSCULAR | Status: DC | PRN

## 2014-12-04 MED ORDER — FENTANYL CITRATE (PF) 100 MCG/2ML IJ SOLN
INTRAMUSCULAR | Status: DC | PRN
  Administered 2014-12-04: 10 ug via INTRATHECAL

## 2014-12-04 MED ORDER — BUPIVACAINE IN DEXTROSE 0.75-8.25 % IT SOLN
INTRATHECAL | Status: DC | PRN
  Administered 2014-12-04: 1.6 mL via INTRATHECAL

## 2014-12-04 MED ORDER — LANOLIN HYDROUS EX OINT: 1.0000 "application " | TOPICAL_OINTMENT | CUTANEOUS | Status: DC | PRN

## 2014-12-04 MED ORDER — MORPHINE SULFATE (PF) 0.5 MG/ML IJ SOLN
INTRAMUSCULAR | Status: AC
Start: 2014-12-04 — End: 2014-12-04
  Filled 2014-12-04: qty 100

## 2014-12-04 MED ORDER — DIPHENHYDRAMINE HCL 50 MG/ML IJ SOLN: 12.5000 mg | INTRAMUSCULAR | Status: DC | PRN

## 2014-12-04 MED ORDER — NALBUPHINE HCL 10 MG/ML IJ SOLN
5.0000 mg | INTRAMUSCULAR | Status: DC | PRN
  Filled 2014-12-04: qty 0.5

## 2014-12-04 MED ORDER — CEFAZOLIN SODIUM-DEXTROSE 2-3 GM-% IV SOLR
2.0000 g | INTRAVENOUS | Status: AC
  Administered 2014-12-04: 2 g via INTRAVENOUS

## 2014-12-04 MED ORDER — OXYTOCIN 10 UNIT/ML IJ SOLN
40.0000 [IU] | INTRAMUSCULAR | Status: DC | PRN
  Administered 2014-12-04: 40 [IU] via INTRAVENOUS

## 2014-12-04 MED ORDER — WITCH HAZEL-GLYCERIN EX PADS: 1.0000 "application " | MEDICATED_PAD | CUTANEOUS | Status: DC | PRN

## 2014-12-04 MED ORDER — LACTATED RINGERS IV SOLN: Freq: Once | INTRAVENOUS | Status: DC

## 2014-12-04 MED ORDER — BUPIVACAINE HCL (PF) 0.5 % IJ SOLN
INTRAMUSCULAR | Status: DC | PRN
  Administered 2014-12-04: 30 mL

## 2014-12-04 MED ORDER — SCOPOLAMINE 1 MG/3DAYS TD PT72
MEDICATED_PATCH | TRANSDERMAL | Status: AC
  Filled 2014-12-04: qty 1

## 2014-12-04 MED ORDER — SIMETHICONE 80 MG PO CHEW
80.0000 mg | CHEWABLE_TABLET | ORAL | Status: DC
  Administered 2014-12-04 – 2014-12-05 (×2): 80 mg via ORAL
  Filled 2014-12-04 (×2): qty 1

## 2014-12-04 MED ORDER — MENTHOL 3 MG MT LOZG: 1.0000 | LOZENGE | OROMUCOSAL | Status: DC | PRN

## 2014-12-04 MED ORDER — PROMETHAZINE HCL 25 MG/ML IJ SOLN
INTRAMUSCULAR | Status: AC
  Filled 2014-12-04: qty 1

## 2014-12-04 MED ORDER — PHENYLEPHRINE 8 MG IN D5W 100 ML (0.08MG/ML) PREMIX OPTIME
INJECTION | INTRAVENOUS | Status: AC
  Filled 2014-12-04: qty 100

## 2014-12-04 MED ORDER — FENTANYL CITRATE (PF) 100 MCG/2ML IJ SOLN
INTRAMUSCULAR | Status: AC
Start: 2014-12-04 — End: 2014-12-04
  Filled 2014-12-04: qty 4

## 2014-12-04 MED ORDER — OXYTOCIN 10 UNIT/ML IJ SOLN
INTRAMUSCULAR | Status: AC
  Filled 2014-12-04: qty 4

## 2014-12-04 MED ORDER — SIMETHICONE 80 MG PO CHEW
80.0000 mg | CHEWABLE_TABLET | Freq: Three times a day (TID) | ORAL | Status: DC
  Administered 2014-12-04 – 2014-12-06 (×6): 80 mg via ORAL
  Filled 2014-12-04 (×5): qty 1

## 2014-12-04 MED ORDER — PHENYLEPHRINE 8 MG IN D5W 100 ML (0.08MG/ML) PREMIX OPTIME
INJECTION | INTRAVENOUS | Status: DC | PRN
  Administered 2014-12-04: 60 ug/min via INTRAVENOUS

## 2014-12-04 MED ORDER — SIMETHICONE 80 MG PO CHEW: 80.0000 mg | CHEWABLE_TABLET | ORAL | Status: DC | PRN

## 2014-12-04 MED ORDER — SENNOSIDES-DOCUSATE SODIUM 8.6-50 MG PO TABS
2.0000 | ORAL_TABLET | ORAL | Status: DC
  Administered 2014-12-04 – 2014-12-06 (×2): 2 via ORAL
  Filled 2014-12-04 (×2): qty 2

## 2014-12-04 MED ORDER — IBUPROFEN 600 MG PO TABS
600.0000 mg | ORAL_TABLET | Freq: Four times a day (QID) | ORAL | Status: DC
  Administered 2014-12-04 – 2014-12-06 (×7): 600 mg via ORAL
  Filled 2014-12-04 (×7): qty 1

## 2014-12-04 MED ORDER — DIPHENHYDRAMINE HCL 25 MG PO CAPS: 25.0000 mg | ORAL_CAPSULE | Freq: Four times a day (QID) | ORAL | Status: DC | PRN

## 2014-12-04 MED ORDER — PROMETHAZINE HCL 25 MG/ML IJ SOLN
6.2500 mg | INTRAMUSCULAR | Status: DC | PRN
  Administered 2014-12-04: 6.25 mg via INTRAVENOUS

## 2014-12-04 MED ORDER — BUPIVACAINE HCL (PF) 0.5 % IJ SOLN
INTRAMUSCULAR | Status: AC
  Filled 2014-12-04: qty 30

## 2014-12-04 MED ORDER — FENTANYL CITRATE (PF) 100 MCG/2ML IJ SOLN
25.0000 ug | Freq: Once | INTRAMUSCULAR | Status: AC
  Administered 2014-12-04: 25 ug via INTRAVENOUS

## 2014-12-04 MED ORDER — 0.9 % SODIUM CHLORIDE (POUR BTL) OPTIME
TOPICAL | Status: DC | PRN
  Administered 2014-12-04: 1000 mL

## 2014-12-04 MED ORDER — ONDANSETRON HCL 4 MG/2ML IJ SOLN
INTRAMUSCULAR | Status: AC
  Filled 2014-12-04: qty 2

## 2014-12-04 MED ORDER — DIPHENHYDRAMINE HCL 25 MG PO CAPS: 25.0000 mg | ORAL_CAPSULE | ORAL | Status: DC | PRN

## 2014-12-04 MED ORDER — KETOROLAC TROMETHAMINE 30 MG/ML IJ SOLN
INTRAMUSCULAR | Status: AC
  Filled 2014-12-04: qty 1

## 2014-12-04 MED ORDER — PRENATAL MULTIVITAMIN CH
1.0000 | ORAL_TABLET | Freq: Every day | ORAL | Status: DC
  Administered 2014-12-05 (×2): 1 via ORAL
  Filled 2014-12-04: qty 1

## 2014-12-04 MED ORDER — CITRIC ACID-SODIUM CITRATE 334-500 MG/5ML PO SOLN
ORAL | Status: AC
  Filled 2014-12-04: qty 15

## 2014-12-04 MED ORDER — OXYCODONE-ACETAMINOPHEN 5-325 MG PO TABS
1.0000 | ORAL_TABLET | ORAL | Status: DC | PRN
  Administered 2014-12-04: 1 via ORAL
  Filled 2014-12-04 (×2): qty 1

## 2014-12-04 MED ORDER — NALBUPHINE HCL 10 MG/ML IJ SOLN
5.0000 mg | Freq: Once | INTRAMUSCULAR | Status: DC | PRN
  Filled 2014-12-04: qty 0.5

## 2014-12-04 MED ORDER — CEFAZOLIN SODIUM-DEXTROSE 2-3 GM-% IV SOLR
INTRAVENOUS | Status: AC
  Filled 2014-12-04: qty 50

## 2014-12-04 MED ORDER — DIBUCAINE 1 % RE OINT: 1.0000 "application " | TOPICAL_OINTMENT | RECTAL | Status: DC | PRN

## 2014-12-04 MED ORDER — LACTATED RINGERS IV SOLN
INTRAVENOUS | Status: DC
  Administered 2014-12-05: 02:00:00 via INTRAVENOUS

## 2014-12-04 MED ORDER — TETANUS-DIPHTH-ACELL PERTUSSIS 5-2.5-18.5 LF-MCG/0.5 IM SUSP: 0.5000 mL | Freq: Once | INTRAMUSCULAR | Status: DC

## 2014-12-04 MED ORDER — KETOROLAC TROMETHAMINE 30 MG/ML IJ SOLN: 30.0000 mg | Freq: Four times a day (QID) | INTRAMUSCULAR | Status: AC | PRN

## 2014-12-04 MED ORDER — CITRIC ACID-SODIUM CITRATE 334-500 MG/5ML PO SOLN
30.0000 mL | Freq: Once | ORAL | Status: AC
  Administered 2014-12-04: 30 mL via ORAL

## 2014-12-04 MED ORDER — NALOXONE HCL 1 MG/ML IJ SOLN
1.0000 ug/kg/h | INTRAVENOUS | Status: DC | PRN
  Filled 2014-12-04: qty 2

## 2014-12-04 MED ORDER — SCOPOLAMINE 1 MG/3DAYS TD PT72
1.0000 | MEDICATED_PATCH | Freq: Once | TRANSDERMAL | Status: DC
  Administered 2014-12-04: 1.5 mg via TRANSDERMAL

## 2014-12-04 MED ORDER — ZOLPIDEM TARTRATE 5 MG PO TABS: 5.0000 mg | ORAL_TABLET | Freq: Every evening | ORAL | Status: DC | PRN

## 2014-12-04 MED ORDER — NALBUPHINE HCL 10 MG/ML IJ SOLN
5.0000 mg | Freq: Once | INTRAMUSCULAR | Status: DC | PRN
Start: 2014-12-04 — End: 2014-12-06
  Filled 2014-12-04: qty 0.5

## 2014-12-04 MED ORDER — OXYTOCIN 40 UNITS IN LACTATED RINGERS INFUSION - SIMPLE MED
62.5000 mL/h | INTRAVENOUS | Status: AC
Start: 2014-12-04 — End: 2014-12-05

## 2014-12-04 MED ORDER — MEPERIDINE HCL 25 MG/ML IJ SOLN: 6.2500 mg | INTRAMUSCULAR | Status: DC | PRN

## 2014-12-04 MED ORDER — ACETAMINOPHEN 500 MG PO TABS
1000.0000 mg | ORAL_TABLET | Freq: Four times a day (QID) | ORAL | Status: AC
Start: 2014-12-04 — End: 2014-12-05
  Administered 2014-12-04: 1000 mg via ORAL
  Filled 2014-12-04: qty 2

## 2014-12-04 MED ORDER — LACTATED RINGERS IV SOLN
INTRAVENOUS | Status: DC
  Administered 2014-12-04 (×4): via INTRAVENOUS

## 2014-12-04 MED ORDER — MORPHINE SULFATE (PF) 0.5 MG/ML IJ SOLN
INTRAMUSCULAR | Status: DC | PRN
  Administered 2014-12-04: .2 mg via INTRATHECAL

## 2014-12-04 MED ORDER — SODIUM CHLORIDE 0.9 % IJ SOLN: 3.0000 mL | INTRAMUSCULAR | Status: DC | PRN

## 2014-12-04 SURGICAL SUPPLY — 29 items
BENZOIN TINCTURE PRP APPL 2/3 (GAUZE/BANDAGES/DRESSINGS) ×3 IMPLANT
CLAMP CORD UMBIL (MISCELLANEOUS) ×3 IMPLANT
CLOSURE WOUND 1/2 X4 (GAUZE/BANDAGES/DRESSINGS) ×1
CLOTH BEACON ORANGE TIMEOUT ST (SAFETY) ×3 IMPLANT
DRAPE SHEET LG 3/4 BI-LAMINATE (DRAPES) ×3 IMPLANT
DRSG OPSITE POSTOP 4X10 (GAUZE/BANDAGES/DRESSINGS) ×3 IMPLANT
DRSG TELFA 3X8 NADH (GAUZE/BANDAGES/DRESSINGS) ×3 IMPLANT
DURAPREP 26ML APPLICATOR (WOUND CARE) ×3 IMPLANT
ELECT REM PT RETURN 9FT ADLT (ELECTROSURGICAL) ×3
ELECTRODE REM PT RTRN 9FT ADLT (ELECTROSURGICAL) ×1 IMPLANT
GLOVE BIO SURGEON STRL SZ7 (GLOVE) ×3 IMPLANT
GLOVE BIOGEL PI IND STRL 7.0 (GLOVE) ×1 IMPLANT
GLOVE BIOGEL PI INDICATOR 7.0 (GLOVE) ×2
GOWN STRL REUS W/TWL LRG LVL3 (GOWN DISPOSABLE) ×6 IMPLANT
GOWN STRL REUS W/TWL XL LVL3 (GOWN DISPOSABLE) ×3 IMPLANT
NEEDLE HYPO 22GX1.5 SAFETY (NEEDLE) ×3 IMPLANT
NS IRRIG 1000ML POUR BTL (IV SOLUTION) ×3 IMPLANT
PACK C SECTION WH (CUSTOM PROCEDURE TRAY) ×3 IMPLANT
PAD OB MATERNITY 4.3X12.25 (PERSONAL CARE ITEMS) ×3 IMPLANT
PENCIL SMOKE EVAC W/HOLSTER (ELECTROSURGICAL) ×3 IMPLANT
RTRCTR C-SECT PINK 25CM LRG (MISCELLANEOUS) ×3 IMPLANT
STRIP CLOSURE SKIN 1/2X4 (GAUZE/BANDAGES/DRESSINGS) ×2 IMPLANT
SUT VIC AB 0 CT1 36 (SUTURE) ×9 IMPLANT
SUT VIC AB 3-0 CT1 27 (SUTURE) ×2
SUT VIC AB 3-0 CT1 TAPERPNT 27 (SUTURE) ×1 IMPLANT
SUT VIC AB 4-0 KS 27 (SUTURE) ×3 IMPLANT
SYR CONTROL 10ML LL (SYRINGE) ×3 IMPLANT
TOWEL OR 17X24 6PK STRL BLUE (TOWEL DISPOSABLE) ×3 IMPLANT
TRAY FOLEY CATH SILVER 14FR (SET/KITS/TRAYS/PACK) ×3 IMPLANT

## 2014-12-04 NOTE — Anesthesia Preprocedure Evaluation (Addendum)
Anesthesia Evaluation  Patient identified by MRN, date of birth, ID band Patient awake    Reviewed: Allergy & Precautions, NPO status , Patient's Chart, lab work & pertinent test results  History of Anesthesia Complications Negative for: history of anesthetic complications  Airway Mallampati: II  TM Distance: >3 FB Neck ROM: Full    Dental  (+) Teeth Intact, Dental Advisory Given   Pulmonary former smoker,    Pulmonary exam normal breath sounds clear to auscultation       Cardiovascular Exercise Tolerance: Good (-) hypertension(-) angina(-) Past MI negative cardio ROS Normal cardiovascular exam Rhythm:Regular Rate:Normal     Neuro/Psych negative neurological ROS  negative psych ROS   GI/Hepatic Neg liver ROS, GERD  Medicated,  Endo/Other  diabetes (GDM-diet controlled), Well Controlled, GestationalObesity   Renal/GU negative Renal ROS     Musculoskeletal negative musculoskeletal ROS (+)   Abdominal   Peds  Hematology negative hematology ROS (+)   Anesthesia Other Findings Day of surgery medications reviewed with the patient.  Reproductive/Obstetrics (+) Pregnancy                            Anesthesia Physical Anesthesia Plan  ASA: II  Anesthesia Plan: Spinal   Post-op Pain Management:    Induction:   Airway Management Planned:   Additional Equipment:   Intra-op Plan:   Post-operative Plan:   Informed Consent: I have reviewed the patients History and Physical, chart, labs and discussed the procedure including the risks, benefits and alternatives for the proposed anesthesia with the patient or authorized representative who has indicated his/her understanding and acceptance.   Dental advisory given  Plan Discussed with: CRNA  Anesthesia Plan Comments: (Discussed risks and benefits of and differences between spinal and general. Discussed risks of spinal including headache,  backache, failure, bleeding, infection, and nerve damage. Patient consents to spinal. Questions answered. Coagulation studies and platelet count acceptable.)        Anesthesia Quick Evaluation

## 2014-12-04 NOTE — Anesthesia Postprocedure Evaluation (Signed)
  Anesthesia Post-op Note  Patient: Stephanie Hopkins  Procedure(s) Performed: Procedure(s) with comments: CESAREAN SECTION (N/A) - Requested 12/03/14 @4 :00p  Patient Location: PACU  Anesthesia Type:Spinal  Level of Consciousness: awake, alert , oriented and patient cooperative  Airway and Oxygen Therapy: Patient Spontanous Breathing  Post-op Pain: mild  Post-op Assessment: Post-op Vital signs reviewed, Patient's Cardiovascular Status Stable, Respiratory Function Stable, Patent Airway, No signs of Nausea or vomiting, Pain level controlled, No headache, No backache, Spinal receding and Patient able to bend at knees LLE Motor Response: Purposeful movement LLE Sensation: Increased RLE Motor Response: Purposeful movement RLE Sensation: Increased      Post-op Vital Signs: Reviewed and stable  Last Vitals:  Filed Vitals:   12/04/14 1337  BP: 97/57  Pulse: 48  Temp: 36.3 C  Resp: 18    Complications: No apparent anesthesia complications

## 2014-12-04 NOTE — Op Note (Signed)
Cesarean Section Operative Report  Stephanie Hopkins  12/04/2014  Indications: Scheduled Proceedure/Maternal Request   Pre-operative Diagnosis: cpt 33825 - REPEAT c-section.   Post-operative Diagnosis: Same   Surgeon: Surgeon(s) and Role:    * Lavonia Drafts, MD - Primary    * Gwynne Edinger, MD - Assisting   Attending Attestation: I was present and scrubbed for the entire procedure.   Assistants: none  Anesthesia: spinal    Estimated Blood Loss: 500 ml  Total IV Fluids: 3400 ml LR   Urine Output: 400 cc clear yellow   Specimens: none  Findings: Viable female infant in cephalic presentation. Apgars 8/9, weight 3410 g. Arterial cord pH not obtained. Clear amniotic fluid. Intact placenta, three vessel cord. Normal uterus, fallopian tubes and ovaries bilaterally.  Baby condition / location:  Couplet care / Skin to Skin    Complications: no complications  Indications: Stephanie Hopkins is a 22 y.o. K5L9767 with an IUP [redacted]w[redacted]d presenting for scheduled repeat cesarean section. History A1GDM this pregnancy, EFW 76th percentile at 38+2.  The risks, benefits, complications, treatment options, and expected outcomes were discussed with the patient . The patient concurred with the proposed plan, giving informed consent. identified as Stephanie Hopkins and the procedure verified as C-Section Delivery.  Procedure Details: A Time Out was held and the above information confirmed.  The patient was taken back to the operative suite where spinal anesthesia was placed.  After induction of anesthesia, the patient was draped and prepped in the usual sterile manner and placed in a dorsal supine position with a leftward tilt. A Phannensteil incision was made and carried down through the subcutaneous tissue to the fascia. Fascial incision was made and extended transversely. The fascia was separated from the underlying rectus tissue superiorly and inferiorly. The peritoneum was identified and entered  bluntly and extended longitudinally.  A low transverse uterine incision was made. Delivered from cephalic presentation was a 3410 gram Living newborn infant(s) with Apgar scores of 8 at one minute and 9 at five minutes. Cord ph was not sent the umbilical cord was clamped and cut cord blood was obtained for evaluation. The placenta was removed Intact and appeared normal. The uterine outline, tubes and ovaries appeared normal}. The uterine incision was closed with running locked sutures of 0Vicryl with a figure-of-eight placed at the right [facility] with 0Vicryl.  Hemostasis was observed. Gutters were examined bilaterally and cleared of blood clot. The peritoneum and rectus muscles were loosely reapproximated with one interrupted suture of 2-0 gut. The fascia was then reapproximated with running sutures of 0Vicryl. No indication for subcuticular closure. The skin was closed with 4-0Vicryl.   Instrument, sponge, and needle counts were correct prior the abdominal closure and were correct at the conclusion of the case.     Disposition: PACU - hemodynamically stable.   Maternal Condition: stable       Signed: Ennis Forts 12/04/2014 10:24 AM

## 2014-12-04 NOTE — H&P (Signed)
LABOR AND DELIVERY ADMISSION HISTORY AND PHYSICAL NOTE  Stephanie Hopkins is a 29 y.o. female [phone] with IUP at [redacted]w[redacted]d by L/19 presenting for scheduled repeat cesarean section. She reports +FMs, No LOF, no VB, no blurry vision, headaches or peripheral edema, and RUQ pain.   Growth scan at 38+2 showed EFW 76th percentile   Prenatal History/Complications:  Past Medical History: Past Medical History  Diagnosis Date  . Supervision of normal pregnancy 09/14/2011    [facility] (transfer from [place]) Genetic Screen  Reports negative  Anatomic [place]  Normal @ 20 wks; poor visual of spine>rescan nml  Glucose Screen 150 >> needs 3 hr GTT  GBS   Feeding Preference  Breast  Contraception   Circumcision       . Colitis   . Gestational diabetes   . GERD (gastroesophageal reflux disease)     Zantac      Past Surgical History: Past Surgical History  Procedure Laterality Date  . 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 Cesarean section  01/30/2012    Procedure: CESAREAN SECTION;  Surgeon: Jonnie Kind, MD;  Location: [facility];  Service: Gynecology;  Laterality: N/A;  Primary Cesarean Section Delivery Baby Boy @ [phone], Apgars 4/9     Obstetrical History: OB History    Gravida Para Term Preterm AB TAB SAB Ectopic Multiple Living   4 1 1  2  1   1       Obstetric Comments   Gestational Diabetes       Social History: Social History   Social History  . Marital Status: Married    Spouse Name: N/A  . Number of Children: N/A  . Years of Education: N/A   Occupational History  .      Homemaker   Social History Main Topics  . Smoking status: Former Research scientist (life sciences)  . Smokeless tobacco: Never Used  . Alcohol Use: No  . Drug Use: No  . Sexual Activity: Yes    Birth Control/ Protection: None   Other Topics Concern  . None   Social History Narrative    Family History: Family History  Problem Relation Age of Onset  . Hypertension Mother      Allergies: Allergies  Allergen Reactions  . Sulfa Antibiotics Rash  . Adhesive [Tape] Rash  . Bactrim [Sulfamethoxazole-Trimethoprim] Rash    Prescriptions prior to admission  Medication Sig Dispense Refill Last Dose  . acetaminophen (TYLENOL) 500 MG tablet Take 1,000 mg by mouth every 6 (six) hours as needed for mild pain or headache.    12/03/2014 at Unknown time  . cetirizine (ZYRTEC) 10 MG tablet Take 10 mg by mouth daily.   Past Week at Unknown time  . diphenhydrAMINE (BENADRYL) 25 MG tablet Take 25 mg by mouth every 6 (six) hours as needed.   12/03/2014 at Unknown time  . Prenatal Vit-Fe Fumarate-FA (MULTIVITAMIN-PRENATAL) 27-0.8 MG TABS Take 1 tablet by mouth daily.   Past Week at Unknown time  . ranitidine (ZANTAC) 150 MG tablet Take 150 mg by mouth 2 (two) times daily as needed for heartburn.   12/04/2014 at 0620  . ACCU-CHEK FASTCLIX LANCETS MISC 1 Device by Percutaneous route 4 (four) times daily. 100 each 12 Taking  . glucose blood test strip Accu check advantage test strips use as instructed for testing four time daily. ICD-10:O24.419 100 each 12 Taking  . zolpidem (AMBIEN CR) 6.25 MG CR tablet Take 1 tablet (6.25  mg total) by mouth at bedtime as needed for sleep. (Patient not taking: Reported on 11/28/2014) 15 tablet 0 Not Taking     Review of Systems   All systems reviewed and negative except as stated in HPI  Blood pressure 107/78, pulse 84, temperature 97.5 F (36.4 C), temperature source Oral, resp. rate 18, last menstrual period 03/05/2014, SpO2 98 %, unknown if currently breastfeeding. General appearance: alert, cooperative and appears stated age Lungs: clear to auscultation bilaterally Heart: regular rate and rhythm Abdomen: soft, non-tender; bowel sounds normal Extremities: Homans sign is negative, no sign of DVT Presentation: cephalic    Prenatal labs: ABO, Rh: --/--/A POS (09/19 1145) Antibody: NEG (09/19 1145) Rubella:  immune RPR: Non Reactive  (09/19 1145)  HBsAg: NEGATIVE (04/22 1004)  HIV: NONREACTIVE (07/06 1023)  GBS:   negative 1 hr Glucola 147, 3-hour 79, 212, 170, 99 Genetic screening  declined Anatomy US normal save for echogenic intracardiac focus    Prenatal Transfer Tool  Maternal Diabetes: Yes:  Diabetes Type:  Diet controlled Genetic Screening: Declined Maternal Ultrasounds/Referrals: Normal save for echogenic intracardiac focus Fetal Ultrasounds or other Referrals:  None Maternal Substance Abuse:  No Significant Maternal Medications:  None Significant Maternal Lab Results: Lab values include: Group B Strep negative  Results for orders placed or performed during the hospital encounter of 12/04/14 (from the past 24 hour(s))  Glucose, capillary   Collection Time: 12/04/14  7:44 AM  Result Value Ref Range   Glucose-Capillary 91 65 - 99 mg/dL  Results for orders placed or performed during the hospital encounter of 12/03/14 (from the past 24 hour(s))  CBC   Collection Time: 12/03/14 11:45 AM  Result Value Ref Range   WBC 8.8 4.0 - 10.5 K/uL   RBC 4.45 3.87 - 5.11 MIL/uL   Hemoglobin 13.1 12.0 - 15.0 g/dL   HCT 39.2 36.0 - 46.0 %   MCV 88.1 78.0 - 100.0 fL   MCH 29.4 26.0 - 34.0 pg   MCHC 33.4 30.0 - 36.0 g/dL   RDW 14.1 11.5 - 15.5 %   Platelets 252 150 - 400 K/uL  RPR   Collection Time: 12/03/14 11:45 AM  Result Value Ref Range   RPR Ser Ql Non Reactive Non Reactive  Basic metabolic panel   Collection Time: 12/03/14 11:45 AM  Result Value Ref Range   Sodium 134 (L) 135 - 145 mmol/L   Potassium 4.1 3.5 - 5.1 mmol/L   Chloride 104 101 - 111 mmol/L   CO2 22 22 - 32 mmol/L   Glucose, Bld 97 65 - 99 mg/dL   BUN 9 6 - 20 mg/dL   Creatinine, Ser 0.49 0.44 - 1.00 mg/dL   Calcium 8.7 (L) 8.9 - 10.3 mg/dL   GFR calc non Af Amer >60 >60 mL/min   GFR calc Af Amer >60 >60 mL/min   Anion gap 8 5 - 15  Type and screen   Collection Time: 12/03/14 11:45 AM  Result Value Ref Range   ABO/RH(D) A POS     Antibody Screen NEG    Sample Expiration 12/06/2014     Patient Active Problem List   Diagnosis Date Noted  . Gestational diabetes mellitus, antepartum 09/25/2014  . History of postpartum depression, currently pregnant 07/06/2014  . Previous cesarean delivery affecting pregnancy, antepartum 08/10/2013    Assessment: Stephanie Hopkins is a 58 y.o. G4P1021 at [redacted]w[redacted]d here for scheduled repeat cesarean section  #ID:  GBS neg #MOF: breast #MOC: likely LARC, considering  Mirena #Circ:  N/a #A1GDM: EFW 76th percentile at 38+2, will check fasting glucose POD 1 or 2  Noah B Wouk 12/04/2014, 8:57 AM

## 2014-12-04 NOTE — Lactation Note (Signed)
This note was copied from the chart of Stephanie Hopkins. Lactation Consultation Note  Patient Name: Stephanie Hopkins [id number]'[id number] Date: 12/04/2014 Reason for consult: Initial assessment  Baby 86 hours old. Mom reports that she nursed first child 14 months without any problems. Mom states that this baby is latching and nursing well and she does not need any assistance with BF. Mom given [facility] brochure, aware of OP/BFSG, community resources, and [facility] phone line assistance after D/C.  Maternal Data Does the patient have breastfeeding experience prior to this delivery?: Yes  Feeding Feeding Type: Breast Fed Length of feed: 20 min  LATCH Score/Interventions                      Lactation Tools Discussed/Used     Consult Status Consult Status: PRN    Inocente Salles 12/04/2014, 6:58 PM

## 2014-12-04 NOTE — Anesthesia Procedure Notes (Signed)
Spinal Patient location during procedure: OR Start time: 12/04/2014 9:06 AM End time: 12/04/2014 9:09 AM Staffing Anesthesiologist: Catalina Gravel Performed by: anesthesiologist  Preanesthetic Checklist Completed: patient identified, surgical consent, pre-op evaluation, timeout performed, IV checked, risks and benefits discussed and monitors and equipment checked Spinal Block Patient position: sitting Prep: site prepped and draped and DuraPrep Patient monitoring: continuous pulse ox and blood pressure Approach: midline Location: L3-4 Needle Needle type: Pencan  Additional Notes Functioning IV was confirmed and monitors were applied. Sterile prep and drape, including hand hygiene, mask and sterile gloves were used. The patient was positioned and the spine was prepped. The skin was anesthetized with lidocaine.  Free flow of clear CSF was obtained prior to injecting local anesthetic into the CSF.  The spinal needle aspirated freely following injection.  The needle was carefully withdrawn.  The patient tolerated the procedure well. Consent was obtained prior to procedure with all questions answered and concerns addressed. Risks including but not limited to bleeding, infection, nerve damage, paralysis, failed block, inadequate analgesia, allergic reaction, high spinal, itching and headache were discussed and the patient wished to proceed.   Hoy Morn, MD

## 2014-12-04 NOTE — Transfer of Care (Signed)
Immediate Anesthesia Transfer of Care Note  Patient: Stephanie Hopkins  Procedure(s) Performed: Procedure(s) with comments: CESAREAN SECTION (N/A) - Requested 12/03/14 @4 :00p  Patient Location: PACU  Anesthesia Type:Spinal  Level of Consciousness: awake, alert  and oriented  Airway & Oxygen Therapy: Patient Spontanous Breathing  Post-op Assessment: Report given to RN and Post -op Vital signs reviewed and stable  Post vital signs: Reviewed and stable  Last Vitals:  Filed Vitals:   12/04/14 0741  BP: 107/78  Pulse: 84  Temp: 36.4 C  Resp: 18    Complications: No apparent anesthesia complications

## 2014-12-05 ENCOUNTER — Encounter (HOSPITAL_COMMUNITY): Payer: Self-pay | Admitting: Obstetrics & Gynecology

## 2014-12-05 LAB — CBC WITH DIFFERENTIAL/PLATELET
BASOS PCT: 1 %
Basophils Absolute: 0.1 10*3/uL (ref 0.0–0.1)
Eosinophils Absolute: 0.2 10*3/uL (ref 0.0–0.7)
Eosinophils Relative: 2 %
HEMATOCRIT: 31.5 % — AB (ref 36.0–46.0)
HEMOGLOBIN: 10.3 g/dL — AB (ref 12.0–15.0)
Lymphocytes Relative: 30 %
Lymphs Abs: 2.7 10*3/uL (ref 0.7–4.0)
MCH: 28.7 pg (ref 26.0–34.0)
MCHC: 32.7 g/dL (ref 30.0–36.0)
MCV: 87.7 fL (ref 78.0–100.0)
MONOS PCT: 10 %
Monocytes Absolute: 0.8 10*3/uL (ref 0.1–1.0)
NEUTROS ABS: 5.1 10*3/uL (ref 1.7–7.7)
NEUTROS PCT: 57 %
Platelets: 199 10*3/uL (ref 150–400)
RBC: 3.59 MIL/uL — AB (ref 3.87–5.11)
RDW: 14.1 % (ref 11.5–15.5)
WBC: 8.9 10*3/uL (ref 4.0–10.5)

## 2014-12-05 LAB — CBC
HEMATOCRIT: 31.3 % — AB (ref 36.0–46.0)
Hemoglobin: 10.3 g/dL — ABNORMAL LOW (ref 12.0–15.0)
MCH: 28.8 pg (ref 26.0–34.0)
MCHC: 32.9 g/dL (ref 30.0–36.0)
MCV: 87.4 fL (ref 78.0–100.0)
PLATELETS: 200 10*3/uL (ref 150–400)
RBC: 3.58 MIL/uL — AB (ref 3.87–5.11)
RDW: 13.9 % (ref 11.5–15.5)
WBC: 8.6 10*3/uL (ref 4.0–10.5)

## 2014-12-05 LAB — GLUCOSE, CAPILLARY: Glucose-Capillary: 91 mg/dL (ref 65–99)

## 2014-12-05 MED ORDER — TRAMADOL HCL 50 MG PO TABS
50.0000 mg | ORAL_TABLET | Freq: Once | ORAL | Status: AC
  Administered 2014-12-05: 50 mg via ORAL
  Filled 2014-12-05: qty 1

## 2014-12-05 NOTE — Progress Notes (Signed)
Post Op Day 1 Subjective:  Stephanie Hopkins is a 29 y.o. [id number] [redacted]w[redacted]d s/p rLTCS. Has a1 gDM.  Large diuresis over night of 4.8L. Patient feels dizzy this AM. BP laying down 93/49 and sitting was 83/45.  Pt denies problems with ambulating, voiding or po intake.  She denies nausea or vomiting.  Pain is moderately controlled.  She has had flatus. She has not had bowel movement.  Lochia Small.  Plan for birth control is IUD.  Method of Feeding: breast  Objective: Blood pressure 93/49, pulse 55, temperature 97.6 F (36.4 C), temperature source Oral, resp. rate 18, last menstrual period 03/05/2014, SpO2 98 %, unknown if currently breastfeeding.  Physical Exam:  General: alert, cooperative and no distress Lochia:normal flow Chest: CTAB Heart: RRR no m/r/g Abdomen: +BS, soft, nontender, incision with some weeping since surgery but none overnight Uterine Fundus: firm, just below umbilicus DVT Evaluation: No evidence of DVT seen on physical exam. Extremities: no edema   Recent Labs  12/03/14 1145 12/05/14 0639  HGB 13.1 10.3*  HCT 39.2 31.3*    Assessment/Plan:  ASSESSMENT: Stephanie Hopkins is a 29 y.o. M4Q6834 [redacted]w[redacted]d s/p rLTCS  POD#1 after rLTCS -H/H decreased from 13.1/39.2 to 10.3/31.3 -Continue to monitor for signs of bleeding -Repeat CBC this evening  Hypotension -Monitor q2h x 2 and if stable or improved q4h after -Monitor I&Os strictly today  A1DM -POC glucose 91  LOS: 1 day   Francene Boyers 12/05/2014, 7:34 AM

## 2014-12-05 NOTE — Anesthesia Postprocedure Evaluation (Signed)
  Anesthesia Post-op Note  Patient: Stephanie Hopkins  Procedure(s) Performed: Procedure(s) with comments: CESAREAN SECTION (N/A) - Requested 12/03/14 @4 :00p  Patient Location: Mother/Baby  Anesthesia Type:Spinal  Level of Consciousness: awake, alert  and oriented  Airway and Oxygen Therapy: Patient Spontanous Breathing  Post-op Pain: none  Post-op Assessment: Post-op Vital signs reviewed, Patient's Cardiovascular Status Stable, Respiratory Function Stable, Patent Airway, No signs of Nausea or vomiting, Adequate PO intake, Pain level controlled, No headache and No backache      Post-op Vital Signs: Reviewed and stable  Last Vitals:  Filed Vitals:   12/05/14 0654  BP: 93/49  Pulse: 55  Temp:   Resp: 18    Complications: No apparent anesthesia complications

## 2014-12-05 NOTE — Addendum Note (Signed)
Addendum  created 12/05/14 1019 by Jonna Munro, CRNA   Modules edited: Notes Section   Notes Section:  File: [id number]

## 2014-12-05 NOTE — Progress Notes (Signed)
UR chart review completed.  

## 2014-12-06 MED ORDER — OXYCODONE-ACETAMINOPHEN 5-325 MG PO TABS: 1.0000 | ORAL_TABLET | ORAL | Status: DC | PRN

## 2014-12-06 MED ORDER — IBUPROFEN 600 MG PO TABS
600.0000 mg | ORAL_TABLET | Freq: Four times a day (QID) | ORAL | Status: DC
Start: 2014-12-06 — End: 2015-07-15

## 2014-12-06 NOTE — Lactation Note (Signed)
This note was copied from the chart of Stephanie Hopkins. Lactation Consultation Note  Patient Name: Stephanie Hopkins [id number] Date: 12/06/2014 Reason for consult: Follow-up assessment;Infant weight loss;Other (Comment) (7% weight loss )  Baby is 38 hours old and has been consistent @ the breast with BF range of 15 -30 mins,  Voids and stools adequate for age, Latch scores 7-10-9 , Bili check at 38 hours 6.1 ( Low risk ) .  LC discussed cluster feeding especially with 7% weight loss, LC recommended always offer 2nd breast. Sore nipple and engorgement prevention and tx reviewed. Per mom breast feeding is going well thus far , And also mentioned 1st her 1st baby she had some problems with engorgement early on.  Per mom has a DEBP at home.  Mother informed of post-discharge support and given phone number to the lactation department, including services  for phone call assistance; out-patient appointments; and breastfeeding support group. List of other breastfeeding resources  in the community given in the handout. Encouraged mother to call for problems or concerns related to breastfeeding.   Maternal Data Has patient been taught Hand Expression?:  (per mom familiar with technique )  Feeding    LATCH Score/Interventions                Intervention(s): Breastfeeding basics reviewed     Lactation Tools Discussed/Used     Consult Status Consult Status: Complete    Myer Haff 12/06/2014, 10:38 AM

## 2014-12-06 NOTE — Discharge Summary (Signed)
OB Discharge Summary     Patient Name: Stephanie Hopkins DOB: 1985-04-15 MRN: [id number]  Date of admission: 12/04/2014 Delivering MD: Lavonia Drafts   Date of discharge: 12/06/2014  Admitting diagnosis: cpt 52841 - REPEAT c-section Intrauterine pregnancy: [redacted]w[redacted]d     Secondary diagnosis: Gestational Diabetes diet controlled (A1)     Discharge diagnosis: Term Pregnancy Delivered                                                                                               Complications: None  Hospital course:  Sceduled C/S   29 y.o. yo L2G4010 at [redacted]w[redacted]d was admitted to the hospital 12/04/2014 for scheduled cesarean section with the following indication:Elective Repeat.  Membrane Rupture Time/Date: 9:37 AM ,12/04/2014   Patient delivered a Viable infant.12/04/2014  Details of operation can be found in separate operative note.  Pateint had an uncomplicated postpartum course.  She is ambulating, tolerating a regular diet, passing flatus, and urinating well. Patient is discharged home in stable condition on No discharge date for patient encounter. She did experience two episodes of hypotension with systolics in the high 27O/ZDG 90s yesterday accompanied by dizziness. This has since resolved, and BPs have been normotensive for 24 hrs.          Physical exam  Filed Vitals:   12/05/14 1727 12/05/14 2220 12/06/14 0302 12/06/14 0609  BP: 116/50 114/69 130/66 130/78  Pulse: 58 69 60 80  Temp: 97.5 F (36.4 C) 98.3 F (36.8 C) 98.3 F (36.8 C) 98.1 F (36.7 C)  TempSrc: Oral Oral Oral Oral  Resp: 20 20 18 18   SpO2:  100%     General: alert, cooperative and no distress Lochia: appropriate Uterine Fundus: firm Incision: Healing well with no significant drainage, No significant erythema, Dressing is clean, dry, and intact DVT Evaluation: No evidence of DVT seen on physical exam. Trace Ankle edema is present Labs: Lab Results  Component Value Date   WBC 8.9 12/05/2014   HGB 10.3*  12/05/2014   HCT 31.5* 12/05/2014   MCV 87.7 12/05/2014   PLT 199 12/05/2014   CMP Latest Ref Rng 12/03/2014  Glucose 65 - 99 mg/dL 97  BUN 6 - 20 mg/dL 9  Creatinine 0.44 - 1.00 mg/dL 0.49  Sodium 135 - 145 mmol/L 134(L)  Potassium 3.5 - 5.1 mmol/L 4.1  Chloride 101 - 111 mmol/L 104  CO2 22 - 32 mmol/L 22  Calcium 8.9 - 10.3 mg/dL 8.7(L)  Total Protein 6.1 - 8.1 g/dL -  Total Bilirubin 0.2 - 1.2 mg/dL -  Alkaline Phos 33 - 115 U/L -  AST 10 - 30 U/L -  ALT 6 - 29 U/L -    Discharge instruction: per After Visit Summary and "Baby and Me Booklet".  Medications:  Current facility-administered medications:  .  acetaminophen (TYLENOL) tablet 650 mg, 650 mg, Oral, Q4H PRN, Gwynne Edinger, MD .  witch hazel-glycerin (TUCKS) pad 1 application, 1 application, Topical, PRN **AND** dibucaine (NUPERCAINAL) 1 % rectal ointment 1 application, 1 application, Rectal, PRN, Gwynne Edinger, MD .  diphenhydrAMINE (BENADRYL) injection 12.5 mg,  12.5 mg, Intravenous, Q4H PRN **OR** diphenhydrAMINE (BENADRYL) capsule 25 mg, 25 mg, Oral, Q4H PRN, Catalina Gravel, MD .  diphenhydrAMINE (BENADRYL) capsule 25 mg, 25 mg, Oral, Q6H PRN, Gwynne Edinger, MD .  ibuprofen (ADVIL,MOTRIN) tablet 600 mg, 600 mg, Oral, 4 times per day, Gwynne Edinger, MD, 600 mg at 12/06/14 9371 .  lactated ringers infusion, , Intravenous, Continuous, Gwynne Edinger, MD, Last Rate: 125 mL/hr at 12/05/14 0221 .  lanolin ointment 1 application, 1 application, Topical, PRN, Gwynne Edinger, MD .  menthol-cetylpyridinium (CEPACOL) lozenge 3 mg, 1 lozenge, Oral, Q2H PRN, Gwynne Edinger, MD .  nalbuphine (NUBAIN) injection 5 mg, 5 mg, Intravenous, Q4H PRN **OR** nalbuphine (NUBAIN) injection 5 mg, 5 mg, Subcutaneous, Q4H PRN, Catalina Gravel, MD .  nalbuphine (NUBAIN) injection 5 mg, 5 mg, Intravenous, Once PRN **OR** nalbuphine (NUBAIN) injection 5 mg, 5 mg, Subcutaneous, Once PRN, Catalina Gravel, MD .   naloxone [facility]) 2 mg in dextrose 5 % 250 mL infusion, 1-4 mcg/kg/hr, Intravenous, Continuous PRN, Catalina Gravel, MD .  naloxone [facility]) injection 0.4 mg, 0.4 mg, Intravenous, PRN **AND** sodium chloride 0.9 % injection 3 mL, 3 mL, Intravenous, PRN, Catalina Gravel, MD .  ondansetron [facility]) injection 4 mg, 4 mg, Intravenous, Q8H PRN, Catalina Gravel, MD .  oxyCODONE-acetaminophen (PERCOCET/ROXICET) 5-325 MG per tablet 1 tablet, 1 tablet, Oral, Q4H PRN, Gwynne Edinger, MD, 1 tablet at 12/04/14 2159 .  oxyCODONE-acetaminophen (PERCOCET/ROXICET) 5-325 MG per tablet 2 tablet, 2 tablet, Oral, Q4H PRN, Gwynne Edinger, MD, 2 tablet at 12/06/14 0813 .  prenatal multivitamin tablet 1 tablet, 1 tablet, Oral, Q1200, Gwynne Edinger, MD, 1 tablet at 12/05/14 1248 .  senna-docusate (Senokot-S) tablet 2 tablet, 2 tablet, Oral, Q24H, Gwynne Edinger, MD, 2 tablet at 12/06/14 0000 .  simethicone (MYLICON) chewable tablet 80 mg, 80 mg, Oral, TID PC, Gwynne Edinger, MD, 80 mg at 12/06/14 0813 .  simethicone (MYLICON) chewable tablet 80 mg, 80 mg, Oral, Q24H, Gwynne Edinger, MD, 80 mg at 12/05/14 2359 .  simethicone (MYLICON) chewable tablet 80 mg, 80 mg, Oral, PRN, Gwynne Edinger, MD .  Tdap [facility]) injection 0.5 mL, 0.5 mL, Intramuscular, Once, [facility], MD, 0.5 mL at 12/06/14 0829 .  zolpidem (AMBIEN) tablet 5 mg, 5 mg, Oral, QHS PRN, Gwynne Edinger, MD  Diet: routine diet  Activity: Advance as tolerated. Pelvic rest for 6 weeks.   Outpatient follow up:6 weeks  Postpartum contraception: IUD Mirena  Newborn Data: Live born female  Birth Weight: 7 lb 8.5 oz (3415 g) APGAR: 8, 9  Baby Feeding: Breast Disposition:home with mother   12/06/2014 Stephanie Hector, MD   CNM attestation I have seen and examined this patient and agree with above documentation in the resident's note.   Stephanie Hopkins is a 29 y.o. [id number] s/p rLTCS.   Pain is well  controlled.  Plan for birth control is IUD.  Method of Feeding: breast  PE:  BP 130/78 mmHg  Pulse 80  Temp(Src) 98.1 F (36.7 C) (Oral)  Resp 18  SpO2 100%  LMP 03/05/2014  Breastfeeding? Unknown Fundus firm  No results for input(s): HGB, HCT in the last 72 hours.   Plan: discharge today - postpartum care discussed - f/u clinic in 6 weeks for postpartum visit   Serita Grammes, CNM 8:46 AM 12/13/2014

## 2014-12-06 NOTE — Discharge Instructions (Signed)

## 2014-12-10 NOTE — Addendum Note (Signed)
Addendum  created 12/10/14 1611 by Catalina Gravel, MD   Modules edited: Anesthesia Responsible Staff

## 2014-12-13 ENCOUNTER — Ambulatory Visit (INDEPENDENT_AMBULATORY_CARE_PROVIDER_SITE_OTHER): Payer: Medicaid Other | Admitting: Obstetrics & Gynecology

## 2014-12-13 ENCOUNTER — Encounter: Payer: Self-pay | Admitting: Obstetrics & Gynecology

## 2014-12-13 VITALS — BP 146/101 | HR 98 | Resp 16 | Wt 200.0 lb

## 2014-12-13 DIAGNOSIS — G8918 Other acute postprocedural pain: Secondary | ICD-10-CM

## 2014-12-13 MED ORDER — OXYCODONE-ACETAMINOPHEN 5-325 MG PO TABS: 1.0000 | ORAL_TABLET | ORAL | Status: DC | PRN

## 2014-12-13 NOTE — Progress Notes (Signed)
   Subjective:    Patient ID: Stephanie Hopkins, female    DOB: [birth year], 29 y.o.   MRN: [id number]  HPI  29 yo P2 here about 10 days after a repeat low transverse c/s with a request for more percocet. She is using both the IBU and percocet but has finished her percocet. She denies depression/anxiety/pp blues. She denies fevers or any other problems except pain, especially pain in her tailbone.  Review of Systems     Objective:   Physical Exam  WNWHobese WFNAD Abd- benign Incision- healed well      Assessment & Plan:  Post op pain- reassurance given #20 percocet with no refills given RTC 4 weeks for pp visit/prn sooner

## 2015-01-17 ENCOUNTER — Encounter (HOSPITAL_COMMUNITY): Payer: Self-pay

## 2015-01-17 ENCOUNTER — Inpatient Hospital Stay (HOSPITAL_COMMUNITY)
Admission: AD | Admit: 2015-01-17 | Discharge: 2015-01-17 | Disposition: A | Discharge status: Home / Self Care | Payer: Medicaid Other | Source: Ambulatory Visit | Attending: Obstetrics and Gynecology | Admitting: Obstetrics and Gynecology

## 2015-01-17 DIAGNOSIS — M79605 Pain in left leg: Secondary | ICD-10-CM | POA: Diagnosis not present

## 2015-01-17 DIAGNOSIS — M79602 Pain in left arm: Secondary | ICD-10-CM | POA: Diagnosis not present

## 2015-01-17 DIAGNOSIS — Z87891 Personal history of nicotine dependence: Secondary | ICD-10-CM | POA: Diagnosis not present

## 2015-01-17 DIAGNOSIS — Z98891 History of uterine scar from previous surgery: Secondary | ICD-10-CM

## 2015-01-17 DIAGNOSIS — Z9889 Other specified postprocedural states: Secondary | ICD-10-CM | POA: Insufficient documentation

## 2015-01-17 DIAGNOSIS — M25562 Pain in left knee: Secondary | ICD-10-CM | POA: Diagnosis not present

## 2015-01-17 DIAGNOSIS — O9089 Other complications of the puerperium, not elsewhere classified: Secondary | ICD-10-CM | POA: Insufficient documentation

## 2015-01-17 LAB — URINALYSIS, ROUTINE W REFLEX MICROSCOPIC
BILIRUBIN URINE: NEGATIVE
Glucose, UA: NEGATIVE mg/dL
Hgb urine dipstick: NEGATIVE
KETONES UR: NEGATIVE mg/dL
Leukocytes, UA: NEGATIVE
NITRITE: NEGATIVE
PROTEIN: NEGATIVE mg/dL
Specific Gravity, Urine: 1.01 (ref 1.005–1.030)
UROBILINOGEN UA: 0.2 mg/dL (ref 0.0–1.0)
pH: 6 (ref 5.0–8.0)

## 2015-01-17 LAB — POCT PREGNANCY, URINE: PREG TEST UR: NEGATIVE

## 2015-01-17 NOTE — Discharge Instructions (Signed)
Musculoskeletal Pain Musculoskeletal pain is muscle and boney aches and pains. These pains can occur in any part of the body. Your caregiver may treat you without knowing the cause of the pain. They may treat you if blood or urine tests, X-rays, and other tests were normal.  CAUSES There is often not a definite cause or reason for these pains. These pains may be caused by a type of germ (virus). The discomfort may also come from overuse. Overuse includes working out too hard when your body is not fit. Boney aches also come from weather changes. Bone is sensitive to atmospheric pressure changes. HOME CARE INSTRUCTIONS   Ask when your test results will be ready. Make sure you get your test results.  Only take over-the-counter or prescription medicines for pain, discomfort, or fever as directed by your caregiver. If you were given medications for your condition, do not drive, operate machinery or power tools, or sign legal documents for 24 hours. Do not drink alcohol. Do not take sleeping pills or other medications that may interfere with treatment.  Continue all activities unless the activities cause more pain. When the pain lessens, slowly resume normal activities. Gradually increase the intensity and duration of the activities or exercise.  During periods of severe pain, bed rest may be helpful. Lay or sit in any position that is comfortable.  Putting ice on the injured area.  Put ice in a bag.  Place a towel between your skin and the bag.  Leave the ice on for 15 to 20 minutes, 3 to 4 times a day.  Follow up with your caregiver for continued problems and no reason can be found for the pain. If the pain becomes worse or does not go away, it may be necessary to repeat tests or do additional testing. Your caregiver may need to look further for a possible cause. SEEK IMMEDIATE MEDICAL CARE IF:  You have pain that is getting worse and is not relieved by medications.  You develop chest pain  that is associated with shortness or breath, sweating, feeling sick to your stomach (nauseous), or throw up (vomit).  Your pain becomes localized to the abdomen.  You develop any new symptoms that seem different or that concern you. MAKE SURE YOU:   Understand these instructions.  Will watch your condition.  Will get help right away if you are not doing well or get worse.   This information is not intended to replace advice given to you by your health care provider. Make sure you discuss any questions you have with your health care provider.   Document Released: 03/02/2005 Document Revised: 05/25/2011 Document Reviewed: 11/04/2012 Elsevier Interactive Patient Education 2016 [employer]. Deep Vein Thrombosis A deep vein thrombosis (DVT) is a blood clot (thrombus) that usually occurs in a deep, larger vein of the lower leg or the pelvis, or in an upper extremity such as the arm. These are dangerous and can lead to serious and even life-threatening complications if the clot travels to the lungs. A DVT can damage the valves in your leg veins so that instead of flowing upward, the blood pools in the lower leg. This is called post-thrombotic syndrome, and it can result in pain, swelling, discoloration, and sores on the leg. CAUSES A DVT is caused by the formation of a blood clot in your leg, pelvis, or arm. Usually, several things contribute to the formation of blood clots. A clot may develop when:  Your blood flow slows down.  Your  vein becomes damaged in some way.  You have a condition that makes your blood clot more easily. RISK FACTORS A DVT is more likely to develop in:  People who are older, especially over 45 years of age.  People who are overweight (obese).  People who sit or lie still for a long time, such as during long-distance travel (over 4 hours), bed rest, hospitalization, or during recovery from certain medical conditions like a stroke.  People who do not engage in  much physical activity (sedentary lifestyle).  People who have chronic breathing disorders.  People who have a personal or family history of blood clots or blood clotting disease.  People who have peripheral vascular disease (PVD), diabetes, or some types of cancer.  People who have heart disease, especially if the person had a recent heart attack or has congestive heart failure.  People who have neurological diseases that affect the legs (leg paresis).  People who have had a traumatic injury, such as breaking a hip or leg.  People who have recently had major or lengthy surgery, especially on the hip, knee, or abdomen.  People who have had a central line placed inside a large vein.  People who take medicines that contain the hormone estrogen. These include birth control pills and hormone replacement therapy.  Pregnancy or during childbirth or the postpartum period.  Long plane flights (over 8 hours). SIGNS AND SYMPTOMS Symptoms of a DVT can include:   Swelling of your leg or arm, especially if one side is much worse.  Warmth and redness of your leg or arm, especially if one side is much worse.  Pain in your arm or leg. If the clot is in your leg, symptoms may be more noticeable or worse when you stand or walk.  A feeling of pins and needles, if the clot is in the arm. The symptoms of a DVT that has traveled to the lungs (pulmonary embolism, PE) usually start suddenly and include:  Shortness of breath while active or at rest.  Coughing or coughing up blood or blood-tinged mucus.  Chest pain that is often worse with deep breaths.  Rapid or irregular heartbeat.  Feeling light-headed or dizzy.  Fainting.  Feeling anxious.  Sweating. There may also be pain and swelling in a leg if that is where the blood clot started. These symptoms may represent a serious problem that is an emergency. Do not wait to see if the symptoms will go away. Get medical help right away. Call  your local emergency services (911 in the [place] Do not drive yourself to the hospital. DIAGNOSIS Your health care provider will take a medical history and perform a physical exam. You may also have other tests, including:  Blood tests to assess the clotting properties of your blood.  Imaging tests, such as CT, ultrasound, MRI, X-ray, and other tests to see if you have clots anywhere in your body. TREATMENT After a DVT is identified, it can be treated. The type of treatment that you receive depends on many factors, such as the cause of your DVT, your risk for bleeding or developing more clots, and other medical conditions that you have. Sometimes, a combination of treatments is necessary. Treatment options may be combined and include:  Monitoring the blood clot with ultrasound.  Taking medicines by mouth, such as newer blood thinners (anticoagulants), thrombolytics, or warfarin.  Taking anticoagulant medicine by injection or through an IV tube.  Wearing compression stockings or using different types ofdevices.  Surgery (  rare) to remove the blood clot or to place a filter in your abdomen to stop the blood clot from traveling to your lungs. Treatments for a DVT are often divided into immediate treatment and long-term treatment (up to 3 months after DVT). You can work with your health care provider to choose the treatment program that is best for you. HOME CARE INSTRUCTIONS If you are taking a newer oral anticoagulant:  Take the medicine every single day at the same time each day.  Understand what foods and drugs interact with this medicine.  Understand that there are no regular blood tests required when using this medicine.  Understand the side effects of this medicine, including excessive bruising or bleeding. Ask your health care provider or pharmacist about other possible side effects. If you are taking warfarin:  Understand how to take warfarin and know which foods can affect how  warfarin works in Veterinary surgeon.  Understand that it is dangerous to take too much or too little warfarin. Too much warfarin increases the risk of bleeding. Too little warfarin continues to allow the risk for blood clots.  Follow your PT and INR blood testing schedule. The PT and INR results allow your health care provider to adjust your dose of warfarin. It is very important that you have your PT and INR tested as often as told by your health care provider.  Avoid major changes in your diet, or tell your health care provider before you change your diet. Arrange a visit with a registered dietitian to answer your questions. Many foods, especially foods that are high in vitamin K, can interfere with warfarin and affect the PT and INR results. Eat a consistent amount of foods that are high in vitamin K, such as:  Spinach, kale, broccoli, cabbage, collard greens, turnip greens, Brussels sprouts, peas, cauliflower, seaweed, and parsley.  Beef liver and pork liver.  Green tea.  Soybean oil.  Tell your health care provider about any and all medicines, vitamins, and supplements that you take, including aspirin and other over-the-counter anti-inflammatory medicines. Be especially cautious with aspirin and anti-inflammatory medicines. Do not take those before you ask your health care provider if it is safe to do so. This is important because many medicines can interfere with warfarin and affect the PT and INR results.  Do not start or stop taking any over-the-counter or prescription medicine unless your health care provider or pharmacist tells you to do so. If you take warfarin, you will also need to do these things:  Hold pressure over cuts for longer than usual.  Tell your dentist and other health care providers that you are taking warfarin before you have any procedures in which bleeding may occur.  Avoid alcohol or drink very small amounts. Tell your health care provider if you change your alcohol  intake.  Do not use tobacco products, including cigarettes, chewing tobacco, and e-cigarettes. If you need help quitting, ask your health care provider.  Avoid contact sports. General Instructions  Take over-the-counter and prescription medicines only as told by your health care provider. Anticoagulant medicines can have side effects, including easy bruising and difficulty stopping bleeding. If you are prescribed an anticoagulant, you will also need to do these things:  Hold pressure over cuts for longer than usual.  Tell your dentist and other health care providers that you are taking anticoagulants before you have any procedures in which bleeding may occur.  Avoid contact sports.  Wear a medical alert bracelet or carry a  medical alert card that says you have had a PE.  Ask your health care provider how soon you can go back to your normal activities. Stay active to prevent new blood clots from forming.  Make sure to exercise while traveling or when you have been sitting or standing for a long period of time. It is very important to exercise. Exercise your legs by walking or by tightening and relaxing your leg muscles often. Take frequent walks.  Wear compression stockings as told by your health care provider to help prevent more blood clots from forming.  Do not use tobacco products, including cigarettes, chewing tobacco, and e-cigarettes. If you need help quitting, ask your health care provider.  Keep all follow-up appointments with your health care provider. This is important. PREVENTION Take these actions to decrease your risk of developing another DVT:  Exercise regularly. For at least 30 minutes every day, engage in:  Activity that involves moving your arms and legs.  Activity that encourages good blood flow through your body by increasing your heart rate.  Exercise your arms and legs every hour during long-distance travel (over 4 hours). Drink plenty of water and avoid  drinking alcohol while traveling.  Avoid sitting or lying in bed for long periods of time without moving your legs.  Maintain a weight that is appropriate for your height. Ask your health care provider what weight is healthy for you.  If you are a woman who is over 71 years of age, avoid unnecessary use of medicines that contain estrogen. These include birth control pills.  Do not smoke, especially if you take estrogen medicines. If you need help quitting, ask your health care provider. If you are hospitalized, prevention measures may include:  Early walking after surgery, as soon as your health care provider says that it is safe.  Receiving anticoagulants to prevent blood clots.If you cannot take anticoagulants, other options may be available, such as wearing compression stockings or using different types of devices. SEEK IMMEDIATE MEDICAL CARE IF:  You have new or increased pain, swelling, or redness in an arm or leg.  You have numbness or tingling in an arm or leg.  You have shortness of breath while active or at rest.  You have chest pain.  You have a rapid or irregular heartbeat.  You feel light-headed or dizzy.  You cough up blood.  You notice blood in your vomit, bowel movement, or urine. These symptoms may represent a serious problem that is an emergency. Do not wait to see if the symptoms will go away. Get medical help right away. Call your local emergency services (911 in the [place] Do not drive yourself to the hospital.   This information is not intended to replace advice given to you by your health care provider. Make sure you discuss any questions you have with your health care provider.   Document Released: 03/02/2005 Document Revised: 11/21/2014 Document Reviewed: 06/27/2014 Elsevier Interactive Patient Education [employer].

## 2015-01-17 NOTE — MAU Provider Note (Signed)
Chief Complaint: Leg Pain   None     SUBJECTIVE HPI: Stephanie Hopkins is a 29 y.o. [id number] at 6 weeks postpartum following RLTCS who presents to maternity admissions reporting pain behind her left knee radiating down her left calf x 4 days. The pain is constant but worsens when standing or walking. She denies edema or warmth in either leg.  She is breastfeeding and is concerned about taking any additional medications. She denies shortness of breath, h/a, abdominal pain, vaginal bleeding, vaginal itching/burning, urinary symptoms, dizziness, n/v, or fever/chills.     Leg Pain  The incident occurred 3 to 5 days ago. There was no injury mechanism. The pain is present in the left leg. The quality of the pain is described as shooting and stabbing. The pain is moderate. The pain has been constant since onset. Pertinent negatives include no inability to bear weight, loss of motion, loss of sensation, muscle weakness, numbness or tingling. The symptoms are aggravated by weight bearing and movement. She has tried acetaminophen and NSAIDs for the symptoms. The treatment provided no relief.    Past Medical History  Diagnosis Date  . Supervision of normal pregnancy 09/14/2011    [facility] (transfer from [place]) Genetic Screen  Reports negative  Anatomic [place]  Normal @ 20 wks; poor visual of spine>rescan nml  Glucose Screen 150 >> needs 3 hr GTT  GBS   Feeding Preference  Breast  Contraception   Circumcision       . Colitis   . Gestational diabetes   . GERD (gastroesophageal reflux disease)     Zantac     Past Surgical History  Procedure Laterality Date  . 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 Cesarean section  01/30/2012    Procedure: CESAREAN SECTION;  Surgeon: Jonnie Kind, MD;  Location: [facility];  Service: Gynecology;  Laterality: N/A;  Primary Cesarean Section Delivery Baby Boy @ [phone], [place] 4/9   . Cesarean section N/A 12/04/2014    Procedure: CESAREAN  SECTION;  Surgeon: Lavonia Drafts, MD;  Location: [facility];  Service: Obstetrics;  Laterality: N/A;  Requested 12/03/14 @4 :00p   Social History   Social History  . Marital Status: Married    Spouse Name: N/A  . Number of Children: N/A  . Years of Education: N/A   Occupational History  .      Homemaker   Social History Main Topics  . Smoking status: Former Research scientist (life sciences)  . Smokeless tobacco: Never Used  . Alcohol Use: No  . Drug Use: No  . Sexual Activity: Not Currently    Birth Control/ Protection: None   Other Topics Concern  . Not on file   Social History Narrative   No current facility-administered medications on file prior to encounter.   Current Outpatient Prescriptions on File Prior to Encounter  Medication Sig Dispense Refill  . acetaminophen (TYLENOL) 500 MG tablet Take 1,000 mg by mouth every 6 (six) hours as needed for mild pain or headache.     . cetirizine (ZYRTEC) 10 MG tablet Take 10 mg by mouth daily.    . diphenhydrAMINE (BENADRYL) 25 MG tablet Take 25 mg by mouth every 6 (six) hours as needed for allergies.     Marland Kitchen ibuprofen (ADVIL,MOTRIN) 600 MG tablet Take 1 tablet (600 mg total) by mouth every 6 (six) hours. 90 tablet 1  . Prenatal Vit-Fe Fumarate-FA (MULTIVITAMIN-PRENATAL) 27-0.8 MG TABS Take 1 tablet by mouth daily.    Marland Kitchen  ranitidine (ZANTAC) 150 MG tablet Take 150 mg by mouth 2 (two) times daily as needed for heartburn.    Marland Kitchen ACCU-CHEK FASTCLIX LANCETS MISC 1 Device by Percutaneous route 4 (four) times daily. (Patient not taking: Reported on 01/17/2015) 100 each 12  . glucose blood test strip Accu check advantage test strips use as instructed for testing four time daily. ICD-10:O24.419 (Patient not taking: Reported on 01/17/2015) 100 each 12  . oxyCODONE-acetaminophen (PERCOCET/ROXICET) 5-325 MG per tablet Take 1 tablet by mouth every 4 (four) hours as needed (for pain scale 4-7). (Patient not taking: Reported on 01/17/2015) 50 tablet 0  . oxyCODONE-acetaminophen  (PERCOCET/ROXICET) 5-325 MG tablet Take 1 tablet by mouth every 4 (four) hours as needed for severe pain. (Patient not taking: Reported on 01/17/2015) 20 tablet 0   Allergies  Allergen Reactions  . Sulfa Antibiotics Rash  . Adhesive [Tape] Rash  . Bactrim [Sulfamethoxazole-Trimethoprim] Rash    ROS:  Review of Systems  Constitutional: Negative for fever, chills and fatigue.  HENT: Negative for sinus pressure.   Eyes: Negative for photophobia.  Respiratory: Negative for shortness of breath.   Cardiovascular: Negative for chest pain.  Gastrointestinal: Negative for nausea, vomiting, diarrhea and constipation.  Genitourinary: Negative for dysuria, frequency, flank pain, vaginal bleeding, vaginal discharge, difficulty urinating, vaginal pain and pelvic pain.  Musculoskeletal: Negative for neck pain.  Neurological: Negative for dizziness, tingling, weakness, numbness and headaches.  Psychiatric/Behavioral: Negative.      I have reviewed patient's Past Medical Hx, Surgical Hx, Family Hx, Social Hx, medications and allergies.   Physical Exam  Patient Vitals for the past 24 hrs:  BP Temp Temp src Pulse Resp  01/17/15 2042 117/80 mmHg 98.6 F (37 C) Oral 96 18   Constitutional: Well-developed, well-nourished female in no acute distress.  Cardiovascular: normal rate Respiratory: normal effort GI: Abd soft, non-tender. Pos BS x 4 MS: Bilateral extremities cool to touch, nontender, no edema, equal in size, normal ROM, Mildly positive Homans on left Neurologic: Alert and oriented x 4.  GU: Neg CVAT.   LAB RESULTS Results for orders placed or performed during the hospital encounter of 01/17/15 (from the past 24 hour(s))  Urinalysis, Routine w reflex microscopic (not at [facility])     Status: None   Collection Time: 01/17/15  8:30 PM  Result Value Ref Range   Color, Urine YELLOW YELLOW   APPearance CLEAR CLEAR   Specific Gravity, Urine 1.010 1.005 - 1.030   pH 6.0 5.0 - 8.0   Glucose, UA  NEGATIVE NEGATIVE mg/dL   Hgb urine dipstick NEGATIVE NEGATIVE   Bilirubin Urine NEGATIVE NEGATIVE   Ketones, ur NEGATIVE NEGATIVE mg/dL   Protein, ur NEGATIVE NEGATIVE mg/dL   Urobilinogen, UA 0.2 0.0 - 1.0 mg/dL   Nitrite NEGATIVE NEGATIVE   Leukocytes, UA NEGATIVE NEGATIVE  Pregnancy, urine POC     Status: None   Collection Time: 01/17/15  9:00 PM  Result Value Ref Range   Preg Test, Ur NEGATIVE NEGATIVE    --/--/A POS (09/19 1145)  IMAGING No results found.  MAU Management/MDM: Discussed protocol with pt to give Lovenox injection and have venous doppler study tomorrow morning.  Pt does not desire Lovenox injection.  Risks of DVT/PE discussed with pt and reasons to return to Emergency Room reviewed.  Pt declined Lovenox.  Venous doppler study ordered.  Pt instructed to go to [facility] Admissions at 8 am tomorrow.  Pt stable at time of discharge.  ASSESSMENT 1. Acute leg pain,  left   2. Status post repeat low transverse cesarean section     PLAN Discharge home with DVT/PE precautions Go to [facility] tomorrow for venous doppler study Go to closest emergency room if worsening or s/sx of PE    Medication List    STOP taking these medications        ACCU-CHEK FASTCLIX LANCETS Misc     glucose blood test strip     oxyCODONE-acetaminophen 5-325 MG tablet  Commonly known as:  PERCOCET/ROXICET      TAKE these medications        acetaminophen 500 MG tablet  Commonly known as:  TYLENOL  Take 1,000 mg by mouth every 6 (six) hours as needed for mild pain or headache.     BENADRYL 25 MG tablet  Generic drug:  diphenhydrAMINE  Take 25 mg by mouth every 6 (six) hours as needed for allergies.     calcium carbonate 750 MG chewable tablet  Commonly known as:  TUMS EX  Chew 2 tablets by mouth daily as needed for heartburn.     cetirizine 10 MG tablet  Commonly known as:  ZYRTEC  Take 10 mg by mouth daily.     ibuprofen 600 MG tablet  Commonly known as:  ADVIL,MOTRIN   Take 1 tablet (600 mg total) by mouth every 6 (six) hours.     multivitamin-prenatal 27-0.8 MG Tabs tablet  Take 1 tablet by mouth daily.     ranitidine 150 MG tablet  Commonly known as:  ZANTAC  Take 150 mg by mouth 2 (two) times daily as needed for heartburn.           Follow-up Information    Follow up with [facility].   Why:  Tomorrow at 8 am in Admissions, notify them you have a venous doppler ultrasound ordered   Contact information:   [address] [place] [address] [place] Certified Nurse-Midwife 01/17/2015  9:54 PM

## 2015-01-17 NOTE — MAU Note (Signed)
Pt presents complaining of leg pain that started 3 days ago. States the back of her knee and calf is throbbing. States the pain has gotten worse over the last few days. Tried tylenol and tylenol for it. Denies swelling.

## 2015-01-18 ENCOUNTER — Ambulatory Visit (HOSPITAL_COMMUNITY)
Admission: RE | Admit: 2015-01-18 | Discharge: 2015-01-18 | Disposition: A | Discharge status: Home / Self Care | Payer: Medicaid Other | Source: Ambulatory Visit | Attending: Advanced Practice Midwife | Admitting: Advanced Practice Midwife

## 2015-01-18 DIAGNOSIS — M79605 Pain in left leg: Secondary | ICD-10-CM | POA: Insufficient documentation

## 2015-01-18 DIAGNOSIS — M79602 Pain in left arm: Secondary | ICD-10-CM | POA: Diagnosis not present

## 2015-01-18 DIAGNOSIS — Z98891 History of uterine scar from previous surgery: Secondary | ICD-10-CM | POA: Diagnosis not present

## 2015-01-18 NOTE — Progress Notes (Signed)
*  Preliminary Results* Left lower extremity venous duplex completed. Left lower extremity is negative for deep vein thrombosis. There is no evidence of left Baker's cyst.  01/18/2015 8:54 AM  Maudry Mayhew, RVT, RDCS, RDMS

## 2015-01-21 ENCOUNTER — Encounter: Payer: Self-pay | Admitting: Advanced Practice Midwife

## 2015-01-21 ENCOUNTER — Other Ambulatory Visit (HOSPITAL_COMMUNITY)
Admission: RE | Admit: 2015-01-21 | Discharge: 2015-01-21 | Disposition: A | Discharge status: Home / Self Care | Payer: Medicaid Other | Source: Ambulatory Visit | Attending: Advanced Practice Midwife | Admitting: Advanced Practice Midwife

## 2015-01-21 ENCOUNTER — Ambulatory Visit (INDEPENDENT_AMBULATORY_CARE_PROVIDER_SITE_OTHER): Payer: Medicaid Other | Admitting: Advanced Practice Midwife

## 2015-01-21 VITALS — BP 120/83 | HR 80 | Resp 16 | Ht 65.0 in | Wt 194.0 lb

## 2015-01-21 DIAGNOSIS — Z01419 Encounter for gynecological examination (general) (routine) without abnormal findings: Secondary | ICD-10-CM | POA: Insufficient documentation

## 2015-01-21 DIAGNOSIS — Z8632 Personal history of gestational diabetes: Secondary | ICD-10-CM

## 2015-01-21 DIAGNOSIS — Z124 Encounter for screening for malignant neoplasm of cervix: Secondary | ICD-10-CM

## 2015-01-21 DIAGNOSIS — Z98891 History of uterine scar from previous surgery: Secondary | ICD-10-CM

## 2015-01-21 NOTE — Progress Notes (Signed)
Patient ID: Stephanie Hopkins, female   DOB: [birth year], 29 y.o.   MRN: [id number] [facility] Partum Exam  Stephanie Hopkins is a 29 y.o. [id number] female who presents for a postpartum visit. She is 6 w and 6 days postpartum following a female via c-section. I have fully reviewed the prenatal and intrapartum course. The delivery was at 39w 1d gestational weeks.  Anesthesia: spinal. Postpartum course has been complicated by left calf pain w/ normal dopplers. Baby's course has beenunremarkable. Baby is feeding by breast. Bleeding has stopped. Bowel function is normal. Bladder function is normal. Patient is not sexually active. Contraception method is condoms. Pt considering Mirena or Husband considering vasectomy. Postpartum depression screening:neg .  Pt requests pap smear today.  Review of Systems Pertinent items noted in HPI and remainder of comprehensive ROS otherwise negative.   Objective:    BP 116/78 mmHg  Pulse 78  Resp 16  Ht 5\' 5"  (1.651 m)  Wt 211 lb (95.709 kg)  BMI 35.11 kg/m2  Breastfeeding? Yes  General:  alert, cooperative, no distress and moderately obese   Breasts:  inspection negative, no nipple discharge or bleeding, no masses or nodularity palpable  Lungs: clear to auscultation bilaterally  Heart:  regular rate and rhythm, S1, S2 normal, no murmur, click, rub or gallop  Abdomen: soft, non-tender; bowel sounds normal; no masses,  no organomegaly and incisioned well-healed   Vulva:  normal  Vagina: normal vagina, no discharge, exudate, lesion, or erythema  Cervix:  multiparous appearance, no bleeding following Pap and no cervical motion tenderness  Corpus: normal size, contour, position, consistency, mobility, non-tender  Adnexa:  no mass, fullness, tenderness  Rectal Exam: Not performed.        Assessment:    Normal postpartum exam. Pap s mear done at today's visit.  1. History of gestational diabetes  - 2 hour GTT  2. Status post cesarean delivery  - Cytology - PAP  3. Normal  postpartum exam   Plan:    1. Contraception: condoms 2. Return for Mirena PRN 3. Follow up in: 1 year or as needed.

## 2015-01-22 LAB — CYTOLOGY - PAP

## 2015-01-23 ENCOUNTER — Telehealth: Payer: Self-pay | Admitting: *Deleted

## 2015-01-23 NOTE — Telephone Encounter (Signed)
-----   Message from Gretchen Short, [place] sent at 01/23/2015 12:15 PM EST -----   ----- Message -----    From: Manya Silvas, CNM    Sent: 01/22/2015  11:30 PM      To: Asencion Islam, RN  Needs 2 hour GTT. Had GDM.

## 2015-01-23 NOTE — Telephone Encounter (Signed)
Called pt to adv need 2hr gtt - made appt for next Monday

## 2015-01-28 ENCOUNTER — Other Ambulatory Visit: Payer: Medicaid Other

## 2015-01-29 ENCOUNTER — Telehealth: Payer: Self-pay | Admitting: *Deleted

## 2015-01-29 NOTE — Telephone Encounter (Signed)
Called pt to follow up on leg pain - negative for DVT. Pt expressed leg pain is better but still there. She has appt with her PCP to follow up on leg pain.

## 2015-02-21 ENCOUNTER — Ambulatory Visit (INDEPENDENT_AMBULATORY_CARE_PROVIDER_SITE_OTHER): Payer: Self-pay | Admitting: Family Medicine

## 2015-02-21 ENCOUNTER — Encounter: Payer: Self-pay | Admitting: Family Medicine

## 2015-02-21 DIAGNOSIS — O99345 Other mental disorders complicating the puerperium: Secondary | ICD-10-CM

## 2015-02-21 DIAGNOSIS — F53 Postpartum depression: Secondary | ICD-10-CM

## 2015-02-21 MED ORDER — SERTRALINE HCL 50 MG PO TABS: 50.0000 mg | ORAL_TABLET | Freq: Every day | ORAL | Status: DC

## 2015-02-21 NOTE — Progress Notes (Signed)
    Subjective:    Patient ID: Stephanie Hopkins is a 29 y.o. female presenting with Advice Only  on 02/21/2015  HPI: Here for feeling sad. She is 10 wks pp. She has a h/o pp depression with her last son.  She has noted feeling sadness and due to postpartum and other life stressors. She is nursing.  Review of Systems  Constitutional: Negative for fever and chills.  Respiratory: Negative for shortness of breath.   Cardiovascular: Negative for chest pain.  Gastrointestinal: Negative for nausea, vomiting and abdominal pain.  Genitourinary: Negative for dysuria.  Skin: Negative for rash.  Psychiatric/Behavioral: Positive for dysphoric mood. Negative for suicidal ideas, hallucinations and sleep disturbance. The patient is nervous/anxious.       Objective:    BP 130/88 mmHg  Pulse 90  Resp 16  Ht 5\' 5"  (1.651 m)  Wt 200 lb (90.719 kg)  BMI 33.28 kg/m2  Breastfeeding? Yes Physical Exam  Constitutional: She is oriented to person, place, and time. She appears well-developed and well-nourished. No distress.  HENT:  Head: Normocephalic and atraumatic.  Eyes: No scleral icterus.  Neck: Neck supple.  Cardiovascular: Normal rate.   Pulmonary/Chest: Effort normal.  Abdominal: Soft.  Neurological: She is alert and oriented to person, place, and time.  Skin: Skin is warm and dry.  Psychiatric: Her affect is blunt. She is withdrawn. Thought content is not paranoid and not delusional. She does not express impulsivity. She exhibits a depressed mood. She expresses no homicidal and no suicidal ideation.        Assessment & Plan:   Problem List Items Addressed This Visit      Unprioritized   Mild postpartum depression    Given mildness of symptoms-have given low dose Zoloft--should look for counselor and consider Mindfulness training as a way to avoid meds if she can. Discussed this at length with patient.      Relevant Medications   sertraline (ZOLOFT) 50 MG tablet      Total  face-to-face time with patient: 15 minutes. Over 50% of encounter was spent on counseling and coordination of care. No Follow-up on file.  Benjimen Kelley S 02/21/2015 3:50 PM

## 2015-02-21 NOTE — Patient Instructions (Signed)
Mindfulness training--there are many books avialable  Postpartum Depression and Baby Blues The postpartum period begins right after the birth of a baby. During this time, there is often a great amount of joy and excitement. It is also a time of many changes in the life of the parents. Regardless of how many times a mother gives birth, each child brings new challenges and dynamics to the family. It is not unusual to have feelings of excitement along with confusing shifts in moods, emotions, and thoughts. All mothers are at risk of developing postpartum depression or the "baby blues." These mood changes can occur right after giving birth, or they may occur many months after giving birth. The baby blues or postpartum depression can be mild or severe. Additionally, postpartum depression can go away rather quickly, or it can be a long-term condition.  CAUSES Raised hormone levels and the rapid drop in those levels are thought to be a main cause of postpartum depression and the baby blues. A number of hormones change during and after pregnancy. Estrogen and progesterone usually decrease right after the delivery of your baby. The levels of thyroid hormone and various cortisol steroids also rapidly drop. Other factors that play a role in these mood changes include major life events and genetics.  RISK FACTORS If you have any of the following risks for the baby blues or postpartum depression, know what symptoms to watch out for during the postpartum period. Risk factors that may increase the likelihood of getting the baby blues or postpartum depression include:  Having a personal or family history of depression.   Having depression while being pregnant.   Having premenstrual mood issues or mood issues related to oral contraceptives.  Having a lot of life stress.   Having marital conflict.   Lacking a social support network.   Having a baby with special needs.   Having health problems, such as  diabetes.  SIGNS AND SYMPTOMS Symptoms of baby blues include:  Brief changes in mood, such as going from extreme happiness to sadness.  Decreased concentration.   Difficulty sleeping.   Crying spells, tearfulness.   Irritability.   Anxiety.  Symptoms of postpartum depression typically begin within the first month after giving birth. These symptoms include:  Difficulty sleeping or excessive sleepiness.   Marked weight loss.   Agitation.   Feelings of worthlessness.   Lack of interest in activity or food.  Postpartum psychosis is a very serious condition and can be dangerous. Fortunately, it is rare. Displaying any of the following symptoms is cause for immediate medical attention. Symptoms of postpartum psychosis include:   Hallucinations and delusions.   Bizarre or disorganized behavior.   Confusion or disorientation.  DIAGNOSIS  A diagnosis is made by an evaluation of your symptoms. There are no medical or lab tests that lead to a diagnosis, but there are various questionnaires that a health care provider may use to identify those with the baby blues, postpartum depression, or psychosis. Often, a screening tool called the [place] Postnatal Depression Scale is used to diagnose depression in the postpartum period.  TREATMENT The baby blues usually goes away on its own in 1-2 weeks. Social support is often all that is needed. You will be encouraged to get adequate sleep and rest. Occasionally, you may be given medicines to help you sleep.  Postpartum depression requires treatment because it can last several months or longer if it is not treated. Treatment may include individual or group therapy, medicine, or both  to address any social, physiological, and psychological factors that may play a role in the depression. Regular exercise, a healthy diet, rest, and social support may also be strongly recommended.  Postpartum psychosis is more serious and needs treatment  right away. Hospitalization is often needed. HOME CARE INSTRUCTIONS  Get as much rest as you can. Nap when the baby sleeps.   Exercise regularly. Some women find yoga and walking to be beneficial.   Eat a balanced and nourishing diet.   Do little things that you enjoy. Have a cup of tea, take a bubble bath, read your favorite magazine, or listen to your favorite music.  Avoid alcohol.   Ask for help with household chores, cooking, grocery shopping, or running errands as needed. Do not try to do everything.   Talk to people close to you about how you are feeling. Get support from your partner, family members, friends, or other new moms.  Try to stay positive in how you think. Think about the things you are grateful for.   Do not spend a lot of time alone.   Only take over-the-counter or prescription medicine as directed by your health care provider.  Keep all your postpartum appointments.   Let your health care provider know if you have any concerns.  SEEK MEDICAL CARE IF: You are having a reaction to or problems with your medicine. SEEK IMMEDIATE MEDICAL CARE IF:  You have suicidal feelings.   You think you may harm the baby or someone else. MAKE SURE YOU:  Understand these instructions.  Will watch your condition.  Will get help right away if you are not doing well or get worse.   This information is not intended to replace advice given to you by your health care provider. Make sure you discuss any questions you have with your health care provider.   Document Released: 12/05/2003 Document Revised: 03/07/2013 Document Reviewed: 12/12/2012 Elsevier Interactive Patient Education [employer].

## 2015-02-22 NOTE — Assessment & Plan Note (Signed)
Given mildness of symptoms-have given low dose Zoloft--should look for counselor and consider Mindfulness training as a way to avoid meds if she can. Discussed this at length with patient.

## 2015-03-17 HISTORY — PX: WISDOM TOOTH EXTRACTION: SHX21

## 2015-06-27 ENCOUNTER — Encounter: Payer: Self-pay | Admitting: Obstetrics & Gynecology

## 2015-06-27 ENCOUNTER — Ambulatory Visit (INDEPENDENT_AMBULATORY_CARE_PROVIDER_SITE_OTHER): Payer: Medicaid Other | Admitting: Obstetrics & Gynecology

## 2015-06-27 VITALS — BP 116/77 | HR 65 | Resp 16 | Ht 65.0 in

## 2015-06-27 DIAGNOSIS — Z30014 Encounter for initial prescription of intrauterine contraceptive device: Secondary | ICD-10-CM

## 2015-06-27 DIAGNOSIS — F53 Postpartum depression: Secondary | ICD-10-CM

## 2015-06-27 DIAGNOSIS — Z01812 Encounter for preprocedural laboratory examination: Secondary | ICD-10-CM | POA: Diagnosis not present

## 2015-06-27 DIAGNOSIS — O99345 Other mental disorders complicating the puerperium: Secondary | ICD-10-CM

## 2015-06-27 DIAGNOSIS — Z30430 Encounter for insertion of intrauterine contraceptive device: Secondary | ICD-10-CM

## 2015-06-27 LAB — POCT URINE PREGNANCY: PREG TEST UR: NEGATIVE

## 2015-06-27 MED ORDER — LEVONORGESTREL 20 MCG/24HR IU IUD
INTRAUTERINE_SYSTEM | Freq: Once | INTRAUTERINE | Status: AC
  Administered 2015-06-27: 15:00:00 via INTRAUTERINE

## 2015-06-27 MED ORDER — SERTRALINE HCL 50 MG PO TABS: 50.0000 mg | ORAL_TABLET | Freq: Every day | ORAL | Status: DC

## 2015-06-27 NOTE — Progress Notes (Signed)
   Subjective:    Patient ID: Stephanie Hopkins, female    DOB: Apr 01, 1985, 30 y.o.   MRN: [id number]  HPI 30 yo P2 is here today for a refill of zoloft which she says is helping very nicely. She would also like Mirena for conraception. She is currently breastfeeding and using condoms.   Review of Systems     Objective:   Physical Exam [id number] Normal affect and mood UPT negative, consent signed, Time out procedure done. Cervix prepped with betadine and grasped with a single tooth tenaculum. Mirena was easily placed and the strings were cut to 3-4 cm. Uterus sounded to 9 cm. She tolerated the procedure well.      Assessment & Plan:  Contraception- Mirena Rec back up method for 2 weeks Depression- doing well with zoloft Refills given RTC 4 weeks for a string check

## 2015-07-15 ENCOUNTER — Encounter (HOSPITAL_COMMUNITY): Payer: Self-pay | Admitting: *Deleted

## 2015-07-15 ENCOUNTER — Inpatient Hospital Stay (HOSPITAL_COMMUNITY)
Admission: AD | Admit: 2015-07-15 | Discharge: 2015-07-15 | Disposition: A | Discharge status: Home / Self Care | Payer: Medicaid Other | Source: Ambulatory Visit | Attending: Family Medicine | Admitting: Family Medicine

## 2015-07-15 DIAGNOSIS — K219 Gastro-esophageal reflux disease without esophagitis: Secondary | ICD-10-CM | POA: Insufficient documentation

## 2015-07-15 DIAGNOSIS — F411 Generalized anxiety disorder: Secondary | ICD-10-CM

## 2015-07-15 DIAGNOSIS — Z87891 Personal history of nicotine dependence: Secondary | ICD-10-CM | POA: Diagnosis not present

## 2015-07-15 DIAGNOSIS — F419 Anxiety disorder, unspecified: Secondary | ICD-10-CM | POA: Insufficient documentation

## 2015-07-15 DIAGNOSIS — R102 Pelvic and perineal pain: Secondary | ICD-10-CM | POA: Diagnosis not present

## 2015-07-15 DIAGNOSIS — Z30432 Encounter for removal of intrauterine contraceptive device: Secondary | ICD-10-CM | POA: Diagnosis not present

## 2015-07-15 DIAGNOSIS — R42 Dizziness and giddiness: Secondary | ICD-10-CM | POA: Diagnosis present

## 2015-07-15 LAB — COMPREHENSIVE METABOLIC PANEL
ALBUMIN: 4.2 g/dL (ref 3.5–5.0)
ALK PHOS: 80 U/L (ref 38–126)
ALT: 24 U/L (ref 14–54)
ANION GAP: 7 (ref 5–15)
AST: 24 U/L (ref 15–41)
BILIRUBIN TOTAL: 0.4 mg/dL (ref 0.3–1.2)
BUN: 13 mg/dL (ref 6–20)
CALCIUM: 9.2 mg/dL (ref 8.9–10.3)
CO2: 24 mmol/L (ref 22–32)
Chloride: 105 mmol/L (ref 101–111)
Creatinine, Ser: 0.6 mg/dL (ref 0.44–1.00)
GFR calc Af Amer: >60 mL/min (ref >60)
GLUCOSE: 97 mg/dL (ref 65–99)
POTASSIUM: 3.9 mmol/L (ref 3.5–5.1)
Sodium: 136 mmol/L (ref 135–145)
TOTAL PROTEIN: 8 g/dL (ref 6.5–8.1)

## 2015-07-15 LAB — CBC WITH DIFFERENTIAL/PLATELET
BASOS PCT: 1 %
Basophils Absolute: 0 10*3/uL (ref 0.0–0.1)
Eosinophils Absolute: 0.2 10*3/uL (ref 0.0–0.7)
Eosinophils Relative: 3 %
HEMATOCRIT: 37.2 % (ref 36.0–46.0)
HEMOGLOBIN: 12.6 g/dL (ref 12.0–15.0)
LYMPHS ABS: 3.2 10*3/uL (ref 0.7–4.0)
LYMPHS PCT: 47 %
MCH: 29.2 pg (ref 26.0–34.0)
MCHC: 33.9 g/dL (ref 30.0–36.0)
MCV: 86.3 fL (ref 78.0–100.0)
MONO ABS: 0.3 10*3/uL (ref 0.1–1.0)
MONOS PCT: 5 %
NEUTROS ABS: 2.9 10*3/uL (ref 1.7–7.7)
NEUTROS PCT: 44 %
Platelets: 306 10*3/uL (ref 150–400)
RBC: 4.31 MIL/uL (ref 3.87–5.11)
RDW: 12.8 % (ref 11.5–15.5)
WBC: 6.7 10*3/uL (ref 4.0–10.5)

## 2015-07-15 LAB — URINALYSIS, ROUTINE W REFLEX MICROSCOPIC
Bilirubin Urine: NEGATIVE
GLUCOSE, UA: NEGATIVE mg/dL
KETONES UR: NEGATIVE mg/dL
LEUKOCYTES UA: NEGATIVE
Nitrite: NEGATIVE
PROTEIN: NEGATIVE mg/dL
Specific Gravity, Urine: 1.01 (ref 1.005–1.030)
pH: 7.5 (ref 5.0–8.0)

## 2015-07-15 LAB — URINE MICROSCOPIC-ADD ON: RBC / HPF: NONE SEEN RBC/hpf (ref 0–5)

## 2015-07-15 MED ORDER — KETOROLAC TROMETHAMINE 60 MG/2ML IM SOLN
60.0000 mg | Freq: Once | INTRAMUSCULAR | Status: AC
  Administered 2015-07-15: 60 mg via INTRAMUSCULAR
  Filled 2015-07-15: qty 2

## 2015-07-15 MED ORDER — IBUPROFEN 600 MG PO TABS: 600.0000 mg | ORAL_TABLET | Freq: Four times a day (QID) | ORAL | Status: DC

## 2015-07-15 NOTE — MAU Note (Signed)
Pt reports she got an IUD about 2 weeks ago and since then she has been having headaches, and "heart palpitations" and a constant anxious feeling. States she was having lower abd pain prior to the IUD and that pain has continued.

## 2015-07-15 NOTE — Discharge Instructions (Signed)
Contraception Choices Birth control (contraception) is the use of any methods or devices to stop pregnancy from happening. Below are some methods to help avoid pregnancy. HORMONAL BIRTH CONTROL  A small tube put under the skin of the upper arm (implant). The tube can stay in place for 3 years. The implant must be taken out after 3 years.  Shots given every 3 months.  Pills taken every day.  Patches that are changed once a week.  A ring put into the vagina (vaginal ring). The ring is left in place for 3 weeks and removed for 1 week. Then, a new ring is put in the vagina.  Emergency birth control pills taken after unprotected sex (intercourse). BARRIER BIRTH CONTROL   A thin covering worn on the penis (female condom) during sex.  A soft, loose covering put into the vagina (female condom) before sex.  A rubber bowl that sits over the cervix (diaphragm). The bowl must be made for you. The bowl is put into the vagina before sex. The bowl is left in place for 6 to 8 hours after sex.  A small, soft cup that fits over the cervix (cervical cap). The cup must be made for you. The cup can be left in place for 48 hours after sex.  A sponge that is put into the vagina before sex.  A chemical that kills or stops sperm from getting into the cervix and uterus (spermicide). The chemical may be a cream, jelly, foam, or pill. INTRAUTERINE (IUD) BIRTH CONTROL   IUD birth control is a small, T-shaped piece of plastic. The plastic is put inside the uterus. There are 2 types of IUD:  Copper IUD. The IUD is covered in copper wire. The copper makes a fluid that kills sperm. It can stay in place for 10 years.  Hormone IUD. The hormone stops pregnancy from happening. It can stay in place for 5 years. PERMANENT METHODS  When the woman has her fallopian tubes sealed, tied, or blocked during surgery. This stops the egg from traveling to the uterus.  The doctor places a small coil or insert into each fallopian  tube. This causes scar tissue to form and blocks the fallopian tubes.  When the female has the tubes that carry sperm tied off (vasectomy). NATURAL FAMILY PLANNING BIRTH CONTROL   Natural family planning means not having sex or using barrier birth control on the days the woman could become pregnant.  Use a calendar to keep track of the length of each period and know the days she can get pregnant.  Avoid sex during ovulation.  Use a thermometer to measure body temperature. Also watch for symptoms of ovulation.  Time sex to be after the woman has ovulated. Use condoms to help protect yourself against sexually transmitted infections (STIs). Do this no matter what type of birth control you use. Talk to your doctor about which type of birth control is best for you.   This information is not intended to replace advice given to you by your health care provider. Make sure you discuss any questions you have with your health care provider.   Document Released: 12/28/2008 Document Revised: 03/07/2013 Document Reviewed: 09/21/2012 Elsevier Interactive Patient Education [employer].

## 2015-07-15 NOTE — MAU Provider Note (Signed)
History     CSN: [id number]  Arrival date and time: 07/15/15 2144   First Provider Initiated Contact with Patient 07/15/15 2304      Chief Complaint  Patient presents with  . Dizziness  . Anxiety   HPI Ms. Stephanie Hopkins is a 30 y.o. [id number] who presents to MAU today with complaint of dizziness, anxiety, pelvic pain and heart palpitations x 3 weeks. The patient had IUD placed at [facility] 3 weeks ago prior to onset of most symptoms. She states that she had been having the pelvic pain x 2 weeks prior to IUD insertion. The pain is located at the left lateral aspect of her C/S incision. She was told it may be an ovulatory cyst and is unhappy that more investigation was not performed at that time. She states that also since IUD placement she has been extremely emotional, having anxiety and heart palpitations. She states occasional spotting, but denies vaginal bleeding, UTI symptoms, fever today.    OB History    Gravida Para Term Preterm AB TAB SAB Ectopic Multiple Living   4 2 2  2  1   0 2      Obstetric Comments   Gestational Diabetes       Past Medical History  Diagnosis Date  . Supervision of normal pregnancy 09/14/2011    [facility] (transfer from [facility]) Genetic Screen  Reports negative  Anatomic [place]  Normal @ 20 wks; poor visual of spine>rescan nml  Glucose Screen 150 >> needs 3 hr GTT  GBS   Feeding Preference  Breast  Contraception   Circumcision       . Colitis   . Gestational diabetes   . GERD (gastroesophageal reflux disease)     Zantac    . History of gestational diabetes 09/25/2014    Diet Controlled; 76%ile at 38 wks   . History of postpartum depression 07/06/2014    Past Surgical History  Procedure Laterality Date  . 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 Cesarean section  01/30/2012    Procedure: CESAREAN SECTION;  Surgeon: Jonnie Kind, MD;  Location: [facility];  Service: Gynecology;  Laterality: N/A;  Primary Cesarean  Section Delivery Baby Boy @ [phone], Apgars 4/9   . Cesarean section N/A 12/04/2014    Procedure: CESAREAN SECTION;  Surgeon: Lavonia Drafts, MD;  Location: [facility];  Service: Obstetrics;  Laterality: N/A;  Requested 12/03/14 @4 :00p  . Wisdom tooth extraction  2017    Family History  Problem Relation Age of Onset  . Hypertension Mother     Social History  Substance Use Topics  . Smoking status: Former Research scientist (life sciences)  . Smokeless tobacco: Never Used  . Alcohol Use: No    Allergies:  Allergies  Allergen Reactions  . Sulfa Antibiotics Rash  . Adhesive [Tape] Rash  . Bactrim [Sulfamethoxazole-Trimethoprim] Rash    No prescriptions prior to admission    Review of Systems  Constitutional: Negative for fever and malaise/fatigue.  Gastrointestinal: Positive for abdominal pain. Negative for nausea, vomiting, diarrhea and constipation.  Genitourinary:       Neg - vaginal bleeding, discharge   Physical Exam   Blood pressure 106/80, pulse 99, temperature 98.4 F (36.9 C), temperature source Oral, resp. rate 16, SpO2 100 %, currently breastfeeding.  Physical Exam  Nursing note and vitals reviewed. Constitutional: She is oriented to person, place, and time. She appears well-developed and well-nourished. No distress.  HENT:  Head: Normocephalic and atraumatic.  Cardiovascular: Normal rate.   Respiratory: Effort normal.  GI: Soft. She exhibits no distension and no mass. There is tenderness (mild LLQ abdominal tenderness to palpation). There is no rebound and no guarding.  C-section incision is well healed. Small area of scar tissue noted proximal to the left lateral aspect of the incision. Mild tenderness  Genitourinary: Cervix exhibits no friability. No bleeding in the vagina. Vaginal discharge (small amount of mucus discharge noted) found.  Neurological: She is alert and oriented to person, place, and time.  Skin: Skin is warm and dry. No erythema.  Psychiatric: She has a normal mood  and affect.    Results for orders placed or performed during the hospital encounter of 07/15/15 (from the past 24 hour(s))  Urinalysis, Routine w reflex microscopic (not at [facility])     Status: Abnormal   Collection Time: 07/15/15 10:25 PM  Result Value Ref Range   Color, Urine YELLOW YELLOW   APPearance CLEAR CLEAR   Specific Gravity, Urine 1.010 1.005 - 1.030   pH 7.5 5.0 - 8.0   Glucose, UA NEGATIVE NEGATIVE mg/dL   Hgb urine dipstick TRACE (A) NEGATIVE   Bilirubin Urine NEGATIVE NEGATIVE   Ketones, ur NEGATIVE NEGATIVE mg/dL   Protein, ur NEGATIVE NEGATIVE mg/dL   Nitrite NEGATIVE NEGATIVE   Leukocytes, UA NEGATIVE NEGATIVE  Urine microscopic-add on     Status: Abnormal   Collection Time: 07/15/15 10:25 PM  Result Value Ref Range   Squamous Epithelial / LPF 0-5 (A) NONE SEEN   WBC, UA 0-5 0 - 5 WBC/hpf   RBC / HPF NONE SEEN 0 - 5 RBC/hpf   Bacteria, UA RARE (A) NONE SEEN  CBC with Differential/Platelet     Status: None   Collection Time: 07/15/15 11:05 PM  Result Value Ref Range   WBC 6.7 4.0 - 10.5 K/uL   RBC 4.31 3.87 - 5.11 MIL/uL   Hemoglobin 12.6 12.0 - 15.0 g/dL   HCT 37.2 36.0 - 46.0 %   MCV 86.3 78.0 - 100.0 fL   MCH 29.2 26.0 - 34.0 pg   MCHC 33.9 30.0 - 36.0 g/dL   RDW 12.8 11.5 - 15.5 %   Platelets 306 150 - 400 K/uL   Neutrophils Relative % 44 %   Neutro Abs 2.9 1.7 - 7.7 K/uL   Lymphocytes Relative 47 %   Lymphs Abs 3.2 0.7 - 4.0 K/uL   Monocytes Relative 5 %   Monocytes Absolute 0.3 0.1 - 1.0 K/uL   Eosinophils Relative 3 %   Eosinophils Absolute 0.2 0.0 - 0.7 K/uL   Basophils Relative 1 %   Basophils Absolute 0.0 0.0 - 0.1 K/uL  Comprehensive metabolic panel     Status: None   Collection Time: 07/15/15 11:05 PM  Result Value Ref Range   Sodium 136 135 - 145 mmol/L   Potassium 3.9 3.5 - 5.1 mmol/L   Chloride 105 101 - 111 mmol/L   CO2 24 22 - 32 mmol/L   Glucose, Bld 97 65 - 99 mg/dL   BUN 13 6 - 20 mg/dL   Creatinine, Ser 0.60 0.44 - 1.00 mg/dL    Calcium 9.2 8.9 - 10.3 mg/dL   Total Protein 8.0 6.5 - 8.1 g/dL   Albumin 4.2 3.5 - 5.0 g/dL   AST 24 15 - 41 U/L   ALT 24 14 - 54 U/L   Alkaline Phosphatase 80 38 - 126 U/L   Total Bilirubin 0.4 0.3 -  1.2 mg/dL   GFR calc non Af Amer >60 >60 mL/min   GFR calc Af Amer >60 >60 mL/min   Anion gap 7 5 - 15    MAU Course  Procedures GYNECOLOGY CLINIC PROCEDURE NOTE  IUD Removal  Patient was in the dorsal lithotomy position, normal external genitalia was noted.  A speculum was placed in the patient's vagina, normal discharge was noted, no lesions. The multiparous cervix was visualized, no lesions, no abnormal discharge.  The strings of the IUD were grasped and pulled using ring forceps. The IUD was removed in its entirety. Patient tolerated the procedure well.    Patient will use condom for contraception until she decides on another form of birth control    MDM UPT- negative UA, CBC, CMP and EKG today EKG - normal sinus rhythm with possible left atrial enlargement - borderline EKG Patient given Toradol for pain in MAU. Reports improvement.  Patient was anxious to take Toradol as well, but decided to try it for her pain. Declines Vicodin/Percocet due to GI upset Assessment and Plan  A: Pelvic pain Anxiety IUD removal   P: Discharge home Rx for Ibuprofen given to patient  Warning signs for worsening condition discussed Outpatient pelvic US ordered. They will call with [place] appointment date/time.  Patient advised to follow-up with [facility] to initiate new birth control method. Safe sex practices discussed.  Patient may return to MAU as needed or if her condition were to change or worsen   Luvenia Redden, PA-C  07/16/2015, 12:35 AM

## 2015-07-16 LAB — POCT PREGNANCY, URINE: PREG TEST UR: NEGATIVE

## 2015-08-08 ENCOUNTER — Ambulatory Visit: Payer: Medicaid Other | Admitting: Obstetrics & Gynecology

## 2015-08-22 ENCOUNTER — Ambulatory Visit (HOSPITAL_COMMUNITY)
Admission: RE | Admit: 2015-08-22 | Discharge: 2015-08-22 | Disposition: A | Discharge status: Home / Self Care | Payer: Medicaid Other | Source: Ambulatory Visit | Attending: Medical | Admitting: Medical

## 2015-08-22 DIAGNOSIS — R102 Pelvic and perineal pain: Secondary | ICD-10-CM

## 2015-08-27 ENCOUNTER — Encounter (HOSPITAL_BASED_OUTPATIENT_CLINIC_OR_DEPARTMENT_OTHER): Payer: Self-pay | Admitting: *Deleted

## 2015-08-27 ENCOUNTER — Emergency Department (HOSPITAL_BASED_OUTPATIENT_CLINIC_OR_DEPARTMENT_OTHER)
Admission: EM | Admit: 2015-08-27 | Discharge: 2015-08-27 | Disposition: A | Discharge status: Home / Self Care | Payer: Medicaid Other | Attending: Emergency Medicine | Admitting: Emergency Medicine

## 2015-08-27 DIAGNOSIS — G43909 Migraine, unspecified, not intractable, without status migrainosus: Secondary | ICD-10-CM

## 2015-08-27 DIAGNOSIS — Z87891 Personal history of nicotine dependence: Secondary | ICD-10-CM | POA: Insufficient documentation

## 2015-08-27 DIAGNOSIS — G43009 Migraine without aura, not intractable, without status migrainosus: Secondary | ICD-10-CM | POA: Insufficient documentation

## 2015-08-27 MED ORDER — DIPHENHYDRAMINE HCL 50 MG/ML IJ SOLN
25.0000 mg | Freq: Once | INTRAMUSCULAR | Status: AC
  Administered 2015-08-27: 25 mg via INTRAVENOUS
  Filled 2015-08-27: qty 1

## 2015-08-27 MED ORDER — PROMETHAZINE HCL 25 MG/ML IJ SOLN
12.5000 mg | Freq: Once | INTRAMUSCULAR | Status: AC
  Administered 2015-08-27: 12.5 mg via INTRAVENOUS
  Filled 2015-08-27: qty 1

## 2015-08-27 MED ORDER — DIPHENHYDRAMINE HCL 25 MG PO CAPS: 25.0000 mg | ORAL_CAPSULE | Freq: Four times a day (QID) | ORAL | Status: DC | PRN

## 2015-08-27 MED ORDER — NAPROXEN 500 MG PO TABS: 500.0000 mg | ORAL_TABLET | Freq: Two times a day (BID) | ORAL | Status: DC

## 2015-08-27 MED ORDER — SODIUM CHLORIDE 0.9 % IV BOLUS (SEPSIS)
1000.0000 mL | Freq: Once | INTRAVENOUS | Status: AC
  Administered 2015-08-27: 1000 mL via INTRAVENOUS

## 2015-08-27 MED ORDER — KETOROLAC TROMETHAMINE 30 MG/ML IJ SOLN
30.0000 mg | Freq: Once | INTRAMUSCULAR | Status: AC
  Administered 2015-08-27: 30 mg via INTRAVENOUS
  Filled 2015-08-27: qty 1

## 2015-08-27 MED ORDER — ONDANSETRON 4 MG PO TBDP: 4.0000 mg | ORAL_TABLET | Freq: Three times a day (TID) | ORAL | Status: DC | PRN

## 2015-08-27 MED ORDER — METOCLOPRAMIDE HCL 5 MG/ML IJ SOLN
10.0000 mg | Freq: Once | INTRAMUSCULAR | Status: AC
  Administered 2015-08-27: 10 mg via INTRAVENOUS
  Filled 2015-08-27: qty 2

## 2015-08-27 NOTE — ED Provider Notes (Signed)
CSN: [id number]     Arrival date & time 08/27/15  1128 History   First MD Initiated Contact with Patient 08/27/15 1136     Chief Complaint  Patient presents with  . Migraine     HPI  Patient presents for evaluation of a migraine headache.  Patient states that [place] the [place]. They were out in the heat all day. She had a mild headache cords in the day. She took a Zofran and a Percocet when she got home and laid down. Her headache improved. She slept. She waking this morning with vomiting recurrence of a bifrontal and occipital headache. It is throbbing. She is nauseated light sensitive. This is similar to her past episodes of migraine. No neck stiffness. No fevers or chills. No other unusual symptoms.  Past Medical History  Diagnosis Date  . Supervision of normal pregnancy 09/14/2011    [facility] (transfer from [place]) Genetic Screen  Reports negative  Anatomic [place]  Normal @ 20 wks; poor visual of spine>rescan nml  Glucose Screen 150 >> needs 3 hr GTT  GBS   Feeding Preference  Breast  Contraception   Circumcision       . Colitis   . Gestational diabetes   . GERD (gastroesophageal reflux disease)     Zantac    . History of gestational diabetes 09/25/2014    Diet Controlled; 76%ile at 38 wks   . History of postpartum depression 07/06/2014   Past Surgical History  Procedure Laterality Date  . 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 Cesarean section  01/30/2012    Procedure: CESAREAN SECTION;  Surgeon: Jonnie Kind, MD;  Location: [facility];  Service: Gynecology;  Laterality: N/A;  Primary Cesarean Section Delivery Baby Boy @ [phone], Apgars 4/9   . Cesarean section N/A 12/04/2014    Procedure: CESAREAN SECTION;  Surgeon: Lavonia Drafts, MD;  Location: [facility];  Service: Obstetrics;  Laterality: N/A;  Requested 12/03/14 @4 :00p  . Wisdom tooth extraction  2017   Family History  Problem Relation Age of Onset  .  Hypertension Mother    Social History  Substance Use Topics  . Smoking status: Former Research scientist (life sciences)  . Smokeless tobacco: Never Used  . Alcohol Use: No   OB History    Gravida Para Term Preterm AB TAB SAB Ectopic Multiple Living   4 2 2  2  1   0 2      Obstetric Comments   Gestational Diabetes      Review of Systems  Constitutional: Negative for fever, chills, diaphoresis, appetite change and fatigue.  HENT: Negative for mouth sores, sore throat and trouble swallowing.   Eyes: Negative for visual disturbance.  Respiratory: Negative for cough, chest tightness, shortness of breath and wheezing.   Cardiovascular: Negative for chest pain.  Gastrointestinal: Positive for nausea. Negative for vomiting, abdominal pain, diarrhea and abdominal distention.  Endocrine: Negative for polydipsia, polyphagia and polyuria.  Genitourinary: Negative for dysuria, frequency and hematuria.  Musculoskeletal: Negative for gait problem.  Skin: Negative for color change, pallor and rash.  Neurological: Positive for headaches. Negative for dizziness, syncope and light-headedness.  Hematological: Does not bruise/bleed easily.  Psychiatric/Behavioral: Negative for behavioral problems and confusion.      Allergies  Sulfa antibiotics; Adhesive; and Bactrim  Home Medications   Prior to Admission medications   Medication Sig Start Date End Date Taking? Authorizing Provider  acetaminophen (TYLENOL)  500 MG tablet Take 1,000 mg by mouth every 6 (six) hours as needed for mild pain or headache.     Historical Provider, MD  cetirizine (ZYRTEC) 10 MG tablet Take 10 mg by mouth daily.    Historical Provider, MD  diphenhydrAMINE (BENADRYL) 25 mg capsule Take 1 capsule (25 mg total) by mouth every 6 (six) hours as needed. 08/27/15   Tanna Furry, MD  ibuprofen (ADVIL,MOTRIN) 600 MG tablet Take 1 tablet (600 mg total) by mouth every 6 (six) hours. 07/15/15   Luvenia Redden, PA-C  naproxen (NAPROSYN) 500 MG tablet Take 1  tablet (500 mg total) by mouth 2 (two) times daily. 08/27/15   Tanna Furry, MD  omeprazole (PRILOSEC) 20 MG capsule Take 20 mg by mouth daily.    Historical Provider, MD  ondansetron (ZOFRAN ODT) 4 MG disintegrating tablet Take 1 tablet (4 mg total) by mouth every 8 (eight) hours as needed for nausea. 08/27/15   Tanna Furry, MD  Prenatal Vit-Fe Fumarate-FA (MULTIVITAMIN-PRENATAL) 27-0.8 MG TABS Take 1 tablet by mouth daily.    Historical Provider, MD  sertraline (ZOLOFT) 50 MG tablet Take 1 tablet (50 mg total) by mouth daily. 06/27/15   Emily Filbert, MD   BP 116/71 mmHg  Pulse 54  Temp(Src) 98 F (36.7 C) (Oral)  Resp 16  Wt 200 lb (90.719 kg)  SpO2 97%  LMP 07/15/2015  Breastfeeding? Yes Physical Exam  Constitutional: She is oriented to person, place, and time. She appears well-developed and well-nourished. No distress.  HENT:  Head: Normocephalic.  Eyes: Conjunctivae are normal. Pupils are equal, round, and reactive to light. No scleral icterus.  Neck: Normal range of motion. Neck supple. No thyromegaly present.  Cardiovascular: Normal rate and regular rhythm.  Exam reveals no gallop and no friction rub.   No murmur heard. Pulmonary/Chest: Effort normal and breath sounds normal. No respiratory distress. She has no wheezes. She has no rales.  Abdominal: Soft. Bowel sounds are normal. She exhibits no distension. There is no tenderness. There is no rebound.  Musculoskeletal: Normal range of motion.  Neurological: She is alert and oriented to person, place, and time.  Skin: Skin is warm and dry. No rash noted.  Psychiatric: She has a normal mood and affect. Her behavior is normal.    ED Course  Procedures (including critical care time) Labs Review Labs Reviewed - No data to display  Imaging Review No results found. I have personally reviewed and evaluated these images and lab results as part of my medical decision-making.   EKG Interpretation None      MDM   Final diagnoses:   Migraine without status migrainosus, not intractable, unspecified migraine type    Patient has improved with medications. States her headache is down to a 3. She saw some nausea. His given Phenergan. His I walk in the room she is sitting in a dark room staring at her well lit cell phone. States her photophobia has resolved. Plan will be some Phenergan here. Discharge home with Reglan, naproxen, Benadryl. Primary care follow-up.    Tanna Furry, MD 08/27/15 1400

## 2015-08-27 NOTE — ED Notes (Signed)
Pt here for migraine "everywhere".  It is in forehead, back of neck and everywhere.  Pt is nauseated with sensitivity to light and sound.  Pt is in tears and states that it began last night.  Hx of migraines.  No relief at home with tylenol or ibuprofen.  Pt is breastfeeding.

## 2015-08-27 NOTE — Discharge Instructions (Signed)

## 2015-10-03 DIAGNOSIS — K219 Gastro-esophageal reflux disease without esophagitis: Secondary | ICD-10-CM | POA: Insufficient documentation

## 2015-11-05 ENCOUNTER — Encounter (HOSPITAL_BASED_OUTPATIENT_CLINIC_OR_DEPARTMENT_OTHER): Payer: Self-pay

## 2015-11-05 ENCOUNTER — Emergency Department (HOSPITAL_BASED_OUTPATIENT_CLINIC_OR_DEPARTMENT_OTHER)
Admission: EM | Admit: 2015-11-05 | Discharge: 2015-11-05 | Disposition: A | Discharge status: Home / Self Care | Payer: Medicaid Other | Attending: Physician Assistant | Admitting: Physician Assistant

## 2015-11-05 DIAGNOSIS — Z87891 Personal history of nicotine dependence: Secondary | ICD-10-CM | POA: Insufficient documentation

## 2015-11-05 DIAGNOSIS — K529 Noninfective gastroenteritis and colitis, unspecified: Secondary | ICD-10-CM | POA: Diagnosis not present

## 2015-11-05 DIAGNOSIS — R112 Nausea with vomiting, unspecified: Secondary | ICD-10-CM | POA: Diagnosis present

## 2015-11-05 DIAGNOSIS — Z791 Long term (current) use of non-steroidal anti-inflammatories (NSAID): Secondary | ICD-10-CM | POA: Insufficient documentation

## 2015-11-05 DIAGNOSIS — R197 Diarrhea, unspecified: Secondary | ICD-10-CM

## 2015-11-05 DIAGNOSIS — Z79899 Other long term (current) drug therapy: Secondary | ICD-10-CM | POA: Diagnosis not present

## 2015-11-05 LAB — CBC WITH DIFFERENTIAL/PLATELET
BASOS ABS: 0 10*3/uL (ref 0.0–0.1)
BASOS PCT: 0 %
EOS ABS: 0.1 10*3/uL (ref 0.0–0.7)
EOS PCT: 1 %
HCT: 40.2 % (ref 36.0–46.0)
Hemoglobin: 13.5 g/dL (ref 12.0–15.0)
Lymphocytes Relative: 22 %
Lymphs Abs: 1.7 10*3/uL (ref 0.7–4.0)
MCH: 28.9 pg (ref 26.0–34.0)
MCHC: 33.6 g/dL (ref 30.0–36.0)
MCV: 86.1 fL (ref 78.0–100.0)
MONO ABS: 0.4 10*3/uL (ref 0.1–1.0)
Monocytes Relative: 5 %
Neutro Abs: 5.7 10*3/uL (ref 1.7–7.7)
Neutrophils Relative %: 72 %
PLATELETS: 311 10*3/uL (ref 150–400)
RBC: 4.67 MIL/uL (ref 3.87–5.11)
RDW: 12.7 % (ref 11.5–15.5)
WBC: 7.9 10*3/uL (ref 4.0–10.5)

## 2015-11-05 LAB — URINALYSIS, ROUTINE W REFLEX MICROSCOPIC
Bilirubin Urine: NEGATIVE
GLUCOSE, UA: NEGATIVE mg/dL
Hgb urine dipstick: NEGATIVE
KETONES UR: NEGATIVE mg/dL
Nitrite: NEGATIVE
PH: 8.5 — AB (ref 5.0–8.0)
Protein, ur: NEGATIVE mg/dL
SPECIFIC GRAVITY, URINE: 1.018 (ref 1.005–1.030)

## 2015-11-05 LAB — COMPREHENSIVE METABOLIC PANEL
ALT: 28 U/L (ref 14–54)
AST: 24 U/L (ref 15–41)
Albumin: 4.3 g/dL (ref 3.5–5.0)
Alkaline Phosphatase: 95 U/L (ref 38–126)
Anion gap: 10 (ref 5–15)
BUN: 9 mg/dL (ref 6–20)
CALCIUM: 9.3 mg/dL (ref 8.9–10.3)
CO2: 26 mmol/L (ref 22–32)
CREATININE: 0.63 mg/dL (ref 0.44–1.00)
Chloride: 101 mmol/L (ref 101–111)
GFR calc Af Amer: >60 mL/min (ref >60)
GFR calc non Af Amer: >60 mL/min (ref >60)
Glucose, Bld: 108 mg/dL — ABNORMAL HIGH (ref 65–99)
Potassium: 4.2 mmol/L (ref 3.5–5.1)
SODIUM: 137 mmol/L (ref 135–145)
TOTAL PROTEIN: 8.2 g/dL — AB (ref 6.5–8.1)
Total Bilirubin: 0.4 mg/dL (ref 0.3–1.2)

## 2015-11-05 LAB — URINE MICROSCOPIC-ADD ON

## 2015-11-05 LAB — PREGNANCY, URINE: Preg Test, Ur: NEGATIVE

## 2015-11-05 MED ORDER — ONDANSETRON HCL 4 MG PO TABS: 4.0000 mg | ORAL_TABLET | Freq: Three times a day (TID) | ORAL | 0 refills | Status: DC | PRN

## 2015-11-05 MED ORDER — SODIUM CHLORIDE 0.9 % IV BOLUS (SEPSIS)
1000.0000 mL | Freq: Once | INTRAVENOUS | Status: AC
  Administered 2015-11-05: 1000 mL via INTRAVENOUS

## 2015-11-05 MED ORDER — ONDANSETRON HCL 4 MG/2ML IJ SOLN
4.0000 mg | Freq: Once | INTRAMUSCULAR | Status: AC
  Administered 2015-11-05: 4 mg via INTRAVENOUS
  Filled 2015-11-05: qty 2

## 2015-11-05 MED ORDER — GI COCKTAIL ~~LOC~~
30.0000 mL | Freq: Once | ORAL | Status: AC
  Administered 2015-11-05: 30 mL via ORAL
  Filled 2015-11-05: qty 30

## 2015-11-05 NOTE — ED Notes (Signed)
MD at bedside. 

## 2015-11-05 NOTE — Discharge Instructions (Signed)
We are sorry that you having nausea and vomiting. Please take this medicine for nausea to help with the symptoms. Please return with any abdominal pain fever or other concerns.

## 2015-11-05 NOTE — ED Triage Notes (Signed)
Pt states she has had n/v for the last 4 hours with abdominal cramping.  She states she has tried [employer], dramamine, ginger ale and has not had relief.  States that she cannot hold anything down and is vomiting bile now.

## 2015-11-05 NOTE — ED Provider Notes (Addendum)
[facility] Provider Note   CSN: [id number] Arrival date & time: 11/05/15  2100 By signing my name below, I, Stephanie Hopkins, attest that this documentation has been prepared under the direction and in the presence of [name], MD. Electronically Signed: Georgette Hopkins, ED Scribe. 11/05/15. 10:01 PM.  History   Chief Complaint Chief Complaint  Patient presents with  . Emesis   HPI Comments: Stephanie Hopkins is a 30 y.o. female who presents to the Emergency Department complaining of sudden onset, constant nausea onset one day ago, worsening today around 3 pm. Pt also has associated episodic vomiting, and abdominal cramping. She states she cannot hold anything down, she notes she feels hydrated. No known sick contacts with similar symptoms. Pt has tried [employer], dramamine, and Ginger Ale with no relief to her symptoms. Pt is currently breast feeding, her period is currently unstable. Denies fever, diarrhea, headache, arthralgias, or any other associated symptoms.   The history is provided by the patient. No language interpreter was used.    Past Medical History:  Diagnosis Date  . Colitis   . GERD (gastroesophageal reflux disease)    Zantac    . Gestational diabetes   . History of gestational diabetes 09/25/2014   Diet Controlled; 76%ile at 38 wks   . History of postpartum depression 07/06/2014  . Supervision of normal pregnancy 09/14/2011   [facility] (transfer from [place]) Genetic Screen  Reports negative  Anatomic [place]  Normal @ 20 wks; poor visual of spine>rescan nml  Glucose Screen 150 >> needs 3 hr GTT  GBS   Feeding Preference  Breast  Contraception   Circumcision         Patient Active Problem List   Diagnosis Date Noted  . History of gestational diabetes 09/25/2014  . Mild postpartum depression 07/06/2014    Past Surgical History:  Procedure Laterality Date  . CESAREAN SECTION  01/30/2012   Procedure: CESAREAN SECTION;   Surgeon: Jonnie Kind, MD;  Location: [facility];  Service: Gynecology;  Laterality: N/A;  Primary Cesarean Section Delivery Baby Boy @ [phone], Apgars 4/9   . CESAREAN SECTION N/A 12/04/2014   Procedure: CESAREAN SECTION;  Surgeon: Lavonia Drafts, MD;  Location: [facility];  Service: Obstetrics;  Laterality: N/A;  Requested 12/03/14 @4 :00p  . MANDIBLE SURGERY  30 yo   Missing bone  . WISDOM TOOTH EXTRACTION  2017    OB History    Gravida Para Term Preterm AB Living   4 2 2   2 2    SAB TAB Ectopic Multiple Live Births   1     0 2      Obstetric Comments   Gestational Diabetes        Home Medications    Prior to Admission medications   Medication Sig Start Date End Date Taking? Authorizing Provider  acetaminophen (TYLENOL) 500 MG tablet Take 1,000 mg by mouth every 6 (six) hours as needed for mild pain or headache.    Yes Historical Provider, MD  cetirizine (ZYRTEC) 10 MG tablet Take 10 mg by mouth daily.   Yes Historical Provider, MD  diphenhydrAMINE (BENADRYL) 25 mg capsule Take 1 capsule (25 mg total) by mouth every 6 (six) hours as needed. 08/27/15  Yes Tanna Furry, MD  ibuprofen (ADVIL,MOTRIN) 600 MG tablet Take 1 tablet (600 mg total) by mouth every 6 (six) hours. 07/15/15  Yes Luvenia Redden, PA-C  ondansetron (ZOFRAN ODT) 4 MG disintegrating tablet  Take 1 tablet (4 mg total) by mouth every 8 (eight) hours as needed for nausea. 08/27/15  Yes Tanna Furry, MD  Prenatal Vit-Fe Fumarate-FA (MULTIVITAMIN-PRENATAL) 27-0.8 MG TABS Take 1 tablet by mouth daily.   Yes Historical Provider, MD  sertraline (ZOLOFT) 50 MG tablet Take 1 tablet (50 mg total) by mouth daily. 06/27/15  Yes Myra Marijo Sanes, MD  naproxen (NAPROSYN) 500 MG tablet Take 1 tablet (500 mg total) by mouth 2 (two) times daily. Patient not taking: Reported on 11/05/2015 08/27/15   Tanna Furry, MD  omeprazole (PRILOSEC) 20 MG capsule Take 20 mg by mouth daily.    Historical Provider, MD    Family History Family History  Problem  Relation Age of Onset  . Hypertension Mother     Social History Social History  Substance Use Topics  . Smoking status: Former Research scientist (life sciences)  . Smokeless tobacco: Never Used  . Alcohol use No     Allergies   Sulfa antibiotics; Adhesive [tape]; and Bactrim [sulfamethoxazole-trimethoprim]   Review of Systems Review of Systems  Constitutional: Negative for fever.  Gastrointestinal: Positive for abdominal pain, nausea and vomiting. Negative for diarrhea.  Musculoskeletal: Negative for arthralgias.  Neurological: Negative for headaches.  All other systems reviewed and are negative.    Physical Exam Updated Vital Signs BP 128/92 (BP Location: Left Arm)   Pulse 104   Temp 97.5 F (36.4 C) (Oral)   Resp 18   Ht 5\' 4"  (1.626 m)   Wt 196 lb (88.9 kg)   LMP 07/15/2015   SpO2 98%   Breastfeeding? Yes   BMI 33.64 kg/m   Physical Exam  Constitutional: She is oriented to person, place, and time. She appears well-developed and well-nourished.  HENT:  Head: Normocephalic.  Mouth/Throat: Oropharynx is clear and moist.  Eyes: Conjunctivae are normal.  Cardiovascular: Normal rate, regular rhythm and normal heart sounds.  Exam reveals no gallop and no friction rub.   No murmur heard. Pulmonary/Chest: Effort normal and breath sounds normal. No respiratory distress. She has no wheezes. She has no rales.  Abdominal: Soft. Bowel sounds are normal. She exhibits no distension. There is no tenderness. There is no guarding.  Musculoskeletal: Normal range of motion. She exhibits no edema.  Neurological: She is alert and oriented to person, place, and time.  Skin: Skin is warm and dry.  Psychiatric: She has a normal mood and affect. Her behavior is normal.  Nursing note and vitals reviewed.    ED Treatments / Results  DIAGNOSTIC STUDIES: Oxygen Saturation is 98% on RA, normal by my interpretation.    COORDINATION OF CARE: 10:06 PM Discussed treatment plan with pt at bedside which includes  IVF and pain and nausea Rx and pt agreed to plan.  Labs (all labs ordered are listed, but only abnormal results are displayed) Labs Reviewed  URINALYSIS, ROUTINE W REFLEX MICROSCOPIC (NOT AT [facility]) - Abnormal; Notable for the following:       Result Value   APPearance TURBID (*)    pH 8.5 (*)    Leukocytes, UA MODERATE (*)    All other components within normal limits  URINE MICROSCOPIC-ADD ON - Abnormal; Notable for the following:    Squamous Epithelial / LPF 0-5 (*)    Bacteria, UA FEW (*)    All other components within normal limits  PREGNANCY, URINE    EKG  EKG Interpretation None       Radiology No results found.  Procedures Procedures (including critical care time)  Medications Ordered in ED Medications - No data to display   Initial Impression / Assessment and Plan / ED Course  I have reviewed the triage vital signs and the nursing notes.  Pertinent labs & imaging results that were available during my care of the patient were reviewed by me and considered in my medical decision making (see chart for details).  Clinical Course   Patient well-appearing 30 year old female presenting with nausea and vomiting. Patient says she had some last night but really started 3 PM today. She says her stomach feels gurgly like she might have diarrhea starting soon as well. Patient's very well-appearing no abdominal tenderness. Likely suspect viral gastroenteritis. We'll give fluids, check liver function and renal function. We will likely be able to by mouth challenge and discharge home.    Final Clinical Impressions(s) / ED Diagnoses   Final diagnoses:  None    New Prescriptions New Prescriptions   No medications on file  I personally performed the services described in this documentation, which was scribed in my presence. The recorded information has been reviewed and is accurate.       [name], MD 11/05/15 2216    [place], MD 11/05/15  2233

## 2015-11-20 DIAGNOSIS — G43109 Migraine with aura, not intractable, without status migrainosus: Secondary | ICD-10-CM | POA: Insufficient documentation

## 2015-11-26 IMAGING — US US MFM OB FOLLOW-UP
1 series · 12 of 28 positions shown · non-contrast
Comparison: none

[Series 1: us mfm ob follow-up · 12 of 34 slices shown]
[im 2/34]
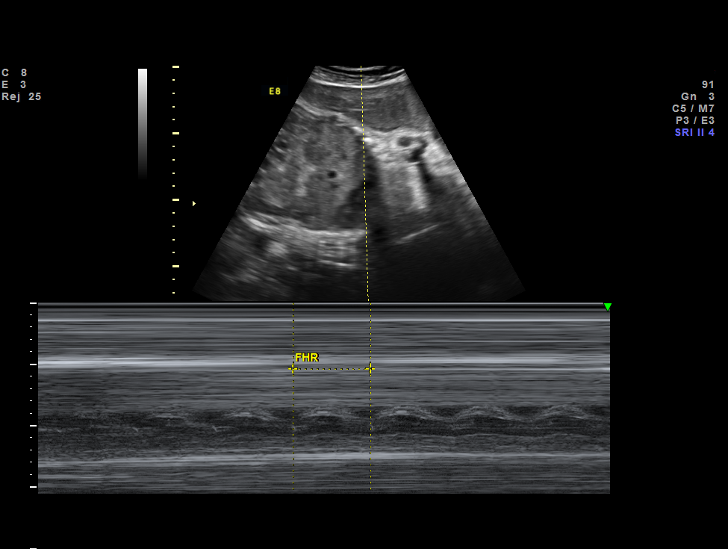
[im 4/34]
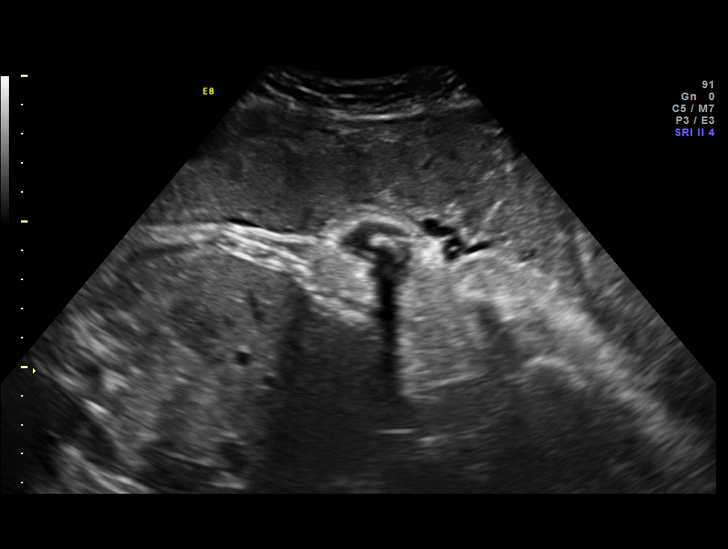
[im 7/34]
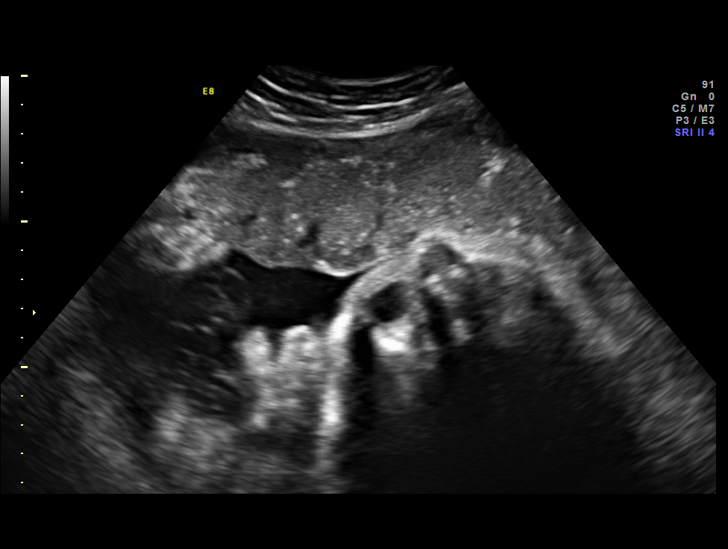
[im 10/34]
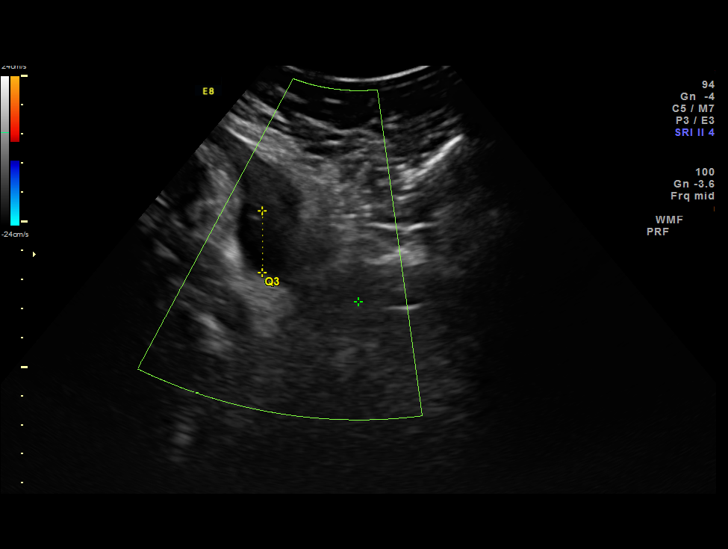
[im 13/34]
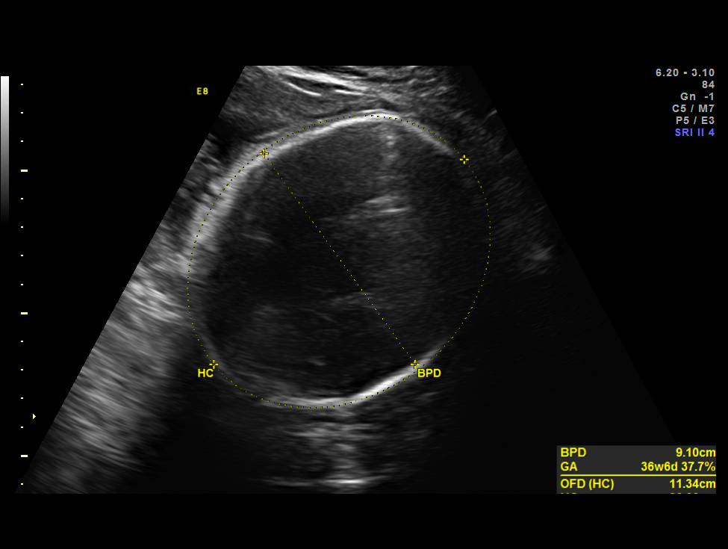
[im 15/34]
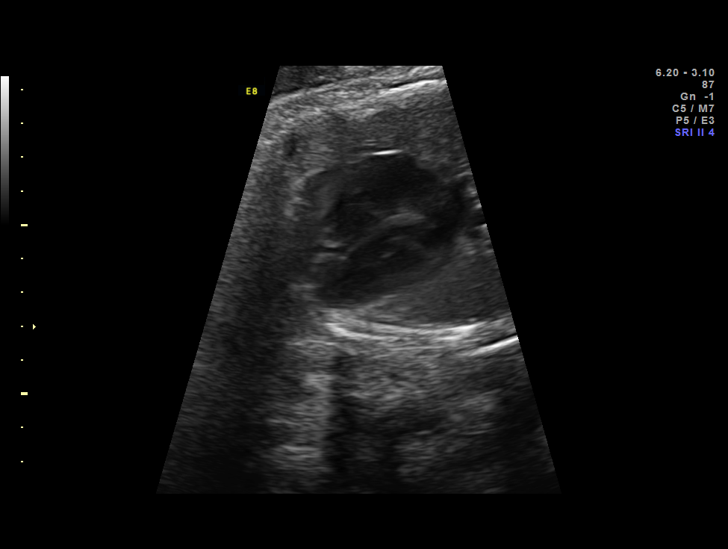
[im 19/34]
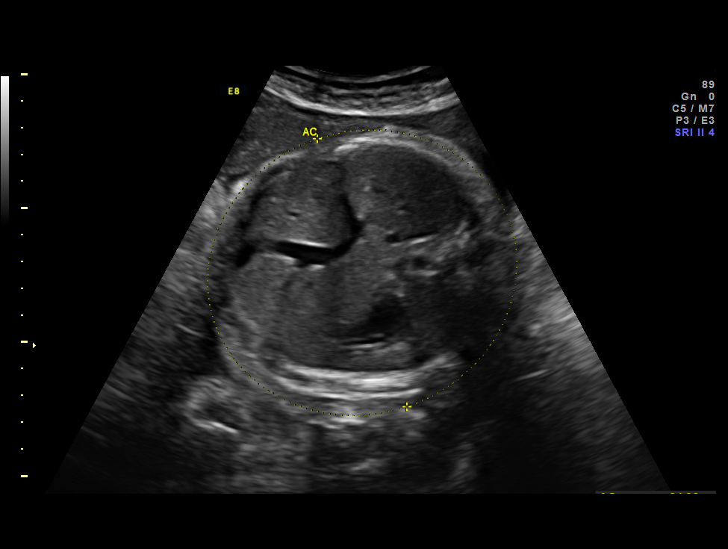
[im 21/34]
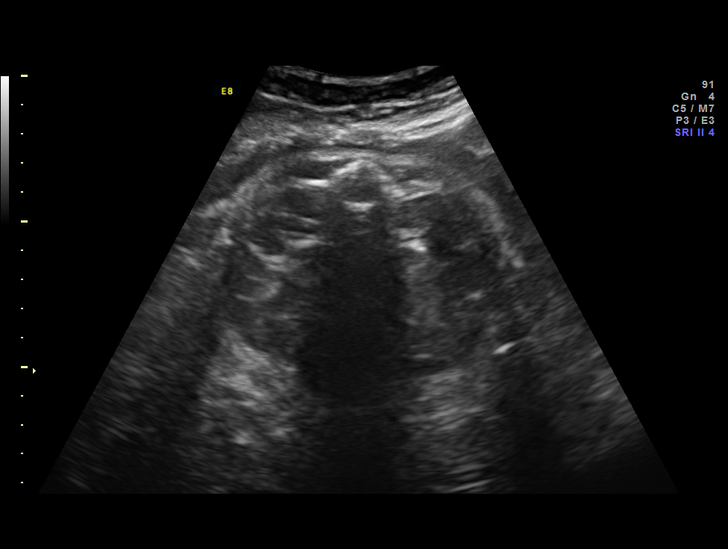
[im 24/34]
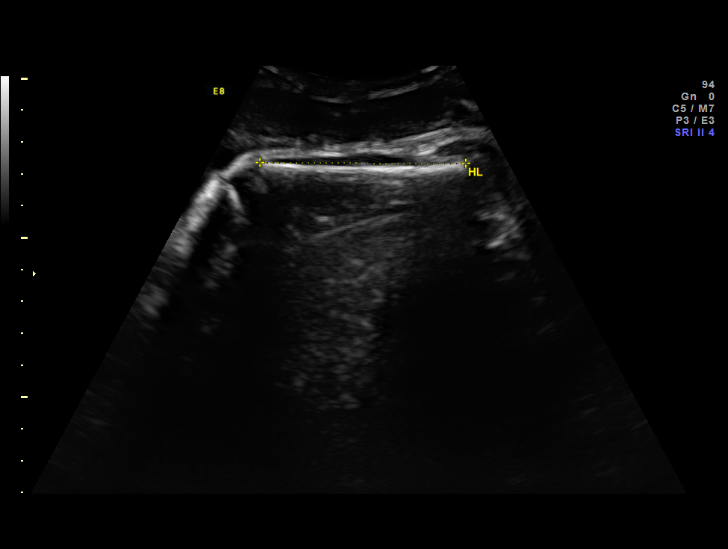
[im 27/34]
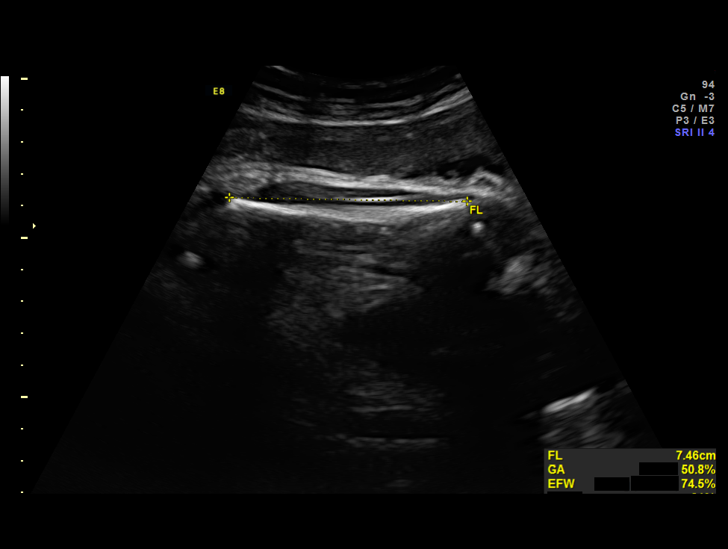
[im 30/34]
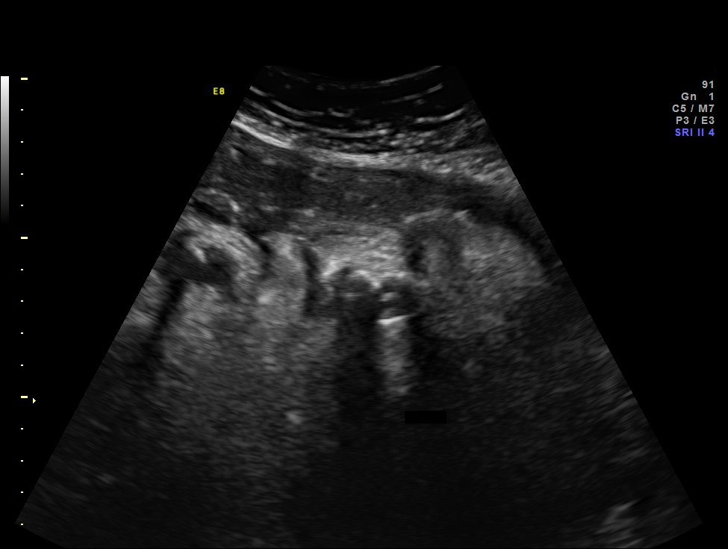
[im 32/34]
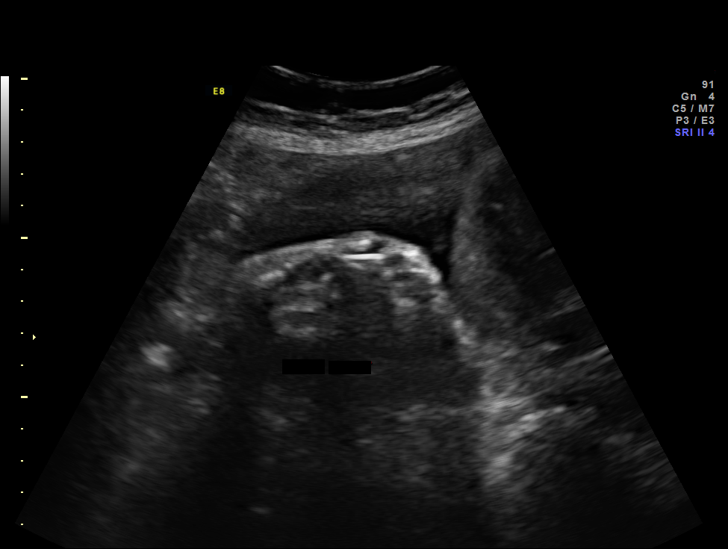

[12 of 28 positions shown; findings below may reference images not displayed]

OBSTETRICS REPORT
(Signed Final 11/28/2014 [DATE])

Name:       FREDELENE BITTERBOS                          Visit  11/28/2014 [DATE]
Date:

Service(s) Provided

Indications

Previous cesarean section
Gestational diabetes in pregnancy, diet controlled
Poor obstetric history: Previous gestational
diabetes
38 weeks gestation of pregnancy
Fetal Evaluation

Num Of             1
Fetuses:
Fetal Heart        130                          bpm
Rate:
Cardiac Activity:  Observed
Placenta:          Anterior, above cervical
os
P. Cord            Previously Visualized
Insertion:

Amniotic Fluid
AFI FV:      Subjectively within normal limits
AFI Sum:     11.76    cm      40  %Tile     Larg Pckt:    3.84   cm
RUQ:   3.66    cm    RLQ:   3.84    cm   LUQ:    2.13    cm   LLQ:    2.13   cm
Biometry

BPD:     91.2   m    G. Age:   37w 0d                 CI:         80.3   70 - 86
m
OFD:    113.6   m                                     FL/HC:      22.9   20.9 -
m
HC:     327.2   m    G. Age:   37w 1d        11  %    HC/AC:      0.95   0.92 -
m
AC:     344.5   m    G. Age:   38w 2d        70  %    FL/BPD      82.1   71 - 87
m                                     :
FL:      74.9   m    G. Age:   38w 2d        54  %    FL/AC:      21.7   20 - 24
m
HUM:     65.6   m    G. Age:   38w 0d        73  %
m

Est.        0026   gm    7 lb 7 oz      76   %
FW:
Gestational Age

LMP:           38w 2d        Date:  03/05/14                  EDD:   12/10/14
U/S Today:     37w 5d                                         EDD:   12/14/14
Best:          38w 2d    Det. By:   LMP  (03/05/14)           EDD:   12/10/14
Anatomy

Cranium:          Previously seen        Aortic Arch:       Previously seen
Fetal Cavum:      Previously seen        Ductal Arch:       Previously seen
Ventricles:       Previously seen        Diaphragm:         Appears normal
Choroid Plexus:   Previously seen        Stomach:           Appears normal,
left sided
Cerebellum:       Previously seen        Abdomen:           Appears normal
Posterior         Previously seen        Abdominal          Previously seen
Fossa:                                   Wall:
Nuchal Fold:      Previously seen        Cord Vessels:      Previously seen
Face:             Orbits and profile     Kidneys:           Appear normal
previously seen
Lips:             Previously seen        Bladder:           Appears normal
Heart:            Echogenic focus        Spine:             Previously seen
in LV prev seen
RVOT:             Previously seen        Lower              Previously seen
Extremities:
LVOT:             Previously seen        Upper              Previously seen
Extremities:

Other:   Female gender previously seen. Heels and 5th digit previously
visualized.
Cervix Uterus Adnexa

Cervix:       Not visualized (advanced GA >51wks)
Impression

Single IUP at 38w 2d
Echogenic intracardiac focus
Remainder of the anatomy appears normal, although
limited due to gestational age
The estimated fetal weight is at the 76th %tile
Anterior placenta
Normal amniotic fluid volume (AFI 11.7 cm)

---------------------------------------------------------------------- Recommendations

Patient tentatively scheduled for C-section next week
Follow-up ultrasounds as clinically indicated.

## 2016-01-13 ENCOUNTER — Encounter: Payer: Self-pay | Admitting: Family Medicine

## 2016-01-13 ENCOUNTER — Ambulatory Visit (INDEPENDENT_AMBULATORY_CARE_PROVIDER_SITE_OTHER): Payer: Medicaid Other | Admitting: Family Medicine

## 2016-01-13 VITALS — BP 126/68 | HR 74 | Ht 64.0 in | Wt 196.0 lb

## 2016-01-13 DIAGNOSIS — F418 Other specified anxiety disorders: Secondary | ICD-10-CM

## 2016-01-13 DIAGNOSIS — N6322 Unspecified lump in the left breast, upper inner quadrant: Secondary | ICD-10-CM | POA: Diagnosis not present

## 2016-01-13 MED ORDER — SERTRALINE HCL 100 MG PO TABS: 50.0000 mg | ORAL_TABLET | Freq: Every day | ORAL | 3 refills | Status: DC

## 2016-01-13 NOTE — Progress Notes (Signed)
   Subjective:    Patient ID: Stephanie Hopkins is a 30 y.o. female presenting with No chief complaint on file.  on 01/13/2016  HPI: Had some postpartum depression and placed on Zoloft 50 mg and noted this took the edge off. Has migraines which are improved on Magnesium. Notes increased anxiety and new work load due to husband on 3rd shift. Feels overwhelmed and that she is not being a good mom. She is not homicidal. She is not actively suicidal, but has some thoughts of "would the world be better off without her". Also, notes a lump in her left breast. Has h/o fibrocystic change in both breasts, but would like this one examined. Denies tenderness. She is still nursing 3x/day.  Review of Systems  Constitutional: Negative for chills and fever.  Respiratory: Negative for shortness of breath.   Cardiovascular: Negative for chest pain.  Gastrointestinal: Negative for abdominal pain, nausea and vomiting.  Genitourinary: Negative for dysuria.  Skin: Negative for rash.       Lump in left breast  Psychiatric/Behavioral: Positive for dysphoric mood, sleep disturbance and suicidal ideas (without plan). Negative for hallucinations. The patient is nervous/anxious.       Objective:    BP 126/68 (BP Location: Left Arm, Patient Position: Sitting)   Pulse 74   Ht 5\' 4"  (1.626 m)   Wt 196 lb (88.9 kg)   Breastfeeding? Yes   BMI 33.64 kg/m  Physical Exam  Constitutional: She is oriented to person, place, and time. She appears well-developed and well-nourished. No distress.  HENT:  Head: Normocephalic and atraumatic.  Eyes: No scleral icterus.  Neck: Neck supple.  Cardiovascular: Normal rate.   Pulmonary/Chest: Effort normal.    Diffuse fibrocystic change noted.  Abdominal: Soft.  Neurological: She is alert and oriented to person, place, and time.  Skin: Skin is warm and dry.  Psychiatric: Her speech is normal and behavior is normal. Judgment and thought content normal. Her mood appears anxious.  Cognition and memory are normal. She exhibits a depressed mood. She expresses no suicidal ideation. She expresses no suicidal plans.        Assessment & Plan:   Problem List Items Addressed This Visit      Unprioritized   Depression with anxiety    On very low dose Zoloft, will increase. Referral to [facility]. Consider formal referral for psych if needs more complex medication.      Relevant Medications   sertraline (ZOLOFT) 100 MG tablet   Other Relevant Orders   Ambulatory referral to [facility]    Other Visit Diagnoses    Breast lump on left side at 10 o'clock position    -  Primary   She is still nursing, will proceed with [place] of breast.   Relevant Orders   US BREAST COMPLETE UNI LEFT INC AXILLA      Total face-to-face time with patient: 25 minutes. Over 50% of encounter was spent on counseling and coordination of care. Return in about 4 weeks (around 02/10/2016) for a follow-up.  Donnamae Jude 01/13/2016 9:29 AM

## 2016-01-13 NOTE — Patient Instructions (Signed)
Major Depressive Disorder Major depressive disorder is a mental illness. It also may be called clinical depression or unipolar depression. Major depressive disorder usually causes feelings of sadness, hopelessness, or helplessness. Some people with this disorder do not feel particularly sad but lose interest in doing things they used to enjoy (anhedonia). Major depressive disorder also can cause physical symptoms. It can interfere with work, school, relationships, and other normal everyday activities. The disorder varies in severity but is longer lasting and more serious than the sadness we all feel from time to time in our lives. Major depressive disorder often is triggered by stressful life events or major life changes. Examples of these triggers include divorce, loss of your job or home, a move, and the death of a family member or close friend. Sometimes this disorder occurs for no obvious reason at all. People who have family members with major depressive disorder or bipolar disorder are at higher risk for developing this disorder, with or without life stressors. Major depressive disorder can occur at any age. It may occur just once in your life (single episode major depressive disorder). It may occur multiple times (recurrent major depressive disorder). SYMPTOMS People with major depressive disorder have either anhedonia or depressed mood on nearly a daily basis for at least 2 weeks or longer. Symptoms of depressed mood include:  Feelings of sadness (blue or down in the dumps) or emptiness.  Feelings of hopelessness or helplessness.  Tearfulness or episodes of crying (may be observed by others).  Irritability (children and adolescents). In addition to depressed mood or anhedonia or both, people with this disorder have at least four of the following symptoms:  Difficulty sleeping or sleeping too much.   Significant change (increase or decrease) in appetite or weight.   Lack of energy or  motivation.  Feelings of guilt and worthlessness.   Difficulty concentrating, remembering, or making decisions.  Unusually slow movement (psychomotor retardation) or restlessness (as observed by others).   Recurrent wishes for death, recurrent thoughts of self-harm (suicide), or a suicide attempt. People with major depressive disorder commonly have persistent negative thoughts about themselves, other people, and the world. People with severe major depressive disorder may experiencedistorted beliefs or perceptions about the world (psychotic delusions). They also may see or hear things that are not real (psychotic hallucinations). DIAGNOSIS Major depressive disorder is diagnosed through an assessment by your health care provider. Your health care provider will ask aboutaspects of your daily life, such as mood,sleep, and appetite, to see if you have the diagnostic symptoms of major depressive disorder. Your health care provider may ask about your medical history and use of alcohol or drugs, including prescription medicines. Your health care provider also may do a physical exam and blood work. This is because certain medical conditions and the use of certain substances can cause major depressive disorder-like symptoms (secondary depression). Your health care provider also may refer you to a mental health specialist for further evaluation and treatment. TREATMENT It is important to recognize the symptoms of major depressive disorder and seek treatment. The following treatments can be prescribed for this disorder:   Medicine. Antidepressant medicines usually are prescribed. Antidepressant medicines are thought to correct chemical imbalances in the brain that are commonly associated with major depressive disorder. Other types of medicine may be added if the symptoms do not respond to antidepressant medicines alone or if psychotic delusions or hallucinations occur.  Talk therapy. Talk therapy can be  helpful in treating major depressive disorder by providing   support, education, and guidance. Certain types of talk therapy also can help with negative thinking (cognitive behavioral therapy) and with relationship issues that trigger this disorder (interpersonal therapy). A mental health specialist can help determine which treatment is best for you. Most people with major depressive disorder do well with a combination of medicine and talk therapy. Treatments involving electrical stimulation of the brain can be used in situations with extremely severe symptoms or when medicine and talk therapy do not work over time. These treatments include electroconvulsive therapy, transcranial magnetic stimulation, and vagal nerve stimulation.   This information is not intended to replace advice given to you by your health care provider. Make sure you discuss any questions you have with your health care provider.   Document Released: 06/27/2012 Document Revised: 03/23/2014 Document Reviewed: 06/27/2012 Elsevier Interactive Patient Education 2016 Elsevier Inc.  

## 2016-01-13 NOTE — Assessment & Plan Note (Signed)
On very low dose Zoloft, will increase. Referral to [facility]. Consider formal referral for psych if needs more complex medication.

## 2016-01-13 NOTE — Progress Notes (Signed)
Patient scored 15 on the the PHQ-9 Depression screening questionnaire. Stephanie Hopkins RNBSN

## 2016-01-13 NOTE — Addendum Note (Signed)
Addended by: Phill Myron on: 01/13/2016 10:40 AM   Modules accepted: Orders

## 2016-01-17 ENCOUNTER — Ambulatory Visit
Admission: RE | Admit: 2016-01-17 | Discharge: 2016-01-17 | Disposition: A | Discharge status: Home / Self Care | Payer: Medicaid Other | Source: Ambulatory Visit | Attending: Family Medicine | Admitting: Family Medicine

## 2016-01-17 DIAGNOSIS — N6322 Unspecified lump in the left breast, upper inner quadrant: Secondary | ICD-10-CM

## 2016-01-20 ENCOUNTER — Institutional Professional Consult (permissible substitution): Payer: Medicaid Other

## 2016-02-10 ENCOUNTER — Ambulatory Visit: Payer: Medicaid Other | Admitting: Family Medicine

## 2016-02-25 ENCOUNTER — Other Ambulatory Visit: Payer: Self-pay

## 2016-02-25 DIAGNOSIS — F418 Other specified anxiety disorders: Secondary | ICD-10-CM

## 2016-02-25 MED ORDER — SERTRALINE HCL 100 MG PO TABS: 100.0000 mg | ORAL_TABLET | Freq: Every day | ORAL | 3 refills | Status: DC

## 2016-02-25 NOTE — Progress Notes (Signed)
Patient called and states that she was seen in Oct and Dr. Kennon Rounds was increasing her Zoloft to 100mg  a day. Patient states when she filled script they were 50 mg tablets and the pharmacy wouldn't refill cause she took two 50mg  tablets a day. Verified pharmacy and sent new RX to match the script that Dr. Kennon Rounds place in her notes on 01-13-16 for patient to increase her dose of Zoloft to 100mg  a day. Kathrene Alu RNBSN

## 2016-07-05 ENCOUNTER — Other Ambulatory Visit: Payer: Self-pay | Admitting: Family Medicine

## 2016-07-05 DIAGNOSIS — F418 Other specified anxiety disorders: Secondary | ICD-10-CM

## 2016-07-08 ENCOUNTER — Ambulatory Visit: Payer: Medicaid Other | Admitting: Obstetrics & Gynecology

## 2016-07-08 DIAGNOSIS — J301 Allergic rhinitis due to pollen: Secondary | ICD-10-CM | POA: Insufficient documentation

## 2016-12-13 ENCOUNTER — Encounter (HOSPITAL_BASED_OUTPATIENT_CLINIC_OR_DEPARTMENT_OTHER): Payer: Self-pay | Admitting: Emergency Medicine

## 2016-12-13 ENCOUNTER — Emergency Department (HOSPITAL_BASED_OUTPATIENT_CLINIC_OR_DEPARTMENT_OTHER)
Admission: EM | Admit: 2016-12-13 | Discharge: 2016-12-13 | Disposition: A | Discharge status: Home / Self Care | Payer: Self-pay | Attending: Emergency Medicine | Admitting: Emergency Medicine

## 2016-12-13 ENCOUNTER — Emergency Department (HOSPITAL_BASED_OUTPATIENT_CLINIC_OR_DEPARTMENT_OTHER): Payer: Self-pay

## 2016-12-13 DIAGNOSIS — Z79899 Other long term (current) drug therapy: Secondary | ICD-10-CM | POA: Insufficient documentation

## 2016-12-13 DIAGNOSIS — M79605 Pain in left leg: Secondary | ICD-10-CM | POA: Insufficient documentation

## 2016-12-13 LAB — CBC WITH DIFFERENTIAL/PLATELET
BASOS PCT: 0 %
Basophils Absolute: 0 10*3/uL (ref 0.0–0.1)
EOS ABS: 0.2 10*3/uL (ref 0.0–0.7)
Eosinophils Relative: 2 %
HCT: 39.5 % (ref 36.0–46.0)
HEMOGLOBIN: 13.2 g/dL (ref 12.0–15.0)
LYMPHS ABS: 2.8 10*3/uL (ref 0.7–4.0)
Lymphocytes Relative: 34 %
MCH: 28.4 pg (ref 26.0–34.0)
MCHC: 33.4 g/dL (ref 30.0–36.0)
MCV: 84.9 fL (ref 78.0–100.0)
MONOS PCT: 5 %
Monocytes Absolute: 0.4 10*3/uL (ref 0.1–1.0)
NEUTROS PCT: 59 %
Neutro Abs: 4.9 10*3/uL (ref 1.7–7.7)
Platelets: 330 10*3/uL (ref 150–400)
RBC: 4.65 MIL/uL (ref 3.87–5.11)
RDW: 13.2 % (ref 11.5–15.5)
WBC: 8.3 10*3/uL (ref 4.0–10.5)

## 2016-12-13 LAB — BASIC METABOLIC PANEL
Anion gap: 8 (ref 5–15)
BUN: 16 mg/dL (ref 6–20)
CALCIUM: 9.4 mg/dL (ref 8.9–10.3)
CHLORIDE: 103 mmol/L (ref 101–111)
CO2: 25 mmol/L (ref 22–32)
CREATININE: 0.69 mg/dL (ref 0.44–1.00)
GFR calc non Af Amer: >60 mL/min (ref >60)
Glucose, Bld: 103 mg/dL — ABNORMAL HIGH (ref 65–99)
Potassium: 3.7 mmol/L (ref 3.5–5.1)
SODIUM: 136 mmol/L (ref 135–145)

## 2016-12-13 LAB — D-DIMER, QUANTITATIVE: D-Dimer, Quant: 0.31 ug/mL-FEU (ref 0.00–0.50)

## 2016-12-13 LAB — TROPONIN I

## 2016-12-13 MED ORDER — NAPROXEN 500 MG PO TABS
500.0000 mg | ORAL_TABLET | Freq: Two times a day (BID) | ORAL | 0 refills | Status: DC
Start: 2016-12-13 — End: 2019-08-21

## 2016-12-13 MED ORDER — CYCLOBENZAPRINE HCL 10 MG PO TABS: 10.0000 mg | ORAL_TABLET | Freq: Two times a day (BID) | ORAL | 0 refills | Status: DC | PRN

## 2016-12-13 NOTE — ED Notes (Signed)
Alert, NAD, calm, interactive, resps e/u, speaking in clear complete sentences, no dyspnea noted, skin W&D, c/o L upper leg pain, diffuse, difficult to pinpoint, localizes pain to medial, anterior and lateral, less posterior, from knee to upper thigh ("not buttocks, not groin, not hip, not like sciatica"), mentions some numbness in toes intermittently, h/o sciatica with pregnancy.

## 2016-12-13 NOTE — ED Provider Notes (Signed)
[facility] Provider Note   CSN: [id number] Arrival date & time: 12/13/16  1831     History   Chief Complaint Chief Complaint  Patient presents with  . Leg Pain    HPI Stephanie Hopkins is a 31 y.o. female.  HPI Patient, with past medical history of anxiety, presents to ED for one week history of left leg pain, calf pain and swelling. She also has been experiencing intermittent chest pain and shortness of breath worse with walking short distances. He states that the chest pain is intermittent and lasts for a few seconds at a time. She is concerned that she has a blood clot in her leg due to the intermittent pain she has felt. Denies any previous history of DVT, PE. She states that she has also been feeling palpitations. Unsure if this is related to her anxiety. She denies any recent surgeries, hemoptysis, birth control use, recent prolonged travel.  Past Medical History:  Diagnosis Date  . Colitis   . GERD (gastroesophageal reflux disease)    Zantac    . Gestational diabetes   . History of gestational diabetes 09/25/2014   Diet Controlled; 76%ile at 38 wks   . History of postpartum depression 07/06/2014  . Supervision of normal pregnancy 09/14/2011   [facility] (transfer from [place]) Genetic Screen  Reports negative  Anatomic [place]  Normal @ 20 wks; poor visual of spine>rescan nml  Glucose Screen 150 >> needs 3 hr GTT  GBS   Feeding Preference  Breast  Contraception   Circumcision         Patient Active Problem List   Diagnosis Date Noted  . History of gestational diabetes 09/25/2014  . Depression with anxiety 07/06/2014    Past Surgical History:  Procedure Laterality Date  . CESAREAN SECTION  01/30/2012   Procedure: CESAREAN SECTION;  Surgeon: Jonnie Kind, MD;  Location: [facility];  Service: Gynecology;  Laterality: N/A;  Primary Cesarean Section Delivery Baby Boy @ [phone], Apgars 4/9   . CESAREAN SECTION N/A 12/04/2014   Procedure:  CESAREAN SECTION;  Surgeon: Lavonia Drafts, MD;  Location: [facility];  Service: Obstetrics;  Laterality: N/A;  Requested 12/03/14 @4 :00p  . MANDIBLE SURGERY  31 yo   Missing bone  . WISDOM TOOTH EXTRACTION  2017    OB History    Gravida Para Term Preterm AB Living   4 2 2   2 2    SAB TAB Ectopic Multiple Live Births   1     0 2      Obstetric Comments   Gestational Diabetes        Home Medications    Prior to Admission medications   Medication Sig Start Date End Date Taking? Authorizing Provider  acetaminophen (TYLENOL) 500 MG tablet Take 1,000 mg by mouth every 6 (six) hours as needed for mild pain or headache.     [provider]  cetirizine (ZYRTEC) 10 MG tablet Take 10 mg by mouth daily.    [provider]  cyclobenzaprine (FLEXERIL) 10 MG tablet Take 1 tablet (10 mg total) by mouth 2 (two) times daily as needed for muscle spasms. 12/13/16   Madelyne Millikan, PA-C  diphenhydrAMINE (BENADRYL) 25 mg capsule Take 1 capsule (25 mg total) by mouth every 6 (six) hours as needed. 08/27/15   Tanna Furry, MD  esomeprazole (NEXIUM) 40 MG capsule Take 40 mg by mouth. 01/08/16 01/07/17  [provider]  fluticasone (FLONASE) 50 MCG/ACT  nasal spray Place into the nose.    [provider]  metoCLOPramide (REGLAN) 5 MG tablet Take 5 mg by mouth. 11/20/15   [provider]  naproxen (NAPROSYN) 500 MG tablet Take 1 tablet (500 mg total) by mouth 2 (two) times daily. 12/13/16   Jadyn Brasher, PA-C  ondansetron (ZOFRAN ODT) 4 MG disintegrating tablet Take 1 tablet (4 mg total) by mouth every 8 (eight) hours as needed for nausea. 08/27/15   Tanna Furry, MD  Prenatal Vit-Fe Fumarate-FA (MULTIVITAMIN-PRENATAL) 27-0.8 MG TABS Take 1 tablet by mouth daily.    [provider]  ranitidine (ZANTAC) 150 MG capsule Take 150 mg by mouth. 01/08/16 01/07/17  [provider]  sertraline (ZOLOFT) 100 MG tablet TAKE ONE TABLET BY MOUTH ONCE DAILY 07/06/16    Donnamae Jude, MD    Family History Family History  Problem Relation Age of Onset  . Hypertension Mother     Social History Social History  Substance Use Topics  . Smoking status: Former Research scientist (life sciences)  . Smokeless tobacco: Never Used  . Alcohol use No     Allergies   Sulfa antibiotics; Adhesive [tape]; and Bactrim [sulfamethoxazole-trimethoprim]   Review of Systems Review of Systems  Constitutional: Negative for appetite change, chills and fever.  HENT: Negative for ear pain, rhinorrhea, sneezing and sore throat.   Eyes: Negative for photophobia and visual disturbance.  Respiratory: Positive for shortness of breath. Negative for cough, chest tightness and wheezing.   Cardiovascular: Positive for chest pain, palpitations and leg swelling.  Gastrointestinal: Negative for abdominal pain, blood in stool, constipation, diarrhea, nausea and vomiting.  Genitourinary: Negative for dysuria, hematuria and urgency.  Musculoskeletal: Positive for arthralgias. Negative for myalgias.  Skin: Negative for rash.  Neurological: Negative for dizziness, weakness, light-headedness and numbness.     Physical Exam Updated Vital Signs BP 111/81   Pulse 79   Temp 98 F (36.7 C) (Oral)   Resp (!) 21   Ht 5\' 4"  (1.626 m)   Wt 90.7 kg (200 lb)   LMP 12/11/2016   SpO2 100%   BMI 34.33 kg/m   Physical Exam  Constitutional: She appears well-developed and well-nourished. No distress.  HENT:  Head: Normocephalic and atraumatic.  Nose: Nose normal.  Eyes: Conjunctivae and EOM are normal. Left eye exhibits no discharge. No scleral icterus.  Neck: Normal range of motion. Neck supple.  Cardiovascular: Regular rhythm, normal heart sounds and intact distal pulses.  Tachycardia present.  Exam reveals no gallop and no friction rub.   No murmur heard. Pulmonary/Chest: Effort normal and breath sounds normal. No respiratory distress.  Abdominal: Soft. Bowel sounds are normal. She exhibits no distension.  There is no tenderness. There is no guarding.  Musculoskeletal: Normal range of motion. She exhibits no edema.  Left calf tenderness. No visible color or temperature change noted. Full active and passive range of motion of the knee.  Neurological: She is alert. She exhibits normal muscle tone. Coordination normal.  Skin: Skin is warm and dry. No rash noted.  Psychiatric: She has a normal mood and affect.  Nursing note and vitals reviewed.    ED Treatments / Results  Labs (all labs ordered are listed, but only abnormal results are displayed) Labs Reviewed  BASIC METABOLIC PANEL - Abnormal; Notable for the following:       Result Value   Glucose, Bld 103 (*)    All other components within normal limits  TROPONIN I  CBC WITH DIFFERENTIAL/PLATELET  D-DIMER, QUANTITATIVE (  NOT AT [facility])    EKG  EKG Interpretation  Date/Time:  Sunday December 13 2016 20:24:45 EDT Ventricular Rate:  81 PR Interval:    QRS Duration: 95 QT Interval:  376 QTC Calculation: 437 R Axis:   34 Text Interpretation:  Sinus rhythm Low voltage, precordial leads No significant change since last tracing Confirmed by Deno Etienne [phone]) on 12/13/2016 10:01:56 PM       Radiology US Venous Img Lower Unilateral Left  Result Date: 12/13/2016 CLINICAL DATA:  Left upper leg pain with swelling 1 week. EXAM: LEFT LOWER EXTREMITY VENOUS DOPPLER ULTRASOUND TECHNIQUE: Gray-scale sonography with graded compression, as well as color Doppler and duplex ultrasound were performed to evaluate the lower extremity deep venous systems from the level of the common femoral vein and including the common femoral, femoral, profunda femoral, popliteal and calf veins including the posterior tibial, peroneal and gastrocnemius veins when visible. The superficial great saphenous vein was also interrogated. Spectral Doppler was utilized to evaluate flow at rest and with distal augmentation maneuvers in the common femoral, femoral and popliteal  veins. COMPARISON:  None. FINDINGS: Contralateral Common Femoral Vein: Respiratory phasicity is normal and symmetric with the symptomatic side. No evidence of thrombus. Normal compressibility. Common Femoral Vein: No evidence of thrombus. Normal compressibility, respiratory phasicity and response to augmentation. Saphenofemoral Junction: No evidence of thrombus. Normal compressibility and flow on color Doppler imaging. Profunda Femoral Vein: No evidence of thrombus. Normal compressibility and flow on color Doppler imaging. Femoral Vein: No evidence of thrombus. Normal compressibility, respiratory phasicity and response to augmentation. Popliteal Vein: No evidence of thrombus. Normal compressibility, respiratory phasicity and response to augmentation. Calf Veins: No evidence of thrombus. Normal compressibility and flow on color Doppler imaging. Superficial Great Saphenous Vein: No evidence of thrombus. Normal compressibility and flow on color Doppler imaging. Venous Reflux:  None. Other Findings:  None. IMPRESSION: No evidence of DVT within the left lower extremity. Electronically Signed   By: Marin Olp M.D.   On: 12/13/2016 21:05    Procedures Procedures (including critical care time)  Medications Ordered in ED Medications - No data to display   Initial Impression / Assessment and Plan / ED Course  I have reviewed the triage vital signs and the nursing notes.  Pertinent labs & imaging results that were available during my care of the patient were reviewed by me and considered in my medical decision making (see chart for details).     Patient presents to ED for evaluation of the left leg pain for one week. She also been experiencing intermittent chest pain or shortness of breath worse with walking short distances. Denies any previous history of DVT, PE. There is tenderness to palpation of the left calf but no visible swelling, edema or color change noted. She is mildly tachycardic but otherwise  in no acute distress and nontoxic-appearing. She is afebrile history of fever. She denies any injuries to the area. She does appear anxious. Ultrasound of lower extremity returned as negative. Lab work including troponin, d-dimer, CBC and BMP were unremarkable. EKG with no ischemic changes. Symptoms likely due to muscle strain in the area. I have low suspicion for cardiac pulmonary cause for her chest pain based on today's workup. She does appear anxious so I suspect that this could play a part as well. She looked very relieved once I told her her lab work and imaging returned as normal. We'll give him muscle relaxer and anti-inflammatories to be taken as needed. Advised to follow-up  with PCP for further evaluation. Patient appears stable for discharge at this time. Strict return precautions given.  Final Clinical Impressions(s) / ED Diagnoses   Final diagnoses:  Left leg pain    New Prescriptions New Prescriptions   CYCLOBENZAPRINE (FLEXERIL) 10 MG TABLET    Take 1 tablet (10 mg total) by mouth 2 (two) times daily as needed for muscle spasms.   NAPROXEN (NAPROSYN) 500 MG TABLET    Take 1 tablet (500 mg total) by mouth 2 (two) times daily.     Delia Heady, PA-C 12/13/16 [place], [place], DO 12/13/16 2238

## 2016-12-13 NOTE — ED Notes (Signed)
EDPA into room, prior to RN assessment, see PA notes, pending orders.   

## 2016-12-13 NOTE — ED Notes (Signed)
Pt not in room, pt in [place].

## 2016-12-13 NOTE — ED Triage Notes (Signed)
Patient states that she is having pain to her left upper leg region. Reports that it starts at her knee and radiates up. Also reports new SOB intermittently and palpitations that she feels is a panic attack at home - denies any at this time.

## 2016-12-13 NOTE — ED Notes (Signed)
Delay in EKG due to pt taken to ultrasound

## 2016-12-13 NOTE — Discharge Instructions (Signed)
Please read attached information regarding her condition. Take naproxen and Flexeril as needed. Follow-up with her PCP for further evaluation. Return to ED for worsening leg pain, numbness, increasing chest pain or pressure, trouble breathing, injuries or falls.

## 2017-02-26 ENCOUNTER — Other Ambulatory Visit (HOSPITAL_COMMUNITY): Payer: Self-pay | Admitting: *Deleted

## 2017-02-26 DIAGNOSIS — N6320 Unspecified lump in the left breast, unspecified quadrant: Secondary | ICD-10-CM

## 2017-03-25 ENCOUNTER — Other Ambulatory Visit (HOSPITAL_COMMUNITY): Payer: Self-pay | Admitting: Obstetrics and Gynecology

## 2017-03-25 ENCOUNTER — Ambulatory Visit
Admission: RE | Admit: 2017-03-25 | Discharge: 2017-03-25 | Disposition: A | Discharge status: Home / Self Care | Payer: No Typology Code available for payment source | Source: Ambulatory Visit | Attending: Obstetrics and Gynecology | Admitting: Obstetrics and Gynecology

## 2017-03-25 ENCOUNTER — Ambulatory Visit (HOSPITAL_COMMUNITY)
Admission: RE | Admit: 2017-03-25 | Discharge: 2017-03-25 | Disposition: A | Discharge status: Home / Self Care | Payer: Self-pay | Source: Ambulatory Visit | Attending: Obstetrics and Gynecology | Admitting: Obstetrics and Gynecology

## 2017-03-25 ENCOUNTER — Encounter (HOSPITAL_COMMUNITY): Payer: Self-pay

## 2017-03-25 VITALS — BP 124/80 | Temp 98.2°F | Ht 64.0 in

## 2017-03-25 DIAGNOSIS — Z1239 Encounter for other screening for malignant neoplasm of breast: Secondary | ICD-10-CM

## 2017-03-25 DIAGNOSIS — N6452 Nipple discharge: Secondary | ICD-10-CM

## 2017-03-25 DIAGNOSIS — N6310 Unspecified lump in the right breast, unspecified quadrant: Secondary | ICD-10-CM

## 2017-03-25 DIAGNOSIS — N6320 Unspecified lump in the left breast, unspecified quadrant: Secondary | ICD-10-CM

## 2017-03-25 DIAGNOSIS — N6321 Unspecified lump in the left breast, upper outer quadrant: Secondary | ICD-10-CM

## 2017-03-25 HISTORY — DX: Anxiety disorder, unspecified: F41.9

## 2017-03-25 HISTORY — DX: Migraine, unspecified, not intractable, without status migrainosus: G43.909

## 2017-03-25 NOTE — Progress Notes (Signed)
Complaints of left breast lump since July that is painful at times. Complaints of right outer breast sharp pain that comes and goes. Patient rates the pain at a 4-5 out of 10. Complaints of bilateral breast milky discharge that was orange colored from the right breast.  Pap Smear: Pap smear not completed today. Last Pap smear was 01/21/2015 at the [facility] and normal. Per patient has no history of an abnormal Pap smear. Last Pap smear result is in Epic.  Physical exam: Breasts Right breast slightly larger than left breast that per patient is normal for her. No skin abnormalities left breast. Observed a healing bruise on the right breast at 12 o'clock above the areola. No nipple retraction bilateral breasts. Bilateral milky discharge expressed from breast on exam. Sample of bilateral breast discharge sent to cytology for evaluation. No lymphadenopathy. No lumps palpated right breast. Palpated a lump within the left breast at 1 o'clock 10.5 cm from the nipple. Complaints of tenderness when palpated lump and area around lump. Referred patient to the [facility] for diagnostic mammogram and possible left breast ultrasound. Appointment scheduled for Thursday, March 25, 2017 following [facility] appointment.        Pelvic/Bimanual No Pap smear completed today since last Pap smear was 01/21/2015. Pap smear not indicated per [facility] guidelines.   Smoking History: Patient is a former smoker that stated she quit 7 years ago.  Patient Navigation: Patient education provided. Access to services provided for patient through [facility] program.

## 2017-03-25 NOTE — Patient Instructions (Signed)
Explained breast self awareness with Marquis Lunch. Patient did not need a Pap smear today due to last Pap smear was 01/21/2015. Let her know [facility] will cover Pap smears every 3 years unless has a history of abnormal Pap smears. Referred patient to the [facility] for diagnostic mammogram and possible left breast ultrasound. Appointment scheduled for Thursday, March 25, 2017 following [facility] appointment. Let patient know will follow up with her within the next couple weeks with results of breast discharge by phone. Stephanie Hopkins verbalized understanding.  Tracye Szuch, Arvil [place], RN 12:30 PM

## 2017-03-31 ENCOUNTER — Encounter (HOSPITAL_COMMUNITY): Payer: Self-pay | Admitting: *Deleted

## 2017-04-06 ENCOUNTER — Telehealth (HOSPITAL_COMMUNITY): Payer: Self-pay | Admitting: *Deleted

## 2017-04-06 NOTE — Telephone Encounter (Signed)
Telephoned patient at home number and no voicemail box set up. Calling to advised patient that appointment set up at [facility] with Dr Marlou Starks Monday Feb 4 9:40.

## 2017-04-07 ENCOUNTER — Ambulatory Visit (INDEPENDENT_AMBULATORY_CARE_PROVIDER_SITE_OTHER): Payer: BLUE CROSS/BLUE SHIELD

## 2017-04-07 ENCOUNTER — Encounter: Payer: Self-pay | Admitting: Obstetrics & Gynecology

## 2017-04-07 ENCOUNTER — Ambulatory Visit (INDEPENDENT_AMBULATORY_CARE_PROVIDER_SITE_OTHER): Payer: BLUE CROSS/BLUE SHIELD | Admitting: Obstetrics & Gynecology

## 2017-04-07 VITALS — BP 122/78 | HR 81 | Ht 64.0 in | Wt 201.0 lb

## 2017-04-07 DIAGNOSIS — F3281 Premenstrual dysphoric disorder: Secondary | ICD-10-CM

## 2017-04-07 DIAGNOSIS — R102 Pelvic and perineal pain: Secondary | ICD-10-CM | POA: Diagnosis not present

## 2017-04-07 DIAGNOSIS — E349 Endocrine disorder, unspecified: Secondary | ICD-10-CM | POA: Diagnosis not present

## 2017-04-07 DIAGNOSIS — F419 Anxiety disorder, unspecified: Secondary | ICD-10-CM

## 2017-04-07 DIAGNOSIS — F329 Major depressive disorder, single episode, unspecified: Secondary | ICD-10-CM

## 2017-04-07 DIAGNOSIS — F32A Depression, unspecified: Secondary | ICD-10-CM

## 2017-04-07 DIAGNOSIS — R5383 Other fatigue: Secondary | ICD-10-CM

## 2017-04-07 DIAGNOSIS — Z3202 Encounter for pregnancy test, result negative: Secondary | ICD-10-CM | POA: Diagnosis not present

## 2017-04-07 LAB — POCT URINE PREGNANCY: Preg Test, Ur: NEGATIVE

## 2017-04-07 MED ORDER — NORETHIN ACE-ETH ESTRAD-FE 1-20 MG-MCG(24) PO TABS: 1.0000 | ORAL_TABLET | Freq: Every day | ORAL | 11 refills | Status: DC

## 2017-04-07 NOTE — Progress Notes (Signed)
u

## 2017-04-07 NOTE — Progress Notes (Signed)
   Subjective:    Patient ID: Stephanie Hopkins, female    DOB: 09/24/1985, 32 y.o.   MRN: [id number]  HPI  32 yo female presents for depression anxiety and feeling hormonal (possible PMDD).  Pt on Zoloft, self decreased from 100 mg.  Pt reports not feeling right still.  Some depression and anxiety continues.  No HI nor SI but has felt this in the past.  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  She wonders if it is her hormones and wants them tested.  Pt weary about taking antidepressants.   Pt says depression is worse the two weeks before her menses starts.  She wonders if she has PMDD.  She also has some pelvic pain and wonders if she has ovarian cysts.    Review of Systems  Constitutional: Positive for fatigue.  Respiratory: Negative.   Cardiovascular: Negative.   Gastrointestinal: Positive for abdominal distention.  Endocrine: Negative.   Genitourinary: Positive for pelvic pain.  Musculoskeletal: Negative.   Neurological: Negative.   Psychiatric/Behavioral: Positive for agitation and dysphoric mood. The patient is nervous/anxious.        Objective:   Physical Exam  Constitutional: She is oriented to person, place, and time. She appears well-developed and well-nourished. No distress.  HENT:  Head: Normocephalic and atraumatic.  Eyes: Conjunctivae are normal.  Cardiovascular: Normal rate.  Pulmonary/Chest: Effort normal.  Abdominal: Soft. Bowel sounds are normal. She exhibits no distension and no mass. There is no tenderness. There is no rebound and no guarding.  Genitourinary:  Genitourinary Comments: Tanner V Vagina:  Pink ml rugae Cervix:  No CMT, no lesion Uterus:  Nml size, mobile, NT Adnexa:  Both ovaries palpated and not tender.  No masses  Musculoskeletal: She exhibits no edema.    Neurological: She is alert and oriented to person, place, and time.  Skin: Skin is warm and dry.  Psychiatric: She has a normal mood and affect.  Vitals reviewed.  Vitals:   04/07/17 0916  BP: 122/78  Pulse: 81  Weight: 201 lb (91.2 kg)  Height: 5\' 4"  (1.626 m)    Assessment & Plan:  32 yo female with depression , anxiety, and some level of PMDD, mild pelvic pain  1.  Long discussion about how we do not do hormone testing with the saliva for progesterone, cortisol, and estrogen.  Since she is menstruating female (monthly), testing for estrogen/progeesteron.  However, there are integrative MDs that do this testing. (Dr. Tye Savoy).   2.  Pt wants Rx for OCPs incase she doesn't like evaluation by integrative MD.  Pt aware of risks with birth control pills.   3.  TVUS to evaluate pelvic pain 4.  Referral to psych for medication management and counseling. 5.  Pt to follow up as needed  40 minutes spent face to face with patient with >50% counseling

## 2017-04-09 ENCOUNTER — Telehealth (HOSPITAL_COMMUNITY): Payer: Self-pay | Admitting: *Deleted

## 2017-04-09 NOTE — Telephone Encounter (Signed)
Patient called back requesting an earlier follow-up appointment. [facility] and patient was given the first new patient appointment. [facility] added patient to the wait list if they have any cancellations. Called patient back and let her know. Told patient she can always feel free to call to check if there are any cancellations. Patient verbalized understanding.

## 2017-04-09 NOTE — Telephone Encounter (Signed)
Telephoned patient at home number and advised patient of results of breast discharge. Advised patient because of results would need consult with surgeon. Advised patient of appointment with Dr Marlou Starks on Monday February 4 9:40. Patient voiced understanding.

## 2017-04-09 NOTE — Telephone Encounter (Signed)
Patient called with questions about her breast discharge. Explained to patient the left breast discharge is benign. Explained to patient that the right breast discharge showed some abnormal cells and that it is recommended that she follow-up with a surgeon. Patient concerned it showed cancer. Explained to patient that the result did not directly show malignant cells but it did show abnormal cells that need to be followed-up based on recommendations. Informed patient that it is important she follows up with the surgeon to determine if any additional follow-up is needed. An appointment has been made for April 19, 2017 at [facility]. Explained to patient that [facility] will cover the cost of the appointment. Patient requested her results to be emailed. Let patient know that due to HIPPA we are unable to email her results. Told patient if she would like a copy of the results that she will need to complete a consent to release her information. Patient verbalized understanding.

## 2017-04-20 ENCOUNTER — Encounter: Payer: Self-pay | Admitting: Obstetrics and Gynecology

## 2017-05-03 ENCOUNTER — Ambulatory Visit (HOSPITAL_COMMUNITY): Payer: BLUE CROSS/BLUE SHIELD | Admitting: Psychiatry

## 2017-05-11 ENCOUNTER — Ambulatory Visit (HOSPITAL_COMMUNITY): Payer: BLUE CROSS/BLUE SHIELD | Admitting: Licensed Clinical Social Worker

## 2017-06-30 ENCOUNTER — Telehealth: Payer: Self-pay | Admitting: *Deleted

## 2017-06-30 ENCOUNTER — Other Ambulatory Visit: Payer: BLUE CROSS/BLUE SHIELD

## 2017-06-30 ENCOUNTER — Telehealth: Payer: Self-pay | Admitting: Obstetrics & Gynecology

## 2017-06-30 DIAGNOSIS — N898 Other specified noninflammatory disorders of vagina: Secondary | ICD-10-CM

## 2017-06-30 NOTE — Progress Notes (Signed)
Pt here with c/o's of vaginal odor.  She will do self-swab and send urine for culture.

## 2017-06-30 NOTE — Addendum Note (Signed)
Addended by: Asencion Islam on: 06/30/2017 02:34 PM   Modules accepted: Orders

## 2017-06-30 NOTE — Telephone Encounter (Addendum)
Pt called wanting seen today for symptoms of BV pt states discharge. Please advise if can come for nurse visit today for swab for BV. Per RN Zacarias Pontes ok to schedule nurse visit for today. Made appt with pt on phone.

## 2017-07-01 ENCOUNTER — Telehealth: Payer: Self-pay

## 2017-07-01 ENCOUNTER — Encounter: Payer: Self-pay | Admitting: *Deleted

## 2017-07-01 LAB — URINE CULTURE
MICRO NUMBER:: 90473249
SPECIMEN QUALITY:: ADEQUATE

## 2017-07-01 LAB — WET PREP FOR TRICH, YEAST, CLUE
MICRO NUMBER: 90473245
SPECIMEN QUALITY 3963: ADEQUATE

## 2017-07-01 NOTE — Telephone Encounter (Signed)
Error

## 2017-07-01 NOTE — Telephone Encounter (Signed)
Spoke with pt and she is aware of negative wet prep results. Pt would like to speak to doctor about vaginal discharge- appt made.

## 2017-07-14 ENCOUNTER — Ambulatory Visit: Payer: BLUE CROSS/BLUE SHIELD | Admitting: Obstetrics & Gynecology

## 2017-07-20 ENCOUNTER — Encounter: Payer: Self-pay | Admitting: Obstetrics & Gynecology

## 2017-07-20 ENCOUNTER — Ambulatory Visit (INDEPENDENT_AMBULATORY_CARE_PROVIDER_SITE_OTHER): Payer: BLUE CROSS/BLUE SHIELD | Admitting: Obstetrics & Gynecology

## 2017-07-20 VITALS — BP 129/79 | HR 99 | Ht 65.0 in | Wt 196.0 lb

## 2017-07-20 DIAGNOSIS — Z1151 Encounter for screening for human papillomavirus (HPV): Secondary | ICD-10-CM

## 2017-07-20 DIAGNOSIS — Z124 Encounter for screening for malignant neoplasm of cervix: Secondary | ICD-10-CM

## 2017-07-20 DIAGNOSIS — F418 Other specified anxiety disorders: Secondary | ICD-10-CM

## 2017-07-20 DIAGNOSIS — Z01419 Encounter for gynecological examination (general) (routine) without abnormal findings: Secondary | ICD-10-CM

## 2017-07-20 DIAGNOSIS — F3289 Other specified depressive episodes: Secondary | ICD-10-CM

## 2017-07-20 MED ORDER — SERTRALINE HCL 100 MG PO TABS: 100.0000 mg | ORAL_TABLET | Freq: Every day | ORAL | 1 refills | Status: DC

## 2017-07-20 NOTE — Progress Notes (Signed)
Subjective:    Stephanie Hopkins is a 32 y.o.P2 female who presents for an annual exam. She requests a refill of zoloft. She is still feeling depressed and anxious. She denies SI/HI. The patient is sexually active. GYN screening history: last pap: was normal. The patient wears seatbelts: yes. The patient participates in regular exercise: no. Has the patient ever been transfused or tattooed?: not asked. The patient reports that there is not domestic violence in her life.   Menstrual History: OB History    Gravida  4   Para  2   Term  2   Preterm      AB  2   Living  2     SAB  1   TAB      Ectopic      Multiple  0   Live Births  2        Obstetric Comments  Gestational Diabetes          Patient's last menstrual period was 07/16/2017.    The following portions of the patient's history were reviewed and updated as appropriate: allergies, current medications, past family history, past medical history, past social history, past surgical history and problem list.  Review of Systems Pertinent items are noted in HPI.   She uses condoms for contraception. She tried the Mirena and OCPs but didn't like them. She is not interested in a BTL at this time.   Objective:    BP 129/79   Pulse 99   Ht 5\' 5"  (1.651 m)   Wt 196 lb (88.9 kg)   LMP 07/16/2017   BMI 32.62 kg/m   General Appearance:    Alert, cooperative, no distress, appears stated age  Head:    Normocephalic, without obvious abnormality, atraumatic  Eyes:    PERRL, conjunctiva/corneas clear, EOM's intact, fundi    benign, both eyes  Ears:    Normal TM's and external ear canals, both ears  Nose:   Nares normal, septum midline, mucosa normal, no drainage    or sinus tenderness  Throat:   Lips, mucosa, and tongue normal; teeth and gums normal  Neck:   Supple, symmetrical, trachea midline, no adenopathy;    thyroid:  no enlargement/tenderness/nodules; no carotid   bruit or JVD  Back:     Symmetric, no curvature,  ROM normal, no CVA tenderness  Lungs:     Clear to auscultation bilaterally, respirations unlabored  Chest Wall:    No tenderness or deformity   Heart:    Regular rate and rhythm, S1 and S2 normal, no murmur, rub   or gallop  Breast Exam:    No tenderness, masses, or nipple abnormality  Abdomen:     Soft, non-tender, bowel sounds active all four quadrants,    no masses, no organomegaly  Genitalia:    Normal female without lesion, discharge or tenderness     Extremities:   Extremities normal, atraumatic, no cyanosis or edema  Pulses:   2+ and symmetric all extremities  Skin:   Skin color, texture, turgor normal, no rashes or lesions  Lymph nodes:   Cervical, supraclavicular, and axillary nodes normal  Neurologic:   CNII-XII intact, normal strength, sensation and reflexes    throughout  .    Assessment:    Healthy female exam.    Plan:     Thin prep Pap smear. with cotesting Refer to psychiatry

## 2017-07-21 LAB — CYTOLOGY - PAP
Diagnosis: NEGATIVE
HPV: NOT DETECTED

## 2017-07-26 ENCOUNTER — Telehealth: Payer: Self-pay

## 2017-07-26 NOTE — Telephone Encounter (Signed)
PT called asking for pap smear results. Pt is aware that pap smear was normal.

## 2017-08-05 ENCOUNTER — Ambulatory Visit (INDEPENDENT_AMBULATORY_CARE_PROVIDER_SITE_OTHER): Payer: BLUE CROSS/BLUE SHIELD | Admitting: Psychiatry

## 2017-08-05 ENCOUNTER — Other Ambulatory Visit: Payer: Self-pay

## 2017-08-05 ENCOUNTER — Encounter (HOSPITAL_COMMUNITY): Payer: Self-pay | Admitting: Psychiatry

## 2017-08-05 VITALS — BP 124/80 | HR 71 | Ht 64.0 in | Wt 194.0 lb

## 2017-08-05 DIAGNOSIS — F418 Other specified anxiety disorders: Secondary | ICD-10-CM | POA: Diagnosis not present

## 2017-08-05 DIAGNOSIS — Z818 Family history of other mental and behavioral disorders: Secondary | ICD-10-CM | POA: Diagnosis not present

## 2017-08-05 DIAGNOSIS — F331 Major depressive disorder, recurrent, moderate: Secondary | ICD-10-CM | POA: Diagnosis not present

## 2017-08-05 DIAGNOSIS — F411 Generalized anxiety disorder: Secondary | ICD-10-CM

## 2017-08-05 DIAGNOSIS — Z87891 Personal history of nicotine dependence: Secondary | ICD-10-CM

## 2017-08-05 MED ORDER — SERTRALINE HCL 100 MG PO TABS: 150.0000 mg | ORAL_TABLET | Freq: Every day | ORAL | 0 refills | Status: DC
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
## 2017-08-16 ENCOUNTER — Ambulatory Visit (HOSPITAL_COMMUNITY): Payer: Self-pay | Admitting: Licensed Clinical Social Worker

## 2017-08-18 ENCOUNTER — Ambulatory Visit (HOSPITAL_COMMUNITY): Payer: BLUE CROSS/BLUE SHIELD | Admitting: Licensed Clinical Social Worker

## 2017-08-27 ENCOUNTER — Ambulatory Visit (HOSPITAL_COMMUNITY): Payer: BLUE CROSS/BLUE SHIELD | Admitting: Psychiatry

## 2017-10-05 ENCOUNTER — Encounter (HOSPITAL_COMMUNITY): Payer: Self-pay | Admitting: *Deleted

## 2017-10-05 ENCOUNTER — Ambulatory Visit
Admission: RE | Admit: 2017-10-05 | Discharge: 2017-10-05 | Disposition: A | Discharge status: Home / Self Care | Payer: BLUE CROSS/BLUE SHIELD | Source: Ambulatory Visit | Attending: Obstetrics and Gynecology | Admitting: Obstetrics and Gynecology

## 2017-10-05 DIAGNOSIS — N6310 Unspecified lump in the right breast, unspecified quadrant: Secondary | ICD-10-CM

## 2017-10-12 ENCOUNTER — Other Ambulatory Visit (HOSPITAL_COMMUNITY): Payer: Self-pay

## 2017-10-12 ENCOUNTER — Other Ambulatory Visit (HOSPITAL_COMMUNITY): Payer: Self-pay | Admitting: Psychiatry

## 2017-10-12 DIAGNOSIS — F418 Other specified anxiety disorders: Secondary | ICD-10-CM

## 2017-10-12 MED ORDER — SERTRALINE HCL 100 MG PO TABS: 150.0000 mg | ORAL_TABLET | Freq: Every day | ORAL | 0 refills | Status: DC

## 2017-11-29 ENCOUNTER — Other Ambulatory Visit (HOSPITAL_COMMUNITY): Payer: Self-pay | Admitting: Psychiatry

## 2017-11-29 DIAGNOSIS — F418 Other specified anxiety disorders: Secondary | ICD-10-CM

## 2017-12-07 ENCOUNTER — Telehealth (HOSPITAL_COMMUNITY): Payer: Self-pay

## 2017-12-07 DIAGNOSIS — F418 Other specified anxiety disorders: Secondary | ICD-10-CM

## 2017-12-07 MED ORDER — SERTRALINE HCL 100 MG PO TABS: 150.0000 mg | ORAL_TABLET | Freq: Every day | ORAL | 0 refills | Status: DC

## 2017-12-07 NOTE — Telephone Encounter (Signed)
Patient called in to make appt for this coming Thursday. She states she is having withdrawals from medication. Sent in a one month supply of sertraline and informed patient to keep appt becaise there will not be another refill until she is seen. Patient stated her understanding.

## 2017-12-09 ENCOUNTER — Encounter (HOSPITAL_COMMUNITY): Payer: Self-pay | Admitting: Psychiatry

## 2017-12-09 ENCOUNTER — Ambulatory Visit (INDEPENDENT_AMBULATORY_CARE_PROVIDER_SITE_OTHER): Payer: BLUE CROSS/BLUE SHIELD | Admitting: Psychiatry

## 2017-12-09 VITALS — BP 120/74 | HR 91 | Ht 64.0 in | Wt 191.0 lb

## 2017-12-09 DIAGNOSIS — F418 Other specified anxiety disorders: Secondary | ICD-10-CM | POA: Diagnosis not present

## 2017-12-09 DIAGNOSIS — F411 Generalized anxiety disorder: Secondary | ICD-10-CM | POA: Diagnosis not present

## 2017-12-09 DIAGNOSIS — F331 Major depressive disorder, recurrent, moderate: Secondary | ICD-10-CM

## 2017-12-09 DIAGNOSIS — Z87891 Personal history of nicotine dependence: Secondary | ICD-10-CM

## 2017-12-09 MED ORDER — ESCITALOPRAM OXALATE 10 MG PO TABS: 10.0000 mg | ORAL_TABLET | Freq: Every day | ORAL | 1 refills | Status: DC
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
## 2018-01-01 ENCOUNTER — Other Ambulatory Visit (HOSPITAL_COMMUNITY): Payer: Self-pay | Admitting: Psychiatry

## 2018-01-01 DIAGNOSIS — F418 Other specified anxiety disorders: Secondary | ICD-10-CM

## 2018-01-12 ENCOUNTER — Ambulatory Visit (HOSPITAL_COMMUNITY): Payer: BLUE CROSS/BLUE SHIELD | Admitting: Psychiatry

## 2018-03-02 ENCOUNTER — Other Ambulatory Visit (HOSPITAL_COMMUNITY): Payer: Self-pay | Admitting: Psychiatry

## 2018-03-02 DIAGNOSIS — F418 Other specified anxiety disorders: Secondary | ICD-10-CM

## 2018-03-22 ENCOUNTER — Ambulatory Visit (HOSPITAL_COMMUNITY): Payer: BLUE CROSS/BLUE SHIELD | Admitting: Psychiatry

## 2018-03-23 ENCOUNTER — Ambulatory Visit (HOSPITAL_COMMUNITY): Payer: BLUE CROSS/BLUE SHIELD | Admitting: Psychiatry

## 2018-04-04 ENCOUNTER — Other Ambulatory Visit: Payer: Self-pay | Admitting: Obstetrics & Gynecology

## 2018-04-04 DIAGNOSIS — N6489 Other specified disorders of breast: Secondary | ICD-10-CM

## 2018-04-04 DIAGNOSIS — N6320 Unspecified lump in the left breast, unspecified quadrant: Secondary | ICD-10-CM

## 2018-04-28 ENCOUNTER — Other Ambulatory Visit (HOSPITAL_COMMUNITY): Payer: Self-pay | Admitting: Psychiatry

## 2018-04-28 DIAGNOSIS — F418 Other specified anxiety disorders: Secondary | ICD-10-CM

## 2018-07-25 ENCOUNTER — Encounter (HOSPITAL_COMMUNITY): Payer: Self-pay | Admitting: Psychiatry

## 2018-07-25 ENCOUNTER — Ambulatory Visit (INDEPENDENT_AMBULATORY_CARE_PROVIDER_SITE_OTHER): Payer: BLUE CROSS/BLUE SHIELD | Admitting: Psychiatry

## 2018-07-25 DIAGNOSIS — F418 Other specified anxiety disorders: Secondary | ICD-10-CM | POA: Diagnosis not present

## 2018-07-25 DIAGNOSIS — F411 Generalized anxiety disorder: Secondary | ICD-10-CM | POA: Diagnosis not present

## 2018-07-25 DIAGNOSIS — F331 Major depressive disorder, recurrent, moderate: Secondary | ICD-10-CM | POA: Diagnosis not present

## 2018-07-25 MED ORDER — SERTRALINE HCL 100 MG PO TABS: 100.0000 mg | ORAL_TABLET | Freq: Every day | ORAL | 3 refills | Status: DC

## 2018-07-25 NOTE — Progress Notes (Signed)
[facility] Outpatient Follow up visit  Patient Identification: Stephanie Hopkins MRN:  [id number] Date of Evaluation:  07/25/2018 Referral Source: Dr. Hulan Fray, OBGYN Chief Complaint:    medication follow up for depression.  I connected with Stephanie Hopkins on 07/25/18 at  8:45 AM EDT by telephone and verified that I am speaking with the correct person using two identifiers.   I discussed the limitations, risks, security and privacy concerns of performing an evaluation and management service by telephone and the availability of in person appointments. I also discussed with the patient that there may be a patient responsible charge related to this service. The patient expressed understanding and agreed to proceed. Visit Diagnosis:    ICD-10-CM   1. Depression with anxiety F41.8 sertraline (ZOLOFT) 100 MG tablet  2. Major depressive disorder, recurrent episode, moderate (HCC) F33.1   3. GAD (generalized anxiety disorder) F41.1     History of Present Illness:  33 years old married white female. Referred initially for management of depression and anxiety.  Difficult childhood in the past  Has history of postpartum depression 2-3  years ago zoloft was changed to lexapro but she didn't take and went back to zoloft, feels fair on 100mg  Husband works, has 2 kids, was not able to make fu appointments   No significant panic attacks  Aggravating F: dysfunctional childhood and home schooling.  Modifying F: kids ages 63 and 35. Supportive husband but he works shifts  6/10  Wants to keep thing sorganized and gets overwhelmed Weight fluctuations    Past Psychiatric History: depression  Previous Psychotropic Medications: No   Substance Abuse History in the last 12 months:  No.  Consequences of Substance Abuse: NA  Past Medical History:  Past Medical History:  Diagnosis Date  . Anxiety   . Colitis   . GERD (gastroesophageal reflux disease)    Zantac    . Gestational diabetes   . History of  gestational diabetes 09/25/2014   Diet Controlled; 76%ile at 38 wks   . History of postpartum depression 07/06/2014  . Migraines   . Supervision of normal pregnancy 09/14/2011   [facility] (transfer from [place]) Genetic Screen  Reports negative  Anatomic [place]  Normal @ 20 wks; poor visual of spine>rescan nml  Glucose Screen 150 >> needs 3 hr GTT  GBS   Feeding Preference  Breast  Contraception   Circumcision         Past Surgical History:  Procedure Laterality Date  . CESAREAN SECTION  01/30/2012   Procedure: CESAREAN SECTION;  Surgeon: Jonnie Kind, MD;  Location: [facility];  Service: Gynecology;  Laterality: N/A;  Primary Cesarean Section Delivery Baby Boy @ [phone], Apgars 4/9   . CESAREAN SECTION N/A 12/04/2014   Procedure: CESAREAN SECTION;  Surgeon: Lavonia Drafts, MD;  Location: [facility];  Service: Obstetrics;  Laterality: N/A;  Requested 12/03/14 @4 :00p  . MANDIBLE SURGERY  33 yo   Missing bone  . WISDOM TOOTH EXTRACTION  2017    Family Psychiatric History: mom : depression. Opiate use  brother: depression   Family History:  Family History  Problem Relation Age of Onset  . Hypertension Mother   . Anxiety disorder Mother   . Anxiety disorder Father   . Depression Brother   . Anxiety disorder Brother     Social History:   Social History   Socioeconomic History  . Marital status: Married    Spouse name: Not on file  . Number of  children: Not on file  . Years of education: Not on file  . Highest education level: Not on file  Occupational History    Comment: Homemaker  Social Needs  . Financial resource strain: Not on file  . Food insecurity:    Worry: Not on file    Inability: Not on file  . Transportation needs:    Medical: Not on file    Non-medical: Not on file  Tobacco Use  . Smoking status: Former Research scientist (life sciences)  . Smokeless tobacco: Never Used  Substance and Sexual Activity  . Alcohol use: No  . Drug use: No  . Sexual  activity: Yes    Birth control/protection: None, Condom  Lifestyle  . Physical activity:    Days per week: 1 day    Minutes per session: 20 min  . Stress: Rather much  Relationships  . Social connections:    Talks on phone: More than three times a week    Gets together: Once a week    Attends religious service: More than 4 times per year    Active member of club or organization: No    Attends meetings of clubs or organizations: Never    Relationship status: Married  Other Topics Concern  . Not on file  Social History Narrative  . Not on file    Allergies:   Allergies  Allergen Reactions  . Sulfa Antibiotics Rash  . Adhesive [Tape] Rash  . Bactrim [Sulfamethoxazole-Trimethoprim] Rash    Metabolic Disorder Labs: No results found for: HGBA1C, MPG No results found for: PROLACTIN No results found for: CHOL, TRIG, HDL, CHOLHDL, VLDL, LDLCALC   Current Medications: Current Outpatient Medications  Medication Sig Dispense Refill  . acetaminophen (TYLENOL) 500 MG tablet Take 1,000 mg by mouth every 6 (six) hours as needed for mild pain or headache.     . cetirizine (ZYRTEC) 10 MG tablet Take 10 mg by mouth daily.    . diphenhydrAMINE (BENADRYL) 25 mg capsule Take 1 capsule (25 mg total) by mouth every 6 (six) hours as needed. (Patient not taking: Reported on 12/09/2017) 30 capsule 0  . escitalopram (LEXAPRO) 10 MG tablet Take 1 tablet (10 mg total) by mouth daily. 45 tablet 1  . esomeprazole (NEXIUM) 40 MG capsule Take 40 mg by mouth.    . fluticasone (FLONASE) 50 MCG/ACT nasal spray Place into the nose.    Marland Kitchen GNP EVENING PRIMROSE OIL 500 MG CAPS Take by mouth.    . lansoprazole (PREVACID) 30 MG capsule TK 1 C PO QD IN THE MORNING    . metoCLOPramide (REGLAN) 5 MG tablet Take 5 mg by mouth.    . Multiple Vitamin (MULTIVITAMIN) tablet Take by mouth.    . naproxen (NAPROSYN) 500 MG tablet Take 1 tablet (500 mg total) by mouth 2 (two) times daily. (Patient not taking: Reported on  08/05/2017) 30 tablet 0  . Norethindrone Acetate-Ethinyl Estrad-FE (LOESTRIN 24 FE) 1-20 MG-MCG(24) tablet Take 1 tablet by mouth daily. (Patient not taking: Reported on 07/20/2017) 1 Package 11  . ondansetron (ZOFRAN ODT) 4 MG disintegrating tablet Take 1 tablet (4 mg total) by mouth every 8 (eight) hours as needed for nausea. (Patient not taking: Reported on 08/05/2017) 6 tablet 0  . sertraline (ZOLOFT) 100 MG tablet Take 1 tablet (100 mg total) by mouth daily. 30 tablet 3  . sucralfate (CARAFATE) 1 g tablet Take by mouth.    . vitamin E (VITAMIN E) 400 UNIT capsule Take 400 Units by mouth daily.  No current facility-administered medications for this visit.       Psychiatric Specialty Exam: Review of Systems  Cardiovascular: Negative for chest pain.  Neurological: Negative for tremors.    unknown if currently breastfeeding.There is no height or weight on file to calculate BMI.  General Appearance:   Eye Contact:    Speech:  Slow  Volume:  Decreased  Mood: fair  Affect:  Congruent  Thought Process:  Goal Directed  Orientation:  Full (Time, Place, and Person)  Thought Content:  Rumination  Suicidal Thoughts:  No  Homicidal Thoughts:  No  Memory:  Immediate;   Fair Recent;   Fair  Judgement:  Fair  Insight:  Fair  Psychomotor Activity:  Decreased  Concentration:  Concentration: Fair and Attention Span: Fair  Recall:  [employer] of Knowledge:Fair  Language: Fair  Akathisia:  No  Handed:  Right  AIMS (if indicated):    Assets:  Desire for Improvement  ADL's:  Intact  Cognition: WNL  Sleep:  variable    Treatment Plan Summary: Medication management and Plan as follows  1. Major depression recurrent moderate: doing fair. Wants to continue zoloft, will send 2. GAD: fluctuates. Continue zoloft  Avoid CBD oil  I discussed the assessment and treatment plan with the patient. The patient was provided an opportunity to ask questions and all were answered. The patient agreed  with the plan and demonstrated an understanding of the instructions.   The patient was advised to call back or seek an in-person evaluation if the symptoms worsen or if the condition fails to improve as anticipated.  I provided 15 minutes of non-face-to-face time during this encounter. Fu 64m.    Merian Capron, MD 5/11/20208:49 AM

## 2018-10-25 ENCOUNTER — Other Ambulatory Visit: Payer: Self-pay

## 2018-10-25 ENCOUNTER — Encounter (HOSPITAL_COMMUNITY): Payer: Self-pay | Admitting: Psychiatry

## 2018-10-25 ENCOUNTER — Ambulatory Visit (INDEPENDENT_AMBULATORY_CARE_PROVIDER_SITE_OTHER): Payer: BLUE CROSS/BLUE SHIELD | Admitting: Psychiatry

## 2018-10-25 DIAGNOSIS — F411 Generalized anxiety disorder: Secondary | ICD-10-CM | POA: Diagnosis not present

## 2018-10-25 DIAGNOSIS — F418 Other specified anxiety disorders: Secondary | ICD-10-CM | POA: Diagnosis not present

## 2018-10-25 DIAGNOSIS — F331 Major depressive disorder, recurrent, moderate: Secondary | ICD-10-CM | POA: Diagnosis not present

## 2018-10-25 MED ORDER — SERTRALINE HCL 100 MG PO TABS: 100.0000 mg | ORAL_TABLET | Freq: Every day | ORAL | 3 refills | Status: DC

## 2018-10-25 NOTE — Progress Notes (Signed)
[facility] Outpatient Follow up visit  Patient Identification: Stephanie Hopkins MRN:  [id number] Date of Evaluation:  10/25/2018 Referral Source: Dr. Hulan Fray, OBGYN Chief Complaint:    medication follow up for depression.  Visit Diagnosis:    ICD-10-CM   1. Depression with anxiety  F41.8 sertraline (ZOLOFT) 100 MG tablet  2. Major depressive disorder, recurrent episode, moderate (HCC)  F33.1   3. GAD (generalized anxiety disorder)  F41.1    Virtual Visit via Telephone Note  I connected with Stephanie Hopkins on 10/25/18 at 10:30 AM EDT by telephone and verified that I am speaking with the correct person using two identifiers.   I discussed the limitations, risks, security and privacy concerns of performing an evaluation and management service by telephone and the availability of in person appointments. I also discussed with the patient that there may be a patient responsible charge related to this service. The patient expressed understanding and agreed to proceed.   I discussed the assessment and treatment plan with the patient. The patient was provided an opportunity to ask questions and all were answered. The patient agreed with the plan and demonstrated an understanding of the instructions.   The patient was advised to call back or seek an in-person evaluation if the symptoms worsen or if the condition fails to improve as anticipated. History of Present Illness:  33 years old married white female. Referred initially for management of depression and anxiety.  Difficult childhood in the past  Doing fair on zoloft Less anxious, husband is supportive  Has history of postpartum depression 2-3  years ago zoloft was changed to lexapro but she didn't take and went back to zoloft, feels fair on 100mg  Husband works, has 2 kids. No significant panic attacks  Aggravating F: dysfunctional childhood and home schooling.  Modifying F: kids ages 83 and 41. Supportive husband but he works shifts     Past  Psychiatric History: depression  Previous Psychotropic Medications: No   Substance Abuse History in the last 12 months:  No.  Consequences of Substance Abuse: NA  Past Medical History:  Past Medical History:  Diagnosis Date  . Anxiety   . Colitis   . GERD (gastroesophageal reflux disease)    Zantac    . Gestational diabetes   . History of gestational diabetes 09/25/2014   Diet Controlled; 76%ile at 38 wks   . History of postpartum depression 07/06/2014  . Migraines   . Supervision of normal pregnancy 09/14/2011   [facility] (transfer from [place]) Genetic Screen  Reports negative  Anatomic [place]  Normal @ 20 wks; poor visual of spine>rescan nml  Glucose Screen 150 >> needs 3 hr GTT  GBS   Feeding Preference  Breast  Contraception   Circumcision         Past Surgical History:  Procedure Laterality Date  . CESAREAN SECTION  01/30/2012   Procedure: CESAREAN SECTION;  Surgeon: Jonnie Kind, MD;  Location: [facility];  Service: Gynecology;  Laterality: N/A;  Primary Cesarean Section Delivery Baby Boy @ [phone], Apgars 4/9   . CESAREAN SECTION N/A 12/04/2014   Procedure: CESAREAN SECTION;  Surgeon: Lavonia Drafts, MD;  Location: [facility];  Service: Obstetrics;  Laterality: N/A;  Requested 12/03/14 @4 :00p  . MANDIBLE SURGERY  33 yo   Missing bone  . WISDOM TOOTH EXTRACTION  2017    Family Psychiatric History: mom : depression. Opiate use  brother: depression   Family History:  Family History  Problem Relation  Age of Onset  . Hypertension Mother   . Anxiety disorder Mother   . Anxiety disorder Father   . Depression Brother   . Anxiety disorder Brother     Social History:   Social History   Socioeconomic History  . Marital status: Married    Spouse name: Not on file  . Number of children: Not on file  . Years of education: Not on file  . Highest education level: Not on file  Occupational History    Comment: Homemaker  Social Needs  .  Financial resource strain: Not on file  . Food insecurity    Worry: Not on file    Inability: Not on file  . Transportation needs    Medical: Not on file    Non-medical: Not on file  Tobacco Use  . Smoking status: Former Research scientist (life sciences)  . Smokeless tobacco: Never Used  Substance and Sexual Activity  . Alcohol use: No  . Drug use: No  . Sexual activity: Yes    Birth control/protection: None, Condom  Lifestyle  . Physical activity    Days per week: 1 day    Minutes per session: 20 min  . Stress: Rather much  Relationships  . Social connections    Talks on phone: More than three times a week    Gets together: Once a week    Attends religious service: More than 4 times per year    Active member of club or organization: No    Attends meetings of clubs or organizations: Never    Relationship status: Married  Other Topics Concern  . Not on file  Social History Narrative  . Not on file    Allergies:   Allergies  Allergen Reactions  . Sulfa Antibiotics Rash  . Adhesive [Tape] Rash  . Bactrim [Sulfamethoxazole-Trimethoprim] Rash    Metabolic Disorder Labs: No results found for: HGBA1C, MPG No results found for: PROLACTIN No results found for: CHOL, TRIG, HDL, CHOLHDL, VLDL, LDLCALC   Current Medications: Current Outpatient Medications  Medication Sig Dispense Refill  . acetaminophen (TYLENOL) 500 MG tablet Take 1,000 mg by mouth every 6 (six) hours as needed for mild pain or headache.     . cetirizine (ZYRTEC) 10 MG tablet Take 10 mg by mouth daily.    . diphenhydrAMINE (BENADRYL) 25 mg capsule Take 1 capsule (25 mg total) by mouth every 6 (six) hours as needed. (Patient not taking: Reported on 12/09/2017) 30 capsule 0  . esomeprazole (NEXIUM) 40 MG capsule Take 40 mg by mouth.    . fluticasone (FLONASE) 50 MCG/ACT nasal spray Place into the nose.    Marland Kitchen GNP EVENING PRIMROSE OIL 500 MG CAPS Take by mouth.    . lansoprazole (PREVACID) 30 MG capsule TK 1 C PO QD IN THE MORNING    .  metoCLOPramide (REGLAN) 5 MG tablet Take 5 mg by mouth.    . Multiple Vitamin (MULTIVITAMIN) tablet Take by mouth.    . naproxen (NAPROSYN) 500 MG tablet Take 1 tablet (500 mg total) by mouth 2 (two) times daily. (Patient not taking: Reported on 08/05/2017) 30 tablet 0  . Norethindrone Acetate-Ethinyl Estrad-FE (LOESTRIN 24 FE) 1-20 MG-MCG(24) tablet Take 1 tablet by mouth daily. (Patient not taking: Reported on 07/20/2017) 1 Package 11  . ondansetron (ZOFRAN ODT) 4 MG disintegrating tablet Take 1 tablet (4 mg total) by mouth every 8 (eight) hours as needed for nausea. (Patient not taking: Reported on 08/05/2017) 6 tablet 0  . sertraline (ZOLOFT)  100 MG tablet Take 1 tablet (100 mg total) by mouth daily. 30 tablet 3  . sucralfate (CARAFATE) 1 g tablet Take by mouth.    . vitamin E (VITAMIN E) 400 UNIT capsule Take 400 Units by mouth daily.     No current facility-administered medications for this visit.       Psychiatric Specialty Exam: Review of Systems  Cardiovascular: Negative for chest pain.  Neurological: Negative for tremors.    unknown if currently breastfeeding.There is no height or weight on file to calculate BMI.  General Appearance:   Eye Contact:    Speech:  Slow  Volume:  Decreased  Mood: fair  Affect:  Congruent  Thought Process:  Goal Directed  Orientation:  Full (Time, Place, and Person)  Thought Content:  Rumination  Suicidal Thoughts:  No  Homicidal Thoughts:  No  Memory:  Immediate;   Fair Recent;   Fair  Judgement:  Fair  Insight:  Fair  Psychomotor Activity:  Decreased  Concentration:  Concentration: Fair and Attention Span: Fair  Recall:  [employer] of Knowledge:Fair  Language: Fair  Akathisia:  No  Handed:  Right  AIMS (if indicated):    Assets:  Desire for Improvement  ADL's:  Intact  Cognition: WNL  Sleep:  variable    Treatment Plan Summary: Medication management and Plan as follows  1. Major depression recurrent moderate: doing fair, continue  zoloft refills sent 2. GAD: fluctuates. Continue zoloft  Avoid CBD oil or any substance of abuse Fu 4-10m.    Merian Capron, MD 8/11/202010:38 AM

## 2019-03-01 ENCOUNTER — Ambulatory Visit (HOSPITAL_COMMUNITY): Payer: BLUE CROSS/BLUE SHIELD | Admitting: Psychiatry

## 2019-08-21 ENCOUNTER — Other Ambulatory Visit: Payer: Self-pay

## 2019-08-21 ENCOUNTER — Ambulatory Visit: Payer: Self-pay | Admitting: Obstetrics & Gynecology

## 2019-08-21 ENCOUNTER — Encounter: Payer: Self-pay | Admitting: Obstetrics & Gynecology

## 2019-08-21 VITALS — BP 133/92 | HR 88 | Resp 16 | Ht 64.0 in | Wt 213.0 lb

## 2019-08-21 DIAGNOSIS — M546 Pain in thoracic spine: Secondary | ICD-10-CM

## 2019-08-21 DIAGNOSIS — Z3202 Encounter for pregnancy test, result negative: Secondary | ICD-10-CM

## 2019-08-21 DIAGNOSIS — N926 Irregular menstruation, unspecified: Secondary | ICD-10-CM

## 2019-08-21 DIAGNOSIS — R1032 Left lower quadrant pain: Secondary | ICD-10-CM

## 2019-08-21 DIAGNOSIS — R1010 Upper abdominal pain, unspecified: Secondary | ICD-10-CM

## 2019-08-21 LAB — POCT URINE PREGNANCY: Preg Test, Ur: NEGATIVE

## 2019-08-21 MED ORDER — MEDROXYPROGESTERONE ACETATE 10 MG PO TABS: 10.0000 mg | ORAL_TABLET | Freq: Every day | ORAL | 0 refills | Status: DC

## 2019-08-21 NOTE — Progress Notes (Signed)
   Subjective:    Patient ID: Stephanie Hopkins, female    DOB: [birth year], 34 y.o.   MRN: [id number]  HPI  34 yo female persents for hormaonl issuea abled Started bleeding again on June 1st-not heavy.  Was associated with pain.  Menses May 18th for 8 days--5 days bleeding and 3 days spotting.    Patient having pain in left Lower Quadrant and left back pain.   Pt having upper abdominal pain.  She had EGD recently.  Back pain worse duing period.  Pt denies issues with bowel and bladder.  Pt is sexually active.  UPT negative today. Pt also c/o increase emotional lability before menses c/w PMDD (We had discussed this 2 years ago and she saw [facility] MD for this as well).  Pt has psychiatrist who manages her medication.    Review of Systems  Constitutional: Negative.   Respiratory: Negative.   Cardiovascular: Negative.   Gastrointestinal: Positive for abdominal pain.  Genitourinary: Positive for menstrual problem, pelvic pain and vaginal pain.       Objective:   Physical Exam Vitals reviewed.  Constitutional:      General: She is not in acute distress.    Appearance: She is well-developed.  HENT:     Head: Normocephalic and atraumatic.  Eyes:     Conjunctiva/sclera: Conjunctivae normal.  Cardiovascular:     Rate and Rhythm: Normal rate.  Pulmonary:     Effort: Pulmonary effort is normal.  Abdominal:     General: Bowel sounds are normal. There is no distension.     Palpations: Abdomen is soft. There is no mass.     Tenderness: There is no abdominal tenderness. There is no right CVA tenderness, left CVA tenderness, guarding or rebound.  Genitourinary:    Comments: Tanner V Vulva:  No lesion Vagina:  Pink, no lesions, no discharge, no blood Cervix:  No CMT Uterus:  Non tender, mobile, limited by habitus Right adnexa--non tender, limited by habitus Left adnexa--mildly tender with bimanual exam, limited by habitus.  Musculoskeletal:        General: Tenderness present.     Comments: Pain on  left side of back medial to kidney  Skin:    General: Skin is warm and dry.  Neurological:     Mental Status: She is alert and oriented to person, place, and time.  Psychiatric:        Mood and Affect: Mood normal.        Behavior: Behavior normal.    Vitals:   08/21/19 1013  BP: (!) 133/92  Pulse: 88  Resp: 16  Weight: 213 lb (96.6 kg)  Height: 5\' 4"  (1.626 m)       Assessment & Plan:  34 yo female presents for several concerns  1.  PMDD symptoms.  Patient encouraged to make appt with psychiatrist to discuss meds.  Option could be to increase SSRI the two weeks before her menses. 2.  Abdominal, pelvic, and back pain--we discussed CT vs [place] and what each modality is useful for.  She would like to proceed to CT scan and will get F/U US as needed.   3.  Bleeding--I suggest a 14 day course of provera to help with bleeding.  Pt agrees to trial.    35 minutes spent with patient, entering orders, and charting.

## 2019-08-22 ENCOUNTER — Ambulatory Visit (INDEPENDENT_AMBULATORY_CARE_PROVIDER_SITE_OTHER): Payer: Self-pay

## 2019-08-22 DIAGNOSIS — R1010 Upper abdominal pain, unspecified: Secondary | ICD-10-CM

## 2019-08-22 DIAGNOSIS — R1032 Left lower quadrant pain: Secondary | ICD-10-CM

## 2019-08-22 MED ORDER — IOHEXOL 300 MG/ML  SOLN
100.0000 mL | Freq: Once | INTRAMUSCULAR | Status: AC | PRN
  Administered 2019-08-22: 100 mL via INTRAVENOUS

## 2020-03-20 ENCOUNTER — Other Ambulatory Visit (HOSPITAL_COMMUNITY): Payer: Self-pay | Admitting: Psychiatry

## 2020-03-20 DIAGNOSIS — F418 Other specified anxiety disorders: Secondary | ICD-10-CM

## 2020-03-28 ENCOUNTER — Ambulatory Visit: Payer: Self-pay | Admitting: Obstetrics & Gynecology

## 2020-04-04 ENCOUNTER — Ambulatory Visit: Payer: Self-pay | Admitting: Obstetrics and Gynecology

## 2020-04-18 ENCOUNTER — Ambulatory Visit (INDEPENDENT_AMBULATORY_CARE_PROVIDER_SITE_OTHER): Payer: 59 | Admitting: Obstetrics and Gynecology

## 2020-04-18 ENCOUNTER — Encounter: Payer: Self-pay | Admitting: Obstetrics and Gynecology

## 2020-04-18 ENCOUNTER — Other Ambulatory Visit (HOSPITAL_COMMUNITY)
Admission: RE | Admit: 2020-04-18 | Discharge: 2020-04-18 | Disposition: A | Discharge status: Home / Self Care | Payer: 59 | Source: Ambulatory Visit | Attending: Obstetrics and Gynecology | Admitting: Obstetrics and Gynecology

## 2020-04-18 ENCOUNTER — Other Ambulatory Visit: Payer: Self-pay

## 2020-04-18 VITALS — BP 129/94 | HR 118 | Wt 210.0 lb

## 2020-04-18 DIAGNOSIS — N8111 Cystocele, midline: Secondary | ICD-10-CM

## 2020-04-18 DIAGNOSIS — R399 Unspecified symptoms and signs involving the genitourinary system: Secondary | ICD-10-CM | POA: Diagnosis not present

## 2020-04-18 DIAGNOSIS — Z124 Encounter for screening for malignant neoplasm of cervix: Secondary | ICD-10-CM | POA: Diagnosis present

## 2020-04-18 DIAGNOSIS — Z01419 Encounter for gynecological examination (general) (routine) without abnormal findings: Secondary | ICD-10-CM

## 2020-04-18 DIAGNOSIS — R3989 Other symptoms and signs involving the genitourinary system: Secondary | ICD-10-CM

## 2020-04-18 DIAGNOSIS — N841 Polyp of cervix uteri: Secondary | ICD-10-CM

## 2020-04-18 LAB — POCT URINALYSIS DIPSTICK
Bilirubin, UA: NEGATIVE
Blood, UA: NEGATIVE
Glucose, UA: NEGATIVE
Ketones, UA: NEGATIVE
Leukocytes, UA: NEGATIVE
Nitrite, UA: NEGATIVE
Protein, UA: NEGATIVE
Spec Grav, UA: <=1.005 — AB (ref 1.010–1.025)
Urobilinogen, UA: 0.2 E.U./dL
pH, UA: 6.5 (ref 5.0–8.0)

## 2020-04-18 NOTE — Progress Notes (Signed)
GYNECOLOGY ANNUAL PREVENTATIVE CARE ENCOUNTER NOTE  Subjective:   Stephanie Hopkins is a 35 y.o. [phone] female here for a annual gynecologic exam. Current complaints: concerned about urinary issues and possible prolapse. Has urinary frequency, has always had this but worse lately. Also feels a lot of pressure. This is worse within last 3 months. Pain associated with wiping after urinating for about a year. Looked at genitals with mirror and is concerned she has a prolapse. Has fullness/pressure in vaginal area.  Denies abnormal vaginal bleeding, discharge, pelvic pain, problems with intercourse or other gynecologic concerns. Declines STI screen.   Gynecologic History Patient's last menstrual period was 03/31/2020. Contraception: condoms Last Pap: 07/2017. Results: normal Last mammogram: n/a  Obstetric History OB History  Gravida Para Term Preterm AB Living  4 2 2   2 2   SAB IAB Ectopic Multiple Live Births  1     0 2    # Outcome Date GA Lbr Len/2nd Weight Sex Delivery Anes PTL Lv  4 Term 12/04/14 [redacted]w[redacted]d  7 lb 8.5 oz (3.415 kg) F CS-LTranv Spinal  LIV  3 SAB 08/14/13        FD  2 Term 01/30/12 [redacted]w[redacted]d 04:19 / 04:36 8 lb 4.5 oz (3.755 kg) M CS-LTranv EPI  LIV     Complications: Gestational diabetes  1 AB 09/03/10 [redacted]w[redacted]d   U    DEC    Obstetric Comments  Gestational Diabetes    Past Medical History:  Diagnosis Date  . Anxiety   . Colitis   . Fibroadenoma of both breasts   . GERD (gastroesophageal reflux disease)    Zantac    . Gestational diabetes   . History of gestational diabetes 09/25/2014   Diet Controlled; 76%ile at 38 wks   . History of postpartum depression 07/06/2014  . Migraines   . PMDD (premenstrual dysphoric disorder)   . Supervision of normal pregnancy 09/14/2011   [facility] (transfer from [place]) Genetic Screen  Reports negative  Anatomic [place]  Normal @ 20 wks; poor visual of spine>rescan nml  Glucose Screen 150 >> needs 3 hr  GTT  GBS   Feeding Preference  Breast  Contraception   Circumcision         Past Surgical History:  Procedure Laterality Date  . CESAREAN SECTION  01/30/2012   Procedure: CESAREAN SECTION;  Surgeon: Jonnie Kind, MD;  Location: [facility];  Service: Gynecology;  Laterality: N/A;  Primary Cesarean Section Delivery Baby Boy @ [phone], Apgars 4/9   . CESAREAN SECTION N/A 12/04/2014   Procedure: CESAREAN SECTION;  Surgeon: Lavonia Drafts, MD;  Location: [facility];  Service: Obstetrics;  Laterality: N/A;  Requested 12/03/14 @4 :00p  . MANDIBLE SURGERY  35 yo   Missing bone  . SEPTOPLASTY    . WISDOM TOOTH EXTRACTION  2017    Current Outpatient Medications on File Prior to Visit  Medication Sig Dispense Refill  . lansoprazole (PREVACID) 30 MG capsule TK 1 C PO QD IN THE MORNING    . sertraline (ZOLOFT) 100 MG tablet TAKE 1 TABLET(100 MG) BY MOUTH DAILY 30 tablet 1  . acetaminophen (TYLENOL) 500 MG tablet Take 1,000 mg by mouth every 6 (six) hours as needed for mild pain or headache.  (Patient not taking: Reported on 04/18/2020)    . cetirizine (ZYRTEC) 10 MG tablet Take 10 mg by mouth daily. (Patient not taking: Reported on 04/18/2020)    . Multiple Vitamin (MULTIVITAMIN) tablet Take  by mouth. (Patient not taking: Reported on 04/18/2020)    . ondansetron (ZOFRAN ODT) 4 MG disintegrating tablet Take 1 tablet (4 mg total) by mouth every 8 (eight) hours as needed for nausea. (Patient not taking: No sig reported) 6 tablet 0  . [DISCONTINUED] amitriptyline (ELAVIL) 10 MG tablet TK 2 1/2 T PO QHS  11  . [DISCONTINUED] escitalopram (LEXAPRO) 10 MG tablet Take 1 tablet (10 mg total) by mouth daily. 45 tablet 1   No current facility-administered medications on file prior to visit.    Allergies  Allergen Reactions  . Hydrocodone-Acetaminophen Other (See Comments)  . Sulfa Antibiotics Rash  . Adhesive [Tape] Rash  . Bactrim [Sulfamethoxazole-Trimethoprim] Rash    Social History   Socioeconomic History   . Marital status: Married    Spouse name: Not on file  . Number of children: Not on file  . Years of education: Not on file  . Highest education level: Not on file  Occupational History    Comment: Homemaker  Tobacco Use  . Smoking status: Former Research scientist (life sciences)  . Smokeless tobacco: Never Used  Vaping Use  . Vaping Use: Never used  Substance and Sexual Activity  . Alcohol use: No  . Drug use: No  . Sexual activity: Yes    Birth control/protection: None, Condom  Other Topics Concern  . Not on file  Social History Narrative  . Not on file   Social Determinants of Health   Financial Resource Strain: Not on file  Food Insecurity: Not on file  Transportation Needs: Not on file  Physical Activity: Not on file  Stress: Not on file  Social Connections: Not on file  Intimate Partner Violence: Not on file    Family History  Problem Relation Age of Onset  . Hypertension Mother   . Anxiety disorder Mother   . Anxiety disorder Father   . Depression Brother   . Anxiety disorder Brother     The following portions of the patient's history were reviewed and updated as appropriate: allergies, current medications, past family history, past medical history, past social history, past surgical history and problem list.  Review of Systems Pertinent items are noted in HPI.   Objective:  BP (!) 129/94   Pulse (!) 118   Wt 210 lb (95.3 kg)   LMP 03/31/2020   BMI 36.05 kg/m  CONSTITUTIONAL: Well-developed, well-nourished female in no acute distress.  HENT:  Normocephalic, atraumatic, External right and left ear normal. Oropharynx is clear and moist EYES: Conjunctivae and EOM are normal. Pupils are equal, round, and reactive to light. No scleral icterus.  NECK: Normal range of motion, supple, no masses.  Normal thyroid.  SKIN: Skin is warm and dry. No rash noted. Not diaphoretic. No erythema. No pallor. NEUROLOGIC: Alert and oriented to person, place, and time. Normal reflexes, muscle tone  coordination. No cranial nerve deficit noted. PSYCHIATRIC: Normal mood and affect. Normal behavior. Normal judgment and thought content. CARDIOVASCULAR: Normal heart rate noted RESPIRATORY: Effort  normal, no problems with respiration noted. BREASTS: Symmetric in size. No masses, skin changes, nipple drainage, or lymphadenopathy. ABDOMEN: Soft, no distention noted.  No tenderness, rebound or guarding.  PELVIC: Normal appearing external genitalia; normal appearing vaginal mucosa and cervix.  No abnormal discharge noted.  Pap smear obtained. 2 mm cervical polyp with thin stalk at os. Pelvic cultures obtained. Normal uterine size, no other palpable masses, no uterine or adnexal tenderness. Mild cystocele MUSCULOSKELETAL: Normal range of motion. No tenderness.  No cyanosis,  clubbing, or edema.  2+ distal pulses.  Exam done with chaperone present.  Assessment and Plan:   1. UTI symptoms - POCT Urinalysis Dipstick - Urine Culture  2. Well woman exam  3. Cervical cancer screening - Cytology - PAP( [facility])  4. Cystocele, midline Possibly causing symptoms - pt interested in referral to Urogyn to discuss options although not currently interested in management - Ambulatory referral to Urogynecology  5. Urethral pain No apparent cause for pain, use soft toilet tissue and blot rather than wipe - Ambulatory referral to Urogynecology  6. Cervical polyp Return for office removal    Will follow up results of pap smear and manage accordingly. Encouraged improvement in diet and exercise.  Declines STI screen. COVID vaccine , has had first Mammogram n/a Referral for colonoscopy n/a Flu vaccine declined   Routine preventative health maintenance measures emphasized. Please refer to After Visit Summary for other counseling recommendations.     Feliz Beam, MD, [place] for [facility])

## 2020-04-20 LAB — URINE CULTURE
MICRO NUMBER:: 11495442
Result:: NO GROWTH
SPECIMEN QUALITY:: ADEQUATE

## 2020-04-22 LAB — CYTOLOGY - PAP
Comment: NEGATIVE
Diagnosis: NEGATIVE
High risk HPV: NEGATIVE

## 2020-05-16 ENCOUNTER — Encounter: Payer: Self-pay | Admitting: Obstetrics and Gynecology

## 2020-05-16 ENCOUNTER — Other Ambulatory Visit: Payer: Self-pay

## 2020-05-16 ENCOUNTER — Other Ambulatory Visit: Payer: Self-pay | Admitting: Obstetrics and Gynecology

## 2020-05-16 ENCOUNTER — Ambulatory Visit (INDEPENDENT_AMBULATORY_CARE_PROVIDER_SITE_OTHER): Payer: 59 | Admitting: Obstetrics and Gynecology

## 2020-05-16 VITALS — BP 130/78 | HR 77 | Ht 64.0 in | Wt 208.0 lb

## 2020-05-16 DIAGNOSIS — R102 Pelvic and perineal pain: Secondary | ICD-10-CM | POA: Diagnosis not present

## 2020-05-16 DIAGNOSIS — Z1231 Encounter for screening mammogram for malignant neoplasm of breast: Secondary | ICD-10-CM | POA: Diagnosis not present

## 2020-05-16 DIAGNOSIS — N841 Polyp of cervix uteri: Secondary | ICD-10-CM

## 2020-05-16 NOTE — Progress Notes (Signed)
GYNECOLOGY OFFICE FOLLOW UP NOTE  History:  35 y.o. [id number] here today for follow up for removal of cervical polyp.    Also reports pelvic pain. Has intermittent pelvic pain, has been ongoing since 07/2019. Left sided pelvic pain, veyr low in pelvis. Not related to periods or bowel movements as far as she can tell. Also seeing GI for this issue. Colicky, intermittent and not predictable. Stabbing in nature. Occasionally feels pain in upper left "butt cheek".   Past Medical History:  Diagnosis Date  . Anxiety   . Colitis   . Fibroadenoma of both breasts   . GERD (gastroesophageal reflux disease)    Zantac    . Gestational diabetes   . History of gestational diabetes 09/25/2014   Diet Controlled; 76%ile at 38 wks   . History of postpartum depression 07/06/2014  . Migraines   . PMDD (premenstrual dysphoric disorder)   . Supervision of normal pregnancy 09/14/2011   [facility] (transfer from [place]) Genetic Screen  Reports negative  Anatomic [place]  Normal @ 20 wks; poor visual of spine>rescan nml  Glucose Screen 150 >> needs 3 hr GTT  GBS   Feeding Preference  Breast  Contraception   Circumcision        Past Surgical History:  Procedure Laterality Date  . CESAREAN SECTION  01/30/2012   Procedure: CESAREAN SECTION;  Surgeon: Jonnie Kind, MD;  Location: [facility];  Service: Gynecology;  Laterality: N/A;  Primary Cesarean Section Delivery Baby Boy @ [phone], Apgars 4/9   . CESAREAN SECTION N/A 12/04/2014   Procedure: CESAREAN SECTION;  Surgeon: Lavonia Drafts, MD;  Location: [facility];  Service: Obstetrics;  Laterality: N/A;  Requested 12/03/14 @4 :00p  . MANDIBLE SURGERY  35 yo   Missing bone  . SEPTOPLASTY    . WISDOM TOOTH EXTRACTION  2017    Current Outpatient Medications:  .  albuterol (VENTOLIN HFA) 108 (90 Base) MCG/ACT inhaler, Inhale into the lungs., Disp: , Rfl:  .  albuterol (VENTOLIN HFA) 108 (90 Base) MCG/ACT inhaler, Inhale into the  lungs., Disp: , Rfl:  .  Baclofen 5 MG TABS, Take 1 tablet by mouth 4 (four) times daily., Disp: , Rfl:  .  cetirizine (ZYRTEC) 10 MG tablet, Take 10 mg by mouth daily., Disp: , Rfl:  .  dexlansoprazole (DEXILANT) 60 MG capsule, Take 1 capsule by mouth daily., Disp: , Rfl:  .  hyoscyamine (LEVSIN SL) 0.125 MG SL tablet, SMARTSIG:2 Sublingual Twice Daily PRN, Disp: , Rfl:  .  Hyoscyamine Sulfate SL 0.125 MG SUBL, , Disp: , Rfl:  .  lansoprazole (PREVACID) 30 MG capsule, Take by mouth., Disp: , Rfl:  .  Multiple Vitamin (MULTIVITAMIN) tablet, Take by mouth., Disp: , Rfl:  .  ondansetron (ZOFRAN) 4 MG tablet, Take by mouth., Disp: , Rfl:  .  sertraline (ZOLOFT) 25 MG tablet, Take 25 mg by mouth daily., Disp: , Rfl:  .  acetaminophen (TYLENOL) 500 MG tablet, Take 1,000 mg by mouth every 6 (six) hours as needed for mild pain or headache.  (Patient not taking: No sig reported), Disp: , Rfl:   The following portions of the patient's history were reviewed and updated as appropriate: allergies, current medications, past family history, past medical history, past social history, past surgical history and problem list.   Review of Systems:  Pertinent items noted in HPI and remainder of comprehensive ROS otherwise negative.   Objective:  Physical Exam BP 130/78  Pulse 77   Ht 5\' 4"  (1.626 m)   Wt 208 lb (94.3 kg)   BMI 35.70 kg/m  CONSTITUTIONAL: Well-developed, well-nourished female in no acute distress.  HENT:  Normocephalic, atraumatic. External right and left ear normal. Oropharynx is clear and moist EYES: Conjunctivae and EOM are normal. Pupils are equal, round, and reactive to light. No scleral icterus.  NECK: Normal range of motion, supple, no masses SKIN: Skin is warm and dry. No rash noted. Not diaphoretic. No erythema. No pallor. NEUROLOGIC: Alert and oriented to person, place, and time. Normal reflexes, muscle tone coordination. No cranial nerve deficit noted. PSYCHIATRIC: Normal mood  and affect. Normal behavior. Normal judgment and thought content. CARDIOVASCULAR: Normal heart rate noted RESPIRATORY: Effort normal, no problems with respiration noted ABDOMEN: Soft, no distention noted.   PELVIC: Normal appearing external genitalia; normal appearing vaginal mucosa and cervix.  2 mm cerviacl polyp again noted, see note for removal. MUSCULOSKELETAL: Normal range of motion. No edema noted.  Exam done with chaperone present.  Labs and Imaging No results found.  Assessment & Plan:  1. Cervical polyp Removed, see note F/u path - Surgical pathology( [facility])  2. Pelvic pain F/u US She is seeing GI Patient to keep diary to track pain - US PELVIC COMPLETE WITH TRANSVAGINAL; Future  3. Encounter for screening mammogram for malignant neoplasm of breast - MM 3D SCREEN BREAST BILATERAL; Future  Routine preventative health maintenance measures emphasized. Please refer to After Visit Summary for other counseling recommendations.   Return in about 2 months (around 07/16/2020) for Followup.  Total face-to-face time with patient: 25 minutes. Over 50% of encounter was spent on counseling and coordination of care.  Feliz Beam, MD, [place] for [facility])

## 2020-05-16 NOTE — Progress Notes (Addendum)
   Cervical polypectomy Note  Stephanie Hopkins [id number]  05/16/2020  The indications for cervical polypectomy were reviewed. Risks of removal including pain, bleeding, infection, inadequate specimen, and need for additional procedures were discussed. The patient stated understanding and agreed to undergo procedure today. Consent was signed, and an adequate time out performed.   The patient's vulva was prepped with Betadine. Tissue forceps and the curved Mayo scissors were used to excise a 7mm cervical polyp.  Small bleeding was noted and hemostasis was achieved Monsel's.    The patient tolerated the procedure well.   Specimens: cervical polyp  Post-procedure instructions were given to the patient. The patient is to call with heavy bleeding, fever greater than 100.4, foul smelling vaginal discharge or other concerns. The patient will be return to clinic in two weeks for discussion of results.   Feliz Beam, M.D. Attending [place], [facility] for [facility], [place]

## 2020-05-21 ENCOUNTER — Other Ambulatory Visit: Payer: Self-pay

## 2020-05-21 ENCOUNTER — Ambulatory Visit (INDEPENDENT_AMBULATORY_CARE_PROVIDER_SITE_OTHER): Payer: 59

## 2020-05-21 DIAGNOSIS — R102 Pelvic and perineal pain: Secondary | ICD-10-CM | POA: Diagnosis not present

## 2020-05-29 ENCOUNTER — Ambulatory Visit: Payer: 59 | Admitting: Obstetrics and Gynecology

## 2020-07-11 ENCOUNTER — Telehealth: Payer: Self-pay | Admitting: *Deleted

## 2020-07-11 NOTE — Progress Notes (Deleted)
[facility] New Patient Evaluation and Consultation  Referring Provider: Sloan Leiter, MD PCP: Christoper Fabian, DO Date of Service: 07/12/2020  SUBJECTIVE Chief Complaint: No chief complaint on file.  History of Present Illness: Stephanie Hopkins is a 35 y.o. {ED SANE IWPY:09983} female seen in consultation at the request of Dr. Rosana Hoes for evaluation of prolapse.    Review of records from Dr Rosana Hoes significant for: Feels something bulging when wiping. Cystocele noted on exam. Also has had urethral pain.   Urinary Symptoms: {urine leakage?:24754} Leaks *** time(s) per {days/wks/mos/yrs:310907}.  Pad use: {NUMBERS 1-10:18281} {pad option:24752} per day.   She {ACTION; IS/IS JAS:50539767} bothered by her UI symptoms.  Day time voids ***.  Nocturia: *** times per night to void. Voiding dysfunction: she {empties:24755} her bladder well.  {DOES NOT does:27190::"does not"} use a catheter to empty bladder.  When urinating, she feels {urine symptoms:24756} Drinks: *** per day  UTIs: {NUMBERS 1-10:18281} UTI's in the last year.   {ACTIONS;DENIES/REPORTS:21021675::"Denies"} history of {urologic concerns:24757}  Pelvic Organ Prolapse Symptoms:                  She {denies/ admits to:24761} a feeling of a bulge the vaginal area. It has been present for {NUMBER 1-10:22536} {days/wks/mos/yrs:310907}.  She {denies/ admits to:24761} seeing a bulge.  This bulge {ACTION; IS/IS HAL:93790240} bothersome.  Bowel Symptom: Bowel movements: *** time(s) per {Time; day/week/month:13537} Stool consistency: {stool consistency:24758} Straining: {yes/no:19897}.  Splinting: {yes/no:19897}.  Incomplete evacuation: {yes/no:19897}.  She {denies/ admits to:24761} accidental bowel leakage / fecal incontinence  Occurs: *** time(s) per {Time; day/week/month:13537}  Consistency with leakage: {stool consistency:24758} Bowel regimen: {bowel regimen:24759} Last colonoscopy: Date ***, Results ***  Sexual  Function Sexually active: {yes/no:19897}.  Sexual orientation: {Sexual Orientation:[phone]} Pain with sex: {pain with sex:24762}  Pelvic Pain {denies/ admits to:24761} pelvic pain Location: *** Pain occurs: *** Prior pain treatment: *** Improved by: *** Worsened by: ***   Past Medical History:  Past Medical History:  Diagnosis Date  . Anxiety   . Colitis   . Fibroadenoma of both breasts   . GERD (gastroesophageal reflux disease)    Zantac    . Gestational diabetes   . History of gestational diabetes 09/25/2014   Diet Controlled; 76%ile at 38 wks   . History of postpartum depression 07/06/2014  . Migraines   . PMDD (premenstrual dysphoric disorder)   . Supervision of normal pregnancy 09/14/2011   [facility] (transfer from [facility]) Genetic Screen  Reports negative  Anatomic [place]  Normal @ 20 wks; poor visual of spine>rescan nml  Glucose Screen 150 >> needs 3 hr GTT  GBS   Feeding Preference  Breast  Contraception   Circumcision          Past Surgical History:   Past Surgical History:  Procedure Laterality Date  . CESAREAN SECTION  01/30/2012   Procedure: CESAREAN SECTION;  Surgeon: Jonnie Kind, MD;  Location: [facility];  Service: Gynecology;  Laterality: N/A;  Primary Cesarean Section Delivery Baby Boy @ [phone], Apgars 4/9   . CESAREAN SECTION N/A 12/04/2014   Procedure: CESAREAN SECTION;  Surgeon: Lavonia Drafts, MD;  Location: [facility];  Service: Obstetrics;  Laterality: N/A;  Requested 12/03/14 @4 :00p  . MANDIBLE SURGERY  35 yo   Missing bone  . SEPTOPLASTY    . WISDOM TOOTH EXTRACTION  2017     Past OB/GYN History: G{NUMBERS 1-10:18281} P{NUMBERS 1-10:18281} Vaginal deliveries: ***,  Forceps/ Vacuum deliveries: ***, Cesarean section: ***  Menopausal: {menopausal:24763} Contraception: ***. Last pap smear was ***.  Any history of abnormal pap smears: {yes/no:19897}.   Medications: She has a current medication list which  includes the following prescription(s): acetaminophen, albuterol, albuterol, baclofen, cetirizine, dexlansoprazole, hyoscyamine, hyoscyamine sulfate sl, lansoprazole, multivitamin, ondansetron, sertraline, [DISCONTINUED] amitriptyline, and [DISCONTINUED] escitalopram.   Allergies: Patient is allergic to hydrocodone-acetaminophen, sulfa antibiotics, adhesive [tape], and bactrim [sulfamethoxazole-trimethoprim].   Social History:  Social History   Tobacco Use  . Smoking status: Former Research scientist (life sciences)  . Smokeless tobacco: Never Used  Vaping Use  . Vaping Use: Never used  Substance Use Topics  . Alcohol use: No  . Drug use: No    Relationship status: {relationship status:24764} She lives with ***.   She {ACTION; IS/IS LPF:79024097} employed ***. Regular exercise: {Yes/No:304960894} History of abuse: {Yes/No:304960894}  Family History:   Family History  Problem Relation Age of Onset  . Hypertension Mother   . Anxiety disorder Mother   . Anxiety disorder Father   . Depression Brother   . Anxiety disorder Brother      Review of Systems: ROS   OBJECTIVE Physical Exam: There were no vitals filed for this visit.  Physical Exam   GU / Detailed Urogynecologic Evaluation:  Pelvic Exam: Normal external female genitalia; Bartholin's and Skene's glands normal in appearance; urethral meatus normal in appearance, no urethral masses or discharge.   CST: {gen negative/positive:315881}  Reflexes: bulbocavernosis {DESC; PRESENT/NOT PRESENT:21021351}, anocutaneous {DESC; PRESENT/NOT PRESENT:21021351} ***bilaterally.  Speculum exam reveals normal vaginal mucosa {With/Without:20273} atrophy. Cervix {exam; gyn cervix:30847}. Uterus {exam; pelvic uterus:30849}. Adnexa {exam; adnexa:12223}.    s/p hysterectomy: Speculum exam reveals normal vaginal mucosa {With/Without:20273}  atrophy and normal vaginal cuff.  Adnexa {exam; adnexa:12223}.    With apex supported, anterior compartment defect was  {reduced:24765}  Pelvic floor strength {Roman # I-V:19040}/V, puborectalis {Roman # I-V:19040}/V external anal sphincter {Roman # I-V:19040}/V  Pelvic floor musculature: Right levator {Tender/Non-tender:20250}, Right obturator {Tender/Non-tender:20250}, Left levator {Tender/Non-tender:20250}, Left obturator {Tender/Non-tender:20250}  POP-Q:   POP-Q                                               Aa                                               Ba                                                 C                                                Gh                                               Pb  tvl                                                Ap                                               Bp                                                 D     Rectal Exam:  Normal sphincter tone, {rectocele:24766} distal rectocele, enterocoele {DESC; PRESENT/NOT PRESENT:21021351}, no rectal masses, {sign of:24767} dyssynergia when asking the patient to bear down.  Post-Void Residual (PVR) by Bladder Scan: In order to evaluate bladder emptying, we discussed obtaining a postvoid residual and she agreed to this procedure.  Procedure: The ultrasound unit was placed on the patient's abdomen in the suprapubic region after the patient had voided. A PVR of *** ml was obtained by bladder scan.  Laboratory Results: @ENCLABS @   ***I visualized the urine specimen, noting the specimen to be {urine color:24768}  ASSESSMENT AND PLAN Stephanie Hopkins is a 30 y.o. with: No diagnosis found.    Jaquita Folds, MD   Medical Decision Making:  - Reviewed/ ordered a clinical laboratory test - Reviewed/ ordered a radiologic study - Reviewed/ ordered medicine test - Decision to obtain old records - Discussion of management of or test interpretation with an external physician / other healthcare professional  - Assessment requiring independent  historian - Review and summation of prior records - Independent review of image, tracing or specimen

## 2020-07-11 NOTE — Telephone Encounter (Signed)
Attempted to call patient to confirm appt and update history. Message left

## 2020-07-12 ENCOUNTER — Ambulatory Visit: Payer: 59 | Admitting: Obstetrics and Gynecology

## 2020-08-26 ENCOUNTER — Other Ambulatory Visit: Payer: Self-pay | Admitting: *Deleted

## 2020-08-26 DIAGNOSIS — N63 Unspecified lump in unspecified breast: Secondary | ICD-10-CM

## 2020-08-26 DIAGNOSIS — Z09 Encounter for follow-up examination after completed treatment for conditions other than malignant neoplasm: Secondary | ICD-10-CM

## 2020-10-30 ENCOUNTER — Encounter: Payer: Self-pay | Admitting: Obstetrics & Gynecology

## 2020-10-30 ENCOUNTER — Other Ambulatory Visit: Payer: Self-pay

## 2020-10-30 ENCOUNTER — Other Ambulatory Visit (HOSPITAL_COMMUNITY)
Admission: RE | Admit: 2020-10-30 | Discharge: 2020-10-30 | Disposition: A | Discharge status: Home / Self Care | Payer: Self-pay | Source: Ambulatory Visit | Attending: Obstetrics & Gynecology | Admitting: Obstetrics & Gynecology

## 2020-10-30 ENCOUNTER — Ambulatory Visit (INDEPENDENT_AMBULATORY_CARE_PROVIDER_SITE_OTHER): Payer: Self-pay | Admitting: Obstetrics & Gynecology

## 2020-10-30 VITALS — BP 125/86 | HR 78 | Ht 64.0 in | Wt 213.0 lb

## 2020-10-30 DIAGNOSIS — B373 Candidiasis of vulva and vagina: Secondary | ICD-10-CM

## 2020-10-30 DIAGNOSIS — N898 Other specified noninflammatory disorders of vagina: Secondary | ICD-10-CM

## 2020-10-30 DIAGNOSIS — B3731 Acute candidiasis of vulva and vagina: Secondary | ICD-10-CM

## 2020-10-30 MED ORDER — CLOBETASOL PROPIONATE 0.05 % EX OINT: TOPICAL_OINTMENT | CUTANEOUS | 1 refills | Status: DC

## 2020-10-30 NOTE — Progress Notes (Signed)
   Subjective:    Patient ID: Stephanie Hopkins, female    DOB: 1986/02/03, 35 y.o.   MRN: [id number]  HPI  35 yo female presents for vaginal itcing.  Pt began having yeast infections in 2020.  Saw [facility] provider diagnosed her with recurrent yeast and treated with diflucan.  Pt gets a yeast infection every other month.  She finds herself scratching at night.  She has tried vagisil which has not helped.  She did try a friend's topical lidocaine which did help.    Review of Systems  Constitutional: Negative.   Respiratory: Negative.    Cardiovascular: Negative.   Gastrointestinal: Negative.   Genitourinary:  Positive for vaginal discharge and vaginal pain.  Skin:  Positive for rash.  Psychiatric/Behavioral: Negative.        Objective:   Physical Exam Vitals reviewed.  Constitutional:      General: She is not in acute distress.    Appearance: Normal appearance. She is well-developed. She is obese.  HENT:     Head: Normocephalic and atraumatic.  Eyes:     Conjunctiva/sclera: Conjunctivae normal.  Cardiovascular:     Rate and Rhythm: Normal rate.  Pulmonary:     Effort: Pulmonary effort is normal.  Abdominal:     General: Abdomen is flat.     Palpations: Abdomen is soft.     Tenderness: There is no abdominal tenderness.  Genitourinary:    Comments: Tanner V, shaved with stubble Vulva:  No lesion; excoriation on right labia majora Vagina:  Pink, no lesions, no discharge, no blood Cervix:  No CMT Uterus:  Non tender, mobile Right adnexa--non tender, no mass Left adnexa--non tender, no mass   Skin:    General: Skin is warm and dry.  Neurological:     Mental Status: She is alert and oriented to person, place, and time.  Psychiatric:        Mood and Affect: Mood normal.  Vitals:   10/30/20 1037  BP: 125/86  Pulse: 78  Weight: 213 lb (96.6 kg)  Height: '5\' 4"'$  (1.626 m)       Assessment & Plan:  35 yo female with recurrent yeast and chronic vulvar itching   No lesion needing  to be biopsied. Stop Vagisil Good vulvar hygiene reviewed. 4.  Cetaphil or Dove 5.  Clobetasol ointment taper 6.  Aptima sent 7.  Refrain from shaving; clip close 8.  Ice packs to area of itching arises to help decrease urge to itch  30 minutes spent with patient reviewing outside records ([facility]), history & physical, counseling and documentation.

## 2020-10-31 LAB — CERVICOVAGINAL ANCILLARY ONLY
Bacterial Vaginitis (gardnerella): NEGATIVE
Candida Glabrata: NEGATIVE
Candida Vaginitis: NEGATIVE
Chlamydia: NEGATIVE
Comment: NEGATIVE
Comment: NEGATIVE
Comment: NEGATIVE
Comment: NEGATIVE
Comment: NEGATIVE
Comment: NORMAL
Neisseria Gonorrhea: NEGATIVE
Trichomonas: NEGATIVE

## 2020-11-03 DIAGNOSIS — Z8669 Personal history of other diseases of the nervous system and sense organs: Secondary | ICD-10-CM | POA: Insufficient documentation

## 2020-12-16 ENCOUNTER — Ambulatory Visit: Payer: Self-pay | Admitting: Obstetrics and Gynecology

## 2021-05-19 IMAGING — US US PELVIS COMPLETE WITH TRANSVAGINAL
1 series · 14 of 25 positions shown · non-contrast
Comparison: 04/07/2017

CLINICAL DATA: Pelvic pain for over 1 year.  LMP 04/29/2020.

EXAM:
TRANSABDOMINAL AND TRANSVAGINAL ULTRASOUND OF PELVIS
TECHNIQUE: Both transabdominal and transvaginal ultrasound examinations of the
pelvis were performed. Transabdominal technique was performed for
global imaging of the pelvis including uterus, ovaries, adnexal
regions, and pelvic cul-de-sac. It was necessary to proceed with
endovaginal exam following the transabdominal exam to visualize the
endometrium and ovaries.

[Series 1: us pelvis complete with transvaginal · 0.22mm/px · 120 acquisitions, 14 frames shown]
[im 1/120]
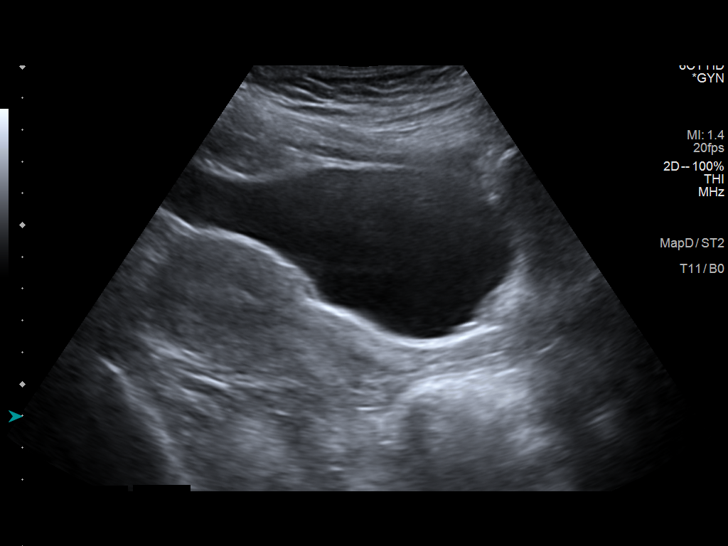
[im 10/120]
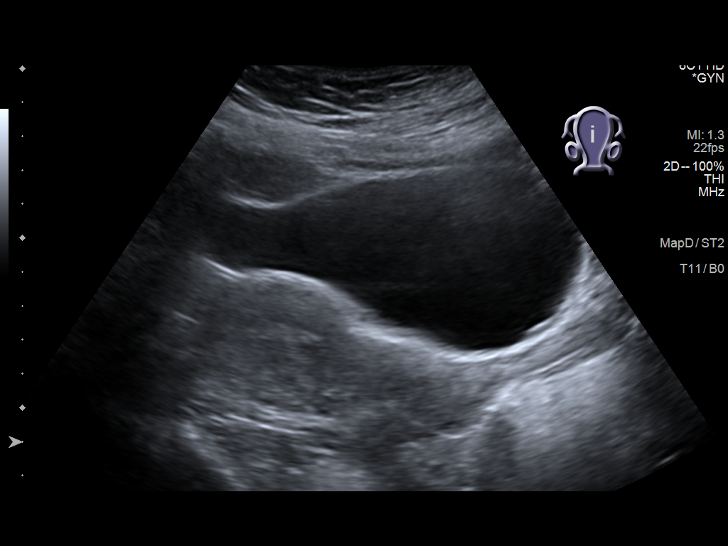
[im 20/120]
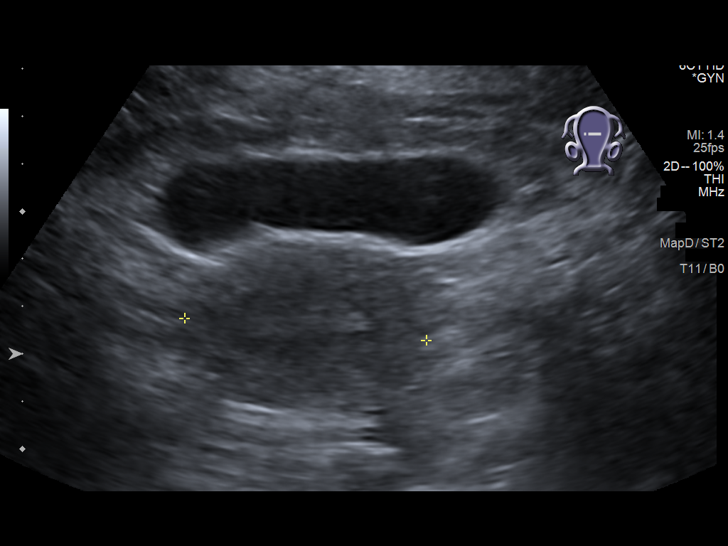
[im 30/120]
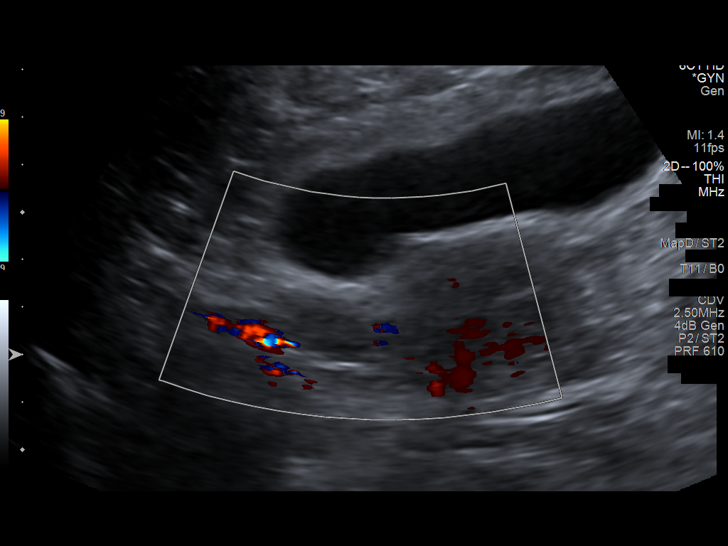
[im 40/120]
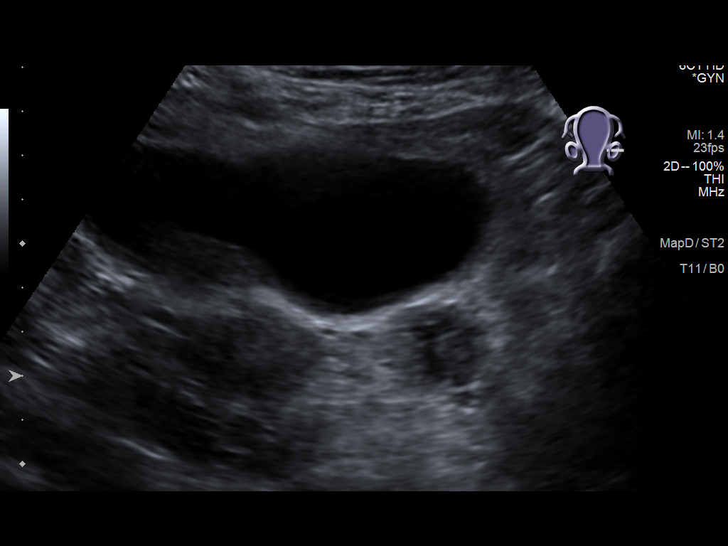
[im 45/120]
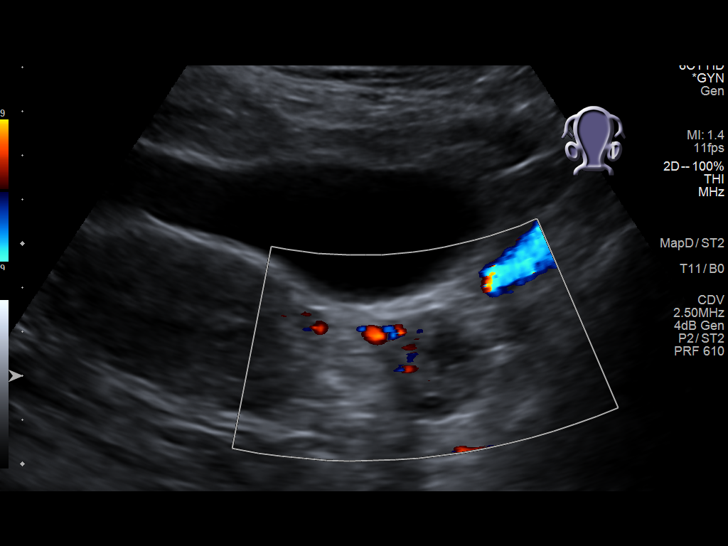
[im 55/120]
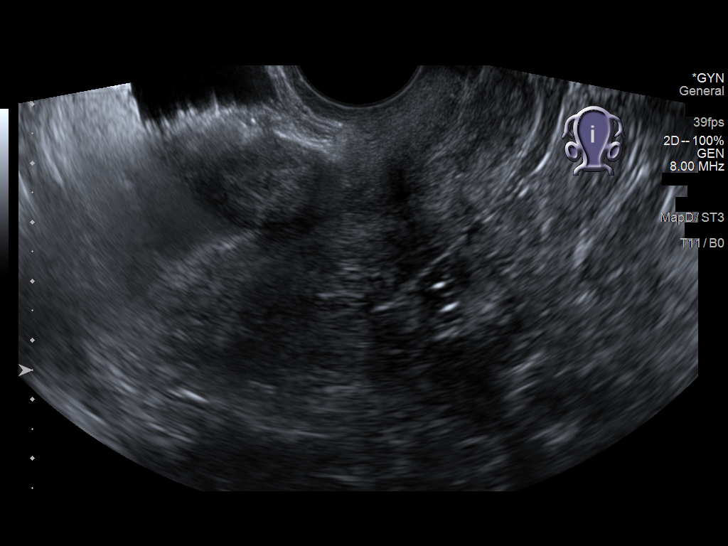
[im 65/120]
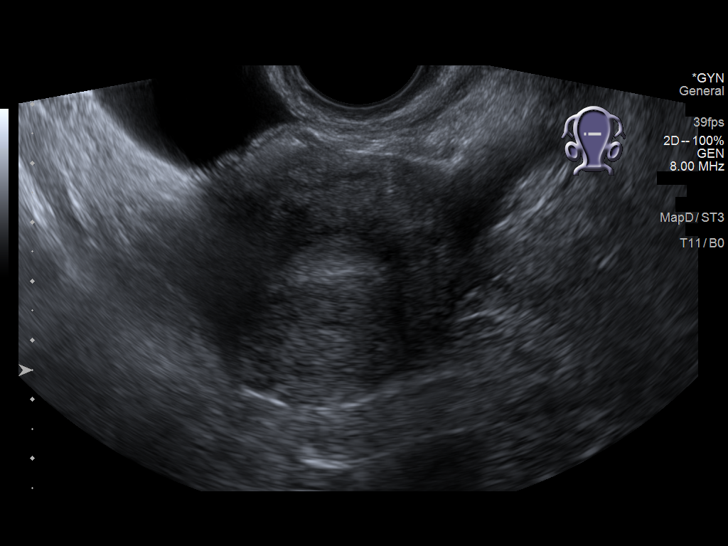
[im 75/120]
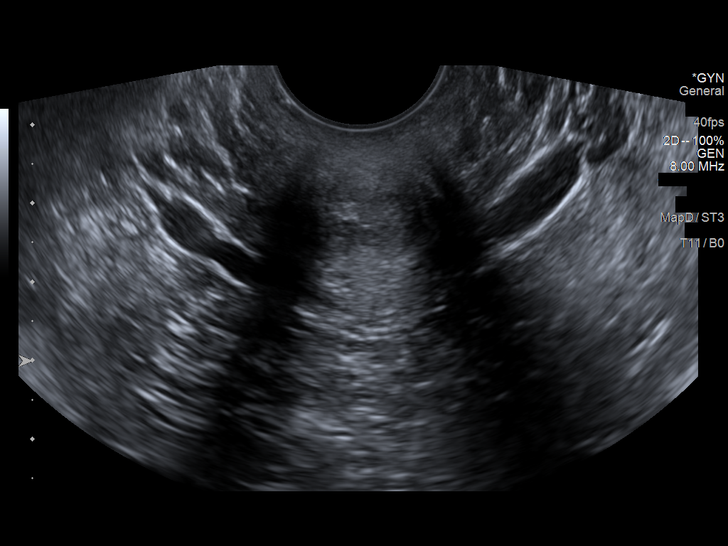
[im 80/120]
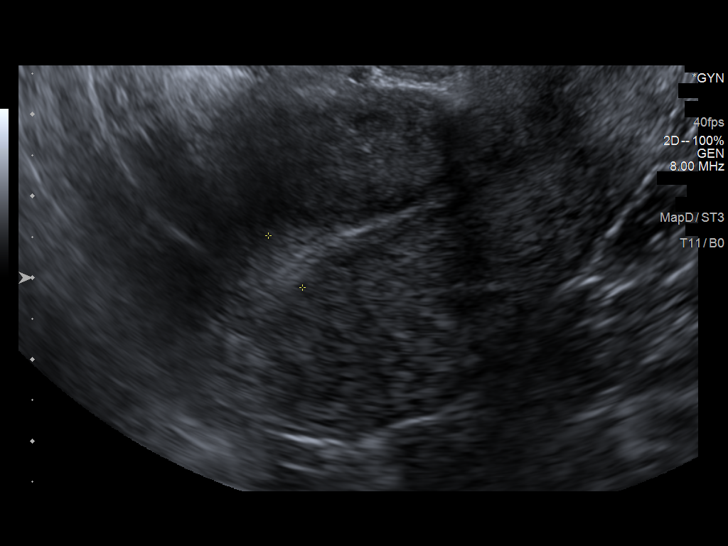
[im 90/120]
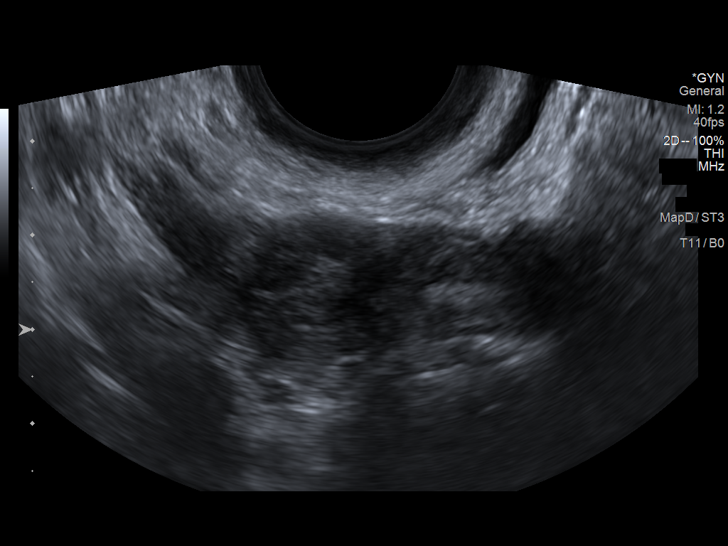
[im 100/120]
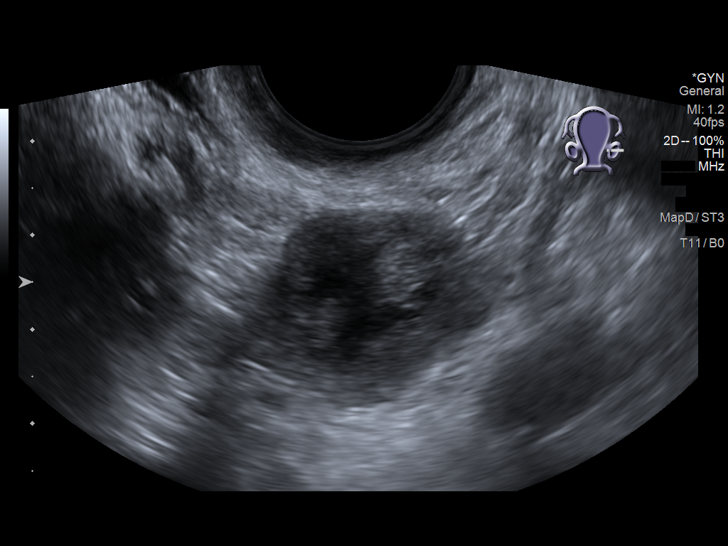
[im 110/120]
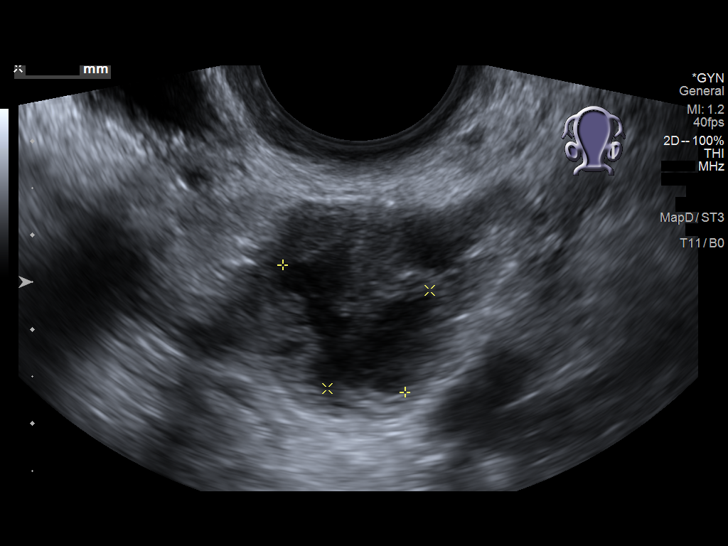
[im 120/120]
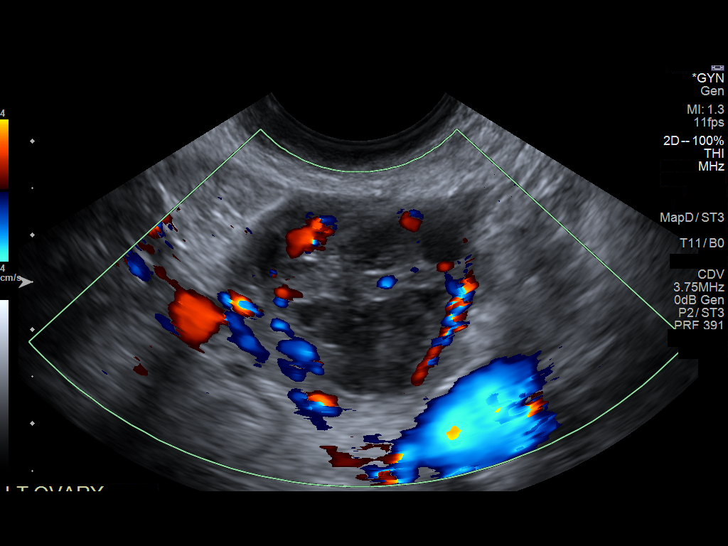

[14 of 25 positions shown; findings below may reference images not displayed]

FINDINGS: Uterus

Measurements: 8.7 x 3.8 x 5.1 cm = volume: 84 mL. No fibroids or
other mass visualized. Prior C-section scar noted.

Endometrium

Thickness: 8 mm.  No focal abnormality visualized.

Right ovary

Measurements: 3.1 x 1.8 x 1.7 cm = volume: 5 mL. Normal
appearance/no adnexal mass.

Left ovary

Measurements: 3.0 x 2.5 x 2.7 cm = volume: 10 mL. Small involuting
corpus luteum incidentally noted. Otherwise normal appearance/no
adnexal mass.

Other findings

No abnormal free fluid.
IMPRESSION: Unremarkable appearance of uterus and ovaries. No mass or other
significant abnormality identified.

## 2021-06-23 ENCOUNTER — Other Ambulatory Visit: Payer: Self-pay | Admitting: *Deleted

## 2021-06-23 DIAGNOSIS — N6320 Unspecified lump in the left breast, unspecified quadrant: Secondary | ICD-10-CM

## 2021-06-23 DIAGNOSIS — N6489 Other specified disorders of breast: Secondary | ICD-10-CM

## 2021-06-25 ENCOUNTER — Telehealth: Payer: Self-pay

## 2021-06-25 NOTE — Telephone Encounter (Signed)
Telephoned patient at mobile number. Left voice message with BCCCP contact informtaion. ?

## 2021-07-15 ENCOUNTER — Ambulatory Visit: Payer: Self-pay | Admitting: *Deleted

## 2021-07-15 VITALS — BP 120/82 | Wt 214.4 lb

## 2021-07-15 DIAGNOSIS — N6321 Unspecified lump in the left breast, upper outer quadrant: Secondary | ICD-10-CM

## 2021-07-15 DIAGNOSIS — N644 Mastodynia: Secondary | ICD-10-CM

## 2021-07-15 DIAGNOSIS — Z1239 Encounter for other screening for malignant neoplasm of breast: Secondary | ICD-10-CM

## 2021-07-15 NOTE — Progress Notes (Signed)
Ms. Stephanie Hopkins is a 36 y.o. female who presents to [facility] clinic today with complaint of left outer breast pain and lump x one month. Patient states the pain comes and goes. Patient rates the pain at a 3 out of 10. Patient complained or right breast clear to yellowish discharge when expresses that was sent for evaluation 03/25/2017 that showed rare apocrine cells within the right breast discharge. Patient was referred for a surgical consult and patient stated surgery was not recommended at the time. Patients last diagnostic mammogram and ultrasound was completed 03/25/2017 that 84-monthfollow was recommended. Patient stated she has had [facility] Let patient know she will need to sign a records release to have images sent to the [facility] Patient given release to complete and take to the [facility]. ?  ?Pap Smear: Pap smear not completed today. [facility] and was normal with negative HPV. Per patient has no history of an abnormal Pap smear. Last Pap smear result is available in Epic. ?  ?Physical exam: ?Breasts ?Right breast slightly larger than left breast that per patient is normal for her. No skin abnormalities right breast. Swollen area observed left nipple at 6 o'clock. No nipple retraction bilateral breasts. No breast discharge expressed on exam. No lymphadenopathy. No lumps palpated right breast. Palpated a pea sized lump within the left breast at 1 o'clock 10.5 cm from the nipple. Complaints of tenderness when palpated left breast lump and area around lump on exam.   ? ?MM DIAG BREAST TOMO UNI RIGHT ? ?Result Date: 10/05/2017 ?CLINICAL DATA:  36year old female for follow-up of RIGHT breast asymmetries without sonographic correlates EXAM: DIGITAL DIAGNOSTIC UNILATERAL RIGHT MAMMOGRAM WITH CAD AND TOMO COMPARISON:  03/25/2017 mammograms and ultrasound ACR Breast Density Category a: The breast  tissue is almost entirely fatty. FINDINGS: 2D/3D full field views of the RIGHT breast demonstrate 2 stable asymmetries within the [facility], the posterior asymmetry remains very faint. No new RIGHT breast abnormality noted. Mammographic images were processed with CAD. IMPRESSION: Stable likely benign asymmetries within the INNER RIGHT breast. 6 month follow-up is recommended to ensure 1 year stability. RECOMMENDATION: RIGHT diagnostic mammogram in 6 months. I have discussed the findings and recommendations with the patient. Results were also provided in writing at the conclusion of the visit. If applicable, a reminder letter will be sent to the patient regarding the next appointment. BI-RADS CATEGORY  3: Probably benign. Electronically Signed   By: JMargarette CanadaM.D.   On: 10/05/2017 14:40  ? ?MM DIAG BREAST TOMO BILATERAL ? ?Result Date: 03/25/2017 ?CLINICAL DATA:  Palpable lump in the left breast. EXAM: 2D DIGITAL DIAGNOSTIC BILATERAL MAMMOGRAM WITH CAD AND ADJUNCT TOMO ULTRASOUND BILATERAL BREAST COMPARISON:  Previous exam(s). ACR Breast Density Category b: There are scattered areas of fibroglandular density. FINDINGS: There are 2 asymmetries in the medial right breast. The first is oval, spanning 15 mm, and likely located at or superior to the level of the nipple. The other is much less conspicuous located in the mid to posterior depth on the CC view, likely located in the inferior breast based on tomo images, not well seen on the MLO view. No other suspicious mammographic findings. Mammographic images were processed with CAD. On physical exam, no suspicious lumps are identified. Targeted ultrasound is performed, showing no abnormalities in the right breast. No abnormalities in the region of palpable lump on the  left. IMPRESSION: Two probably benign right breast asymmetries/masses. No suspicious findings on the left. RECOMMENDATION: Six-month follow-up mammogram of the probably benign findings in the  medial right breast. Treatment of the palpable lump on the left should be based on clinical and physical exam given the lack of imaging findings. I have discussed the findings and recommendations with the patient. Results were also provided in writing at the conclusion of the visit. If applicable, a reminder letter will be sent to the patient regarding the next appointment. BI-RADS CATEGORY  3: Probably benign. Electronically Signed   By: Dorise Bullion III M.D   On: 03/25/2017 11:45   ?  ?Pelvic/Bimanual ?Pap is not indicated today per BCCCP guidelines.  ?  ?Smoking History: ?Patient is a former smoker that stated she quit 10 years ago. ?  ?Patient Navigation: ?Patient education provided. Access to services provided for patient through [facility] program.  ?  ?Breast and Cervical Cancer Risk Assessment: ?Patient does not have family history of breast cancer, known genetic mutations, or radiation treatment to the chest before age 53. Patient does not have history of cervical dysplasia, immunocompromised, or DES exposure in-utero. ? ?Risk Assessment   ? ? Risk Scores   ? ?   07/15/2021  ? Last edited by: Royston Bake, CMA  ? 5-year risk: 0.3 %  ? Lifetime risk: 10.4 %  ? ?  ?  ? ?  ? ?A: ?BCCCP exam without pap smear ?Complaint of left breast pain and right breast discharge. ? ?P: ?Referred patient to the [facility] for a diagnostic mammogram per recommendation. Appointment scheduled Friday, Jul 18, 2021 at 0930. ? ?Loletta Parish, RN ?07/15/2021 1:54 PM   ?

## 2021-07-15 NOTE — Patient Instructions (Signed)
Explained breast self awareness with Marquis Lunch. Patient did not need a Pap smear today due to last Pap smear and HPV typing was 04/18/2020. Let her know BCCCP will cover Pap smears and HPV typing every 5 years unless has a history of abnormal Pap smears. Referred patient to the [facility] for a diagnostic mammogram per recommendation. Appointment scheduled Friday, Jul 18, 2021 at 0930. Patient aware of appointment and will be there. Annali Lybrand verbalized understanding. ? ?Abdulrahman Bracey, Arvil Chaco, RN ?1:55 PM ? ? ? ? ?

## 2021-07-17 ENCOUNTER — Other Ambulatory Visit: Payer: Self-pay

## 2021-07-18 ENCOUNTER — Other Ambulatory Visit: Payer: Self-pay

## 2021-07-24 ENCOUNTER — Other Ambulatory Visit: Payer: Self-pay

## 2021-08-07 ENCOUNTER — Other Ambulatory Visit: Payer: Self-pay

## 2021-08-21 ENCOUNTER — Other Ambulatory Visit: Payer: Self-pay | Admitting: Obstetrics and Gynecology

## 2021-08-21 DIAGNOSIS — N6489 Other specified disorders of breast: Secondary | ICD-10-CM

## 2021-09-12 ENCOUNTER — Ambulatory Visit
Admission: RE | Admit: 2021-09-12 | Discharge: 2021-09-12 | Disposition: A | Discharge status: Home / Self Care | Payer: Medicaid Other | Source: Ambulatory Visit | Attending: Obstetrics and Gynecology | Admitting: Obstetrics and Gynecology

## 2021-09-12 ENCOUNTER — Ambulatory Visit
Admission: RE | Admit: 2021-09-12 | Discharge: 2021-09-12 | Disposition: A | Discharge status: Home / Self Care | Payer: No Typology Code available for payment source | Source: Ambulatory Visit | Attending: Obstetrics and Gynecology | Admitting: Obstetrics and Gynecology

## 2021-09-12 DIAGNOSIS — N6489 Other specified disorders of breast: Secondary | ICD-10-CM

## 2021-09-12 DIAGNOSIS — N6320 Unspecified lump in the left breast, unspecified quadrant: Secondary | ICD-10-CM

## 2021-11-13 ENCOUNTER — Ambulatory Visit (INDEPENDENT_AMBULATORY_CARE_PROVIDER_SITE_OTHER): Payer: Medicaid Other | Admitting: Obstetrics & Gynecology

## 2021-11-13 ENCOUNTER — Encounter: Payer: Self-pay | Admitting: Obstetrics & Gynecology

## 2021-11-13 VITALS — BP 129/84 | HR 84 | Ht 64.0 in | Wt 206.0 lb

## 2021-11-13 DIAGNOSIS — Z308 Encounter for other contraceptive management: Secondary | ICD-10-CM | POA: Diagnosis not present

## 2021-11-13 DIAGNOSIS — Z01419 Encounter for gynecological examination (general) (routine) without abnormal findings: Secondary | ICD-10-CM | POA: Diagnosis not present

## 2021-11-13 DIAGNOSIS — Z3040 Encounter for surveillance of contraceptives, unspecified: Secondary | ICD-10-CM

## 2021-11-13 NOTE — Progress Notes (Signed)
GYNECOLOGY ANNUAL PREVENTATIVE CARE ENCOUNTER NOTE  History:     Stephanie Hopkins is a 36 y.o. [phone] female here for a routine annual gynecologic exam.  Current complaints: continued vulvar itching, had negative evaluation for this in the past. No lesions or abnormal vaginal bleeding, discharge, pelvic pain, problems with intercourse or other significant gynecologic concerns.    Gynecologic History Patient's last menstrual period was 10/25/2021. Contraception: condoms Last Pap: 04/18/2020. Result was normal with negative HPV Last Mammogram: 09/12/2021.  Result was normal. Done for pain.   Obstetric History OB History  Gravida Para Term Preterm AB Living  '4 2 2   2 2  '$ SAB IAB Ectopic Multiple Live Births  1     0 2    # Outcome Date GA Lbr Len/2nd Weight Sex Delivery Anes PTL Lv  4 Term 12/04/14 [redacted]w[redacted]d 7 lb 8.5 oz (3.415 kg) F CS-LTranv Spinal  LIV  3 SAB 08/14/13        FD  2 Term 01/30/12 [id number] / 04:36 8 lb 4.5 oz (3.755 kg) M CS-LTranv EPI  LIV     Complications: Gestational diabetes  1 AB 09/03/10 [id number]    DEC    Obstetric Comments  Gestational Diabetes    Past Medical History:  Diagnosis Date   Anxiety    Colitis    Fibroadenoma of both breasts    GERD (gastroesophageal reflux disease)    Zantac     Gestational diabetes    History of gestational diabetes 09/25/2014   Diet Controlled; 76%ile at 38 wks    History of postpartum depression 07/06/2014   Migraines    PMDD (premenstrual dysphoric disorder)    Supervision of normal pregnancy 09/14/2011   [place]  Reports negative  Anatomic US [place] @ 20 wks; poor visual of spine>rescan nml  Glucose Screen 150 >> needs 3 hr GTT  GBS   Feeding Preference  Breast  Contraception   Circumcision         Past Surgical History:  Procedure Laterality Date   CESAREAN SECTION  01/30/2012   Procedure: CESAREAN SECTION;  Surgeon: JohJonnie KindMD;  [facility];  Service: Gynecology;  Laterality: N/A;  Primary Cesarean Section Delivery Baby Boy @ [id number] 4/9    CESAREAN SECTION N/A 12/04/2014   Procedure: CESAREAN SECTION;  Surgeon: CarLavonia DraftsD;  [facility];  Service: Obstetrics;  Laterality: N/A;  Requested 12/03/14 '@4'$ :00p   MANDIBLE SURGERY  13 7   Missing bone   SEPTOPLASTY     WISDOM TOOTH EXTRACTION  2017    Current Outpatient Medications on File Prior to Visit  Medication Sig Dispense Refill   omeprazole (PRILOSEC) 40 MG capsule Take 40 mg by mouth 2 (two) times daily.     sertraline (ZOLOFT) 25 MG tablet Take 25 mg by mouth daily.     acetaminophen (TYLENOL) 500 MG tablet Take 1,000 mg by mouth every 6 (six) hours as needed for mild pain or headache.  (Patient not taking: Reported on 10/30/2020)     albuterol (VENTOLIN HFA) 108 (90 Base) MCG/ACT inhaler Inhale into the lungs. (Patient not taking: Reported on 07/15/2021)     albuterol (VENTOLIN HFA) 108 (90 Base) MCG/ACT inhaler Inhale into the lungs. (Patient not taking: Reported on 07/15/2021)     Baclofen 5 MG TABS Take 1 tablet by mouth 4 (four) times daily. (  Patient not taking: Reported on 11/13/2021)     cetirizine (ZYRTEC) 10 MG tablet Take 10 mg by mouth daily. (Patient not taking: Reported on 11/13/2021)     clobetasol ointment (TEMOVATE) 0.05 % Apply twice a day for 2 weeks, then once a day for 2 weeks, then twice a week for 2 weeks. (Patient not taking: Reported on 07/15/2021) 30 g 1   fluticasone (FLONASE) 50 MCG/ACT nasal spray Place into the nose. (Patient not taking: Reported on 07/15/2021)     hyoscyamine (LEVSIN SL) 0.125 MG SL tablet SMARTSIG:2 Sublingual Twice Daily PRN (Patient not taking: Reported on 07/15/2021)     Hyoscyamine Sulfate SL 0.125 MG SUBL  (Patient not taking: Reported on 07/15/2021)     Multiple Vitamin (MULTIVITAMIN) tablet Take by mouth. (Patient not taking: Reported on 07/15/2021)     ondansetron (ZOFRAN) 4 MG tablet Take by  mouth. (Patient not taking: Reported on 07/15/2021)     [DISCONTINUED] amitriptyline (ELAVIL) 10 MG tablet TK 2 1/2 T PO QHS  11   [DISCONTINUED] escitalopram (LEXAPRO) 10 MG tablet Take 1 tablet (10 mg total) by mouth daily. 45 tablet 1   No current facility-administered medications on file prior to visit.    Allergies  Allergen Reactions   Hydrocodone-Acetaminophen Other (See Comments)   Sulfa Antibiotics Rash   Adhesive [Tape] Rash   Bactrim [Sulfamethoxazole-Trimethoprim] Rash    Social History:  reports that she quit smoking about 10 years ago. Her smoking use included cigarettes. She has never used smokeless tobacco. She reports current alcohol use. She reports that she does not use drugs.  Family History  Problem Relation Age of Onset   Hypertension Mother    Anxiety disorder Mother    Anxiety disorder Father    Depression Brother    Anxiety disorder Brother     The following portions of the patient's history were reviewed and updated as appropriate: allergies, current medications, past family history, past medical history, past social history, past surgical history and problem list.  Review of Systems Pertinent items noted in HPI and remainder of comprehensive ROS otherwise negative.  Physical Exam:  BP 129/84   Pulse 84   Ht '5\' 4"'$  (1.626 m)   Wt 206 lb (93.4 kg)   LMP 10/25/2021   BMI 35.36 kg/m  CONSTITUTIONAL: Well-developed, well-nourished female in no acute distress.  HENT:  Normocephalic, atraumatic, External right and left ear normal.  EYES: Conjunctivae and EOM are normal. Pupils are equal, round, and reactive to light. No scleral icterus.  NECK: Normal range of motion, supple, no masses.  Normal thyroid.  SKIN: Skin is warm and dry. No rash noted. Not diaphoretic. No erythema. No pallor. MUSCULOSKELETAL: Normal range of motion. No tenderness.  No cyanosis, clubbing, or edema. NEUROLOGIC: Alert and oriented to person, place, and time. Normal reflexes, muscle  tone coordination.  PSYCHIATRIC: Normal mood and affect. Normal behavior. Normal judgment and thought content. CARDIOVASCULAR: Normal heart rate noted, regular rhythm RESPIRATORY: Clear to auscultation bilaterally. Effort and breath sounds normal, no problems with respiration noted. BREASTS: Symmetric in size. No masses, tenderness, skin changes, nipple drainage, or lymphadenopathy bilaterally. Performed in the presence of a chaperone. ABDOMEN: Soft, no distention noted.  Mild suprapubic tenderness, no other tenderness, rebound or guarding.  PELVIC: Normal appearing external genitalia and urethral meatus; normal appearing vaginal mucosa and cervix. No vulvar erythema, lesions.  No abnormal vaginal discharge noted.   Normal uterine size, no other palpable masses, no uterine or adnexal tenderness.  Performed in the presence of a chaperone.   Assessment and Plan:     1. Encounter for surveillance of contraceptives, unspecified contraceptive Uses condoms, declines other methods for now.  2. Well woman exam with routine gynecological exam Up to date on pap smear.  Normal vulvar exam, patient reassured. Had negative evaluation of vulvar itching several times in past. Routine preventative health maintenance measures emphasized. Please refer to After Visit Summary for other counseling recommendations.      Verita Schneiders, MD, [place] for [employer], [place]

## 2022-02-24 ENCOUNTER — Emergency Department (HOSPITAL_BASED_OUTPATIENT_CLINIC_OR_DEPARTMENT_OTHER)
Admission: EM | Admit: 2022-02-24 | Discharge: 2022-02-24 | Disposition: A | Discharge status: Home / Self Care | Payer: 59 | Attending: Emergency Medicine | Admitting: Emergency Medicine

## 2022-02-24 ENCOUNTER — Other Ambulatory Visit: Payer: Self-pay

## 2022-02-24 DIAGNOSIS — G43909 Migraine, unspecified, not intractable, without status migrainosus: Secondary | ICD-10-CM | POA: Insufficient documentation

## 2022-02-24 DIAGNOSIS — M542 Cervicalgia: Secondary | ICD-10-CM | POA: Insufficient documentation

## 2022-02-24 DIAGNOSIS — Y92410 Unspecified street and highway as the place of occurrence of the external cause: Secondary | ICD-10-CM | POA: Insufficient documentation

## 2022-02-24 DIAGNOSIS — R519 Headache, unspecified: Secondary | ICD-10-CM | POA: Diagnosis present

## 2022-02-24 MED ORDER — NAPROXEN 375 MG PO TABS: 375.0000 mg | ORAL_TABLET | Freq: Two times a day (BID) | ORAL | 0 refills | Status: AC

## 2022-02-24 MED ORDER — PROMETHAZINE HCL 25 MG PO TABS: 25.0000 mg | ORAL_TABLET | Freq: Three times a day (TID) | ORAL | 0 refills | Status: AC | PRN

## 2022-02-24 MED ORDER — LIDOCAINE 5 % EX PTCH: 1.0000 | MEDICATED_PATCH | CUTANEOUS | 0 refills | Status: AC

## 2022-02-24 MED ORDER — KETOROLAC TROMETHAMINE 15 MG/ML IJ SOLN
15.0000 mg | Freq: Once | INTRAMUSCULAR | Status: AC
  Administered 2022-02-24: 15 mg via INTRAMUSCULAR
  Filled 2022-02-24: qty 1

## 2022-02-24 MED ORDER — METHOCARBAMOL 500 MG PO TABS: 500.0000 mg | ORAL_TABLET | Freq: Two times a day (BID) | ORAL | 0 refills | Status: AC

## 2022-02-24 NOTE — Discharge Instructions (Signed)
Your workup today was overall reassuring.  No concern for spinal fracture or other concerning findings.  You likely have muscle strains.  I have sent muscle relaxer called Robaxin into the pharmacy for you.  This will make you drowsy so do not drive or operate any heavy machinery or do anything else that is dangerous after taking this.  After naproxen which is an anti-inflammatory along with lidocaine patch.  I have given you a few doses of Phenergan as well.  For any concerning symptoms return to the emergency room otherwise follow-up with your primary care provider.
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
## 2022-06-03 DIAGNOSIS — M47812 Spondylosis without myelopathy or radiculopathy, cervical region: Secondary | ICD-10-CM | POA: Insufficient documentation

## 2022-11-20 NOTE — Progress Notes (Signed)
Pt no show

## 2022-11-23 ENCOUNTER — Encounter: Payer: 59 | Admitting: Obstetrics & Gynecology

## 2022-11-23 ENCOUNTER — Encounter: Payer: Self-pay | Admitting: Obstetrics & Gynecology

## 2022-12-06 NOTE — Progress Notes (Signed)
This encounter was created in error - please disregard.

## 2023-08-04 ENCOUNTER — Telehealth: Payer: Self-pay | Admitting: *Deleted

## 2023-08-04 NOTE — Telephone Encounter (Signed)
 Returned call. Left patient a message to call and schedule annual.

## 2023-08-23 ENCOUNTER — Other Ambulatory Visit (HOSPITAL_COMMUNITY)
Admission: RE | Admit: 2023-08-23 | Discharge: 2023-08-23 | Disposition: A | Discharge status: Home / Self Care | Source: Ambulatory Visit | Attending: Obstetrics & Gynecology | Admitting: Obstetrics & Gynecology

## 2023-08-23 ENCOUNTER — Ambulatory Visit: Admitting: Obstetrics & Gynecology

## 2023-08-23 ENCOUNTER — Encounter: Payer: Self-pay | Admitting: Obstetrics & Gynecology

## 2023-08-23 VITALS — BP 124/81 | HR 80 | Ht 64.0 in | Wt 206.0 lb

## 2023-08-23 DIAGNOSIS — N898 Other specified noninflammatory disorders of vagina: Secondary | ICD-10-CM | POA: Diagnosis not present

## 2023-08-23 DIAGNOSIS — R102 Pelvic and perineal pain: Secondary | ICD-10-CM

## 2023-08-23 DIAGNOSIS — Z1331 Encounter for screening for depression: Secondary | ICD-10-CM

## 2023-08-23 DIAGNOSIS — N6452 Nipple discharge: Secondary | ICD-10-CM

## 2023-08-23 DIAGNOSIS — Z01419 Encounter for gynecological examination (general) (routine) without abnormal findings: Secondary | ICD-10-CM

## 2023-08-23 MED ORDER — CLOBETASOL PROPIONATE 0.05 % EX OINT: TOPICAL_OINTMENT | CUTANEOUS | 0 refills | Status: AC

## 2023-08-23 NOTE — Progress Notes (Signed)
  Subjective:     Stephanie Hopkins is a 38 y.o. female here for a routine exam.  Current complaints: nipple discharge--was seen at [facility] for this and wants to return to [place].  Pelvic pressure continues--nml US  In 2022  Has nml monthly menses.     Gynecologic History Patient's last menstrual period was 08/07/2023 (exact date). Contraception: none Last pap smear (date and result):04/18/2020 WNL Last mammogram (date and result): 09/12/2021 negative Last colon screening (date and result):n/a Brush:yes Floss:yes Seatbelts: yes Sunscreen: yes  Obstetric History OB History  Gravida Para Term Preterm AB Living  4 2 2  2 2   SAB IAB Ectopic Multiple Live Births  1   0 2    # Outcome Date GA Lbr Len/2nd Weight Sex Type Anes PTL Lv  4 Term 12/04/14 [redacted]w[redacted]d  7 lb 8.5 oz (3.415 kg) F CS-LTranv Spinal  LIV  3 SAB 08/14/13        FD  2 Term 01/30/12 [redacted]w[redacted]d 04:19 / 04:36 8 lb 4.5 oz (3.755 kg) M CS-LTranv EPI  LIV     Complications: Gestational diabetes  1 AB 09/03/10 [redacted]w[redacted]d   U    DEC    Obstetric Comments  Gestational Diabetes     The following portions of the patient's history were reviewed and updated as appropriate: allergies, current medications, past family history, past medical history, past social history, past surgical history, and problem list.  Review of Systems Pertinent items noted in HPI and remainder of comprehensive ROS otherwise negative.    Objective:     Vitals:   08/23/23 1100  BP: 124/81  Pulse: 80  Weight: 206 lb (93.4 kg)  Height: 5\' 4"  (1.626 m)   Vitals:  WNL General appearance: alert, cooperative and no distress  HEENT: Normocephalic, without obvious abnormality, atraumatic Eyes: negative Throat: lips, mucosa, and tongue normal; teeth and gums normal  Respiratory: Clear to auscultation bilaterally  CV: Regular rate and rhythm  Breasts:  Normal appearance, no masses or tenderness, no nipple retraction or dimpling; + nipple discharge from right nipple   GI: Soft, non-tender; bowel sounds normal; no masses,  no organomegaly  GU: External Genitalia:  Tanner V, slight erythema between labia majora and minora;  Urethra:  No prolapse   Vagina: Pink, normal rugae, no blood or discharge  Cervix: No CMT, no lesion  Uterus:  Normal size and contour, non tender, mild uterine prolapse  Adnexa: Normal, no masses, non tender  Musculoskeletal: No edema, redness or tenderness in the calves or thighs  Skin: No lesions or rash-had biopsy of concerning lesion on abdomen--negative  Lymphatic: Axillary adenopathy: none     Psychiatric: Normal mood and behavior        Assessment:    Healthy female exam.  Vulvar itching Nipple discharge Pelvic pain and pressure   Plan:    Pap with cotesting Suggest daily MVI with folic acid 400 mcg in case of pregnancy Nipple discharge--check prolactin and refer for right diagnostic mammogram Pelvic pressure and pain--exam normal, mild uterine prolapse; rpt US .  Vulvar itching (chronic)--pt had improve with temovate  and would like to rpt.  She also had improvement with homemade toilet paper wipes but she used temovate  and homemade wipes at the same time so not sure which one worked.  Will try temovate  first.  Pt should stop using Dial soap---change to [place] or Cetaphil.  Make sure laundry detergent is scent free for underwear and towels.

## 2023-08-24 LAB — CYTOLOGY - PAP
Comment: NEGATIVE
Diagnosis: NEGATIVE
High risk HPV: NEGATIVE

## 2023-08-24 LAB — PROLACTIN: Prolactin: 6.6 ng/mL (ref 4.8–33.4)

## 2023-08-27 ENCOUNTER — Ambulatory Visit
Admission: RE | Admit: 2023-08-27 | Discharge: 2023-08-27 | Disposition: A | Discharge status: Home / Self Care | Source: Ambulatory Visit | Attending: Obstetrics & Gynecology | Admitting: Obstetrics & Gynecology

## 2023-08-27 DIAGNOSIS — R102 Pelvic and perineal pain: Secondary | ICD-10-CM

## 2023-08-29 ENCOUNTER — Ambulatory Visit: Payer: Self-pay | Admitting: Obstetrics & Gynecology

## 2023-08-31 ENCOUNTER — Other Ambulatory Visit: Payer: Self-pay | Admitting: Obstetrics & Gynecology

## 2023-08-31 DIAGNOSIS — Z01419 Encounter for gynecological examination (general) (routine) without abnormal findings: Secondary | ICD-10-CM

## 2023-08-31 DIAGNOSIS — N6452 Nipple discharge: Secondary | ICD-10-CM

## 2023-09-10 ENCOUNTER — Encounter

## 2023-09-10 ENCOUNTER — Other Ambulatory Visit

## 2023-09-14 ENCOUNTER — Encounter

## 2023-09-14 ENCOUNTER — Other Ambulatory Visit

## 2023-10-01 ENCOUNTER — Other Ambulatory Visit

## 2023-10-01 ENCOUNTER — Encounter

## 2023-10-18 ENCOUNTER — Encounter: Payer: Self-pay | Admitting: Obstetrics & Gynecology

## 2023-10-18 ENCOUNTER — Ambulatory Visit
Admission: RE | Admit: 2023-10-18 | Discharge: 2023-10-18 | Disposition: A | Discharge status: Home / Self Care | Source: Ambulatory Visit | Attending: Obstetrics & Gynecology | Admitting: Obstetrics & Gynecology

## 2023-10-18 DIAGNOSIS — N6452 Nipple discharge: Secondary | ICD-10-CM

## 2023-10-18 DIAGNOSIS — Z01419 Encounter for gynecological examination (general) (routine) without abnormal findings: Secondary | ICD-10-CM

## 2023-10-21 ENCOUNTER — Ambulatory Visit: Payer: Self-pay | Admitting: Obstetrics & Gynecology

## 2023-10-26 ENCOUNTER — Telehealth: Payer: Self-pay

## 2023-10-26 NOTE — Telephone Encounter (Signed)
 Called pt and confirmed, Pt reports No Family history of Ovarian Cancer

## 2023-10-26 NOTE — Telephone Encounter (Signed)
-----   Message from Burnard Pate sent at 10/21/2023  7:40 PM EDT ----- I sent the patient a note about her US .  Can you contact her and see if she has a history of ovarian cancer in her family.  I don't see that she replied to my patient message.   Thanks,  KHL

## 2023-11-25 ENCOUNTER — Encounter: Payer: Self-pay | Admitting: Obstetrics & Gynecology

## 2024-02-09 ENCOUNTER — Encounter: Payer: Self-pay | Admitting: Obstetrics & Gynecology

## 2024-02-09 ENCOUNTER — Telehealth: Payer: Self-pay

## 2024-02-09 ENCOUNTER — Other Ambulatory Visit: Payer: Self-pay

## 2024-02-09 DIAGNOSIS — N898 Other specified noninflammatory disorders of vagina: Secondary | ICD-10-CM

## 2024-02-09 MED ORDER — FLUCONAZOLE 150 MG PO TABS: 150.0000 mg | ORAL_TABLET | Freq: Once | ORAL | 0 refills | Status: AC

## 2024-02-09 NOTE — Telephone Encounter (Signed)
 RN received voicemail regarding need for Diflucan  due to having yeast symptoms in vagina and under her breast. RN reported had received approval from provider to send. Patient also requested Clobetasol  ointment refill. RN reported would speak with provider for request and get back with patient.   Silvano LELON Piano, RN
